# Patient Record
Sex: Female | Born: 1965 | Race: White | Hispanic: No | Smoking: Never smoker
Health system: Southern US, Community
[De-identification: ages and names within clinical notes are randomized; demographics above are authoritative.]

## PROBLEM LIST (undated history)

## (undated) DIAGNOSIS — I499 Cardiac arrhythmia, unspecified: Secondary | ICD-10-CM

## (undated) DIAGNOSIS — T4145XA Adverse effect of unspecified anesthetic, initial encounter: Secondary | ICD-10-CM

## (undated) DIAGNOSIS — G43909 Migraine, unspecified, not intractable, without status migrainosus: Secondary | ICD-10-CM

## (undated) DIAGNOSIS — M1990 Unspecified osteoarthritis, unspecified site: Secondary | ICD-10-CM

## (undated) DIAGNOSIS — J302 Other seasonal allergic rhinitis: Secondary | ICD-10-CM

## (undated) DIAGNOSIS — K219 Gastro-esophageal reflux disease without esophagitis: Secondary | ICD-10-CM

## (undated) DIAGNOSIS — B009 Herpesviral infection, unspecified: Secondary | ICD-10-CM

## (undated) DIAGNOSIS — Z8489 Family history of other specified conditions: Secondary | ICD-10-CM

## (undated) DIAGNOSIS — F419 Anxiety disorder, unspecified: Secondary | ICD-10-CM

## (undated) DIAGNOSIS — T8859XA Other complications of anesthesia, initial encounter: Secondary | ICD-10-CM

## (undated) HISTORY — PX: APPENDECTOMY: SHX54

## (undated) HISTORY — PX: OVARIAN CYST REMOVAL: SHX89

## (undated) HISTORY — DX: Herpesviral infection, unspecified: B00.9

## (undated) HISTORY — DX: Migraine, unspecified, not intractable, without status migrainosus: G43.909

## (undated) HISTORY — PX: COLONOSCOPY: SHX174

---

## 2011-06-09 ENCOUNTER — Encounter: Payer: Self-pay | Admitting: Family Medicine

## 2011-06-09 ENCOUNTER — Inpatient Hospital Stay (INDEPENDENT_AMBULATORY_CARE_PROVIDER_SITE_OTHER)
Admission: RE | Admit: 2011-06-09 | Discharge: 2011-06-09 | Disposition: A | Discharge status: Home / Self Care | Payer: PRIVATE HEALTH INSURANCE | Source: Ambulatory Visit | Attending: Family Medicine | Admitting: Family Medicine

## 2011-06-09 DIAGNOSIS — R1011 Right upper quadrant pain: Secondary | ICD-10-CM | POA: Insufficient documentation

## 2011-06-09 DIAGNOSIS — J45909 Unspecified asthma, uncomplicated: Secondary | ICD-10-CM | POA: Insufficient documentation

## 2011-06-09 DIAGNOSIS — K219 Gastro-esophageal reflux disease without esophagitis: Secondary | ICD-10-CM | POA: Insufficient documentation

## 2011-06-09 DIAGNOSIS — J309 Allergic rhinitis, unspecified: Secondary | ICD-10-CM | POA: Insufficient documentation

## 2011-06-09 LAB — CONVERTED CEMR LAB
Bilirubin Urine: NEGATIVE
Blood in Urine, dipstick: NEGATIVE
Glucose, Urine, Semiquant: NEGATIVE
Specific Gravity, Urine: >=1.03

## 2011-06-10 ENCOUNTER — Telehealth (INDEPENDENT_AMBULATORY_CARE_PROVIDER_SITE_OTHER): Payer: Self-pay | Admitting: Emergency Medicine

## 2011-11-15 NOTE — Letter (Signed)
Summary: Out of Work  [facility]  [address]   [place], [place] [address]   Phone: [phone]  Fax: [phone]    June 09, 2011   Employee:  [employer] [facility]    To Whom It May Concern:   For Medical reasons, please excuse the above named employee from work today and tomorrow.   If you need additional information, please feel free to contact our office.         Sincerely,    Donna Christen MD

## 2011-11-15 NOTE — Progress Notes (Signed)
Summary: RT SIDE PAIN (rm 5)   Vital Signs:  Patient Profile:   45 Years Old Female CC:      right upper abdominal pain x last night Height:     64 inches Weight:      169 pounds O2 Sat:      99 % O2 treatment:    Room Air Temp:     99.2 degrees F oral Pulse rate:   71 / minute Resp:     16 per minute BP sitting:   132 / 86  (left arm) Cuff size:   regular  Pt. in pain?   yes    Location:   right upper abdomen    Type:       sharp  Vitals Entered By: Lajean Saver RN (June 09, 2011 1:09 PM)                   Updated Prior Medication List: DAKINS (1/4 STRENGTH) 0.125 % SOLN (DAKINS) prn PROAIR HFA 108 (90 BASE) MCG/ACT AERS (ALBUTEROL SULFATE)  SINGULAIR 5 MG CHEW (MONTELUKAST SODIUM)  ACYCLOVIR 400 MG TABS (ACYCLOVIR) prn SERTRALINE HCL 25 MG TABS (SERTRALINE HCL)  OMEPRAZOLE 40 MG CPDR (OMEPRAZOLE) prn  Current Allergies: ! SULFAHistory of Present Illness Chief Complaint: right upper abdominal pain x last night History of Present Illness:  Subjective:  Patient complains of developing a brief episode of sharp right upper quadrant abdominal pain 2 days ago that resolved spontaneously.  At about 8-9PM last night after eating some fried fast food she developed recurrent sharp/stabbing right upper quadrant pain that lasted through the night.  The pain does not radiate, and is somewhat worse with movement and deep inspiration.  No nausea/vomiting.  Bowel movements have been loose over the past two days.  She has felt hot/cold during the past two days. She has a history of occasional GERD, improved with Prilosec, with symptoms distinctly different from present symptoms.  She denies cough or respiratory symptoms, and no GU symptoms.  REVIEW OF SYSTEMS Constitutional Symptoms      Denies fever, chills, night sweats, weight loss, weight gain, and fatigue.  Eyes       Denies change in vision, eye pain, eye discharge, glasses, contact lenses, and eye  surgery. Ear/Nose/Throat/Mouth       Denies hearing loss/aids, change in hearing, ear pain, ear discharge, dizziness, frequent runny nose, frequent nose bleeds, sinus problems, sore throat, hoarseness, and tooth pain or bleeding.  Respiratory       Complains of shortness of breath and asthma.      Denies dry cough, productive cough, wheezing, bronchitis, and emphysema/COPD.  Cardiovascular       Denies murmurs, chest pain, and tires easily with exhertion.    Gastrointestinal       Complains of stomach pain.      Denies nausea/vomiting, diarrhea, constipation, blood in bowel movements, and indigestion.      Comments: right sided pain Genitourniary       Denies painful urination, kidney stones, and loss of urinary control. Neurological       Denies paralysis, seizures, and fainting/blackouts. Musculoskeletal       Denies muscle pain, joint pain, joint stiffness, decreased range of motion, redness, swelling, muscle weakness, and gout.  Skin       Denies bruising, unusual mles/lumps or sores, and hair/skin or nail changes.  Psych       Complains of anxiety/stress.      Denies mood changes, temper/anger issues,  speech problems, depression, and sleep problems. Other Comments: Patient c/o RUQ abdominal pain x last night. The pain is relieved a little by  sitting up straight. C/o sharp stabbing pain and tingling   Past History:  Past Medical History: Allergic rhinitis Asthma GERD migraines  Past Surgical History: Appendectomy partial hysterectomy  Family History: Mom- breast CA Father- DM  Social History: Occupation: Airline pilot Never Smoked Alcohol use-no Drug use-no Smoking Status:  never Drug Use:  no   Objective:  No acute distress  Eyes:  Pupils are equal, round, and reactive to light and accomodation.  Extraocular movement is intact.  Conjunctivae are not inflamed.  Mouth/pharynx:  moist mucous membranes  Neck:  Supple.  No adenopathy is  present. Lungs:  Clear to auscultation.  Breath sounds are equal.  Heart:  Regular rate and rhythm without murmurs, rubs, or gallops.  Abdomen:  Tenderness in the right upper quadrant without masses or hepatosplenomegaly.  Murphy's sign is positive.  Bowel sounds are present.  No CVA or flank tenderness.  Extremities:  No edema.   Skin:  No rash urinalysis (dipstick): trace leuks CBC:  WBC 7.6 ; LY 29.4, MO 5.3, GR 65.3; Hgb 13.7  Assessment New Problems: RUQ PAIN (ICD-789.01) GERD (ICD-530.81) ASTHMA (ICD-493.90) ALLERGIC RHINITIS (ICD-477.9)  SUSPECT BILIARY COLIC  Plan New Orders: Urinalysis [CPT-81003] CBC w/Diff [[address]] T-CMP with estimated GFR [[address]] T-Amylase [82150-23210] T-Lipase [09811-91478] Ketorolac-Toradol 15mg  [J1885] Admin of Therapeutic Inj  intramuscular or subcutaneous [96372] New Patient Level IV [99204] Planning Comments:   Clear liquids for remainder of day then slowly advance diet Toradol 60mg  IM.  May contnue Ibuprofen at home. CMP, amylase, lipase pending. Schedule GB Ultrasound Return for worsening symptoms (or proceed to ER)   The patient and/or caregiver has been counseled thoroughly with regard to medications prescribed including dosage, schedule, interactions, rationale for use, and possible side effects and they verbalize understanding.  Diagnoses and expected course of recovery discussed and will return if not improved as expected or if the condition worsens. Patient and/or caregiver verbalized understanding.   Patient Instructions: 1)  May take Ibuprofen 200mg , 3 to 4 tabs every 8 hours with food for pain.  Medication Administration  Injection # 1:    Medication: Ketorolac-Toradol 15mg     Diagnosis: RUQ PAIN (ICD-789.01)    Route: IM    Site: RUOQ gluteus    Exp Date: 03/13/2013    Lot #: [id number]    Mfr: hospira    Comments: 60mg  given    Patient tolerated injection without complications    Given by: Lajean Saver RN  (June 09, 2011 2:15 PM)  Orders Added: 1)  Urinalysis [CPT-81003] 2)  CBC w/Diff [08657-84696] 3)  T-CMP with estimated GFR [[address]] 4)  T-Amylase [82150-23210] 5)  T-Lipase [83690-23215] 6)  Ketorolac-Toradol 15mg  [J1885] 7)  Admin of Therapeutic Inj  intramuscular or subcutaneous [96372] 8)  New Patient Level IV [99204]    Laboratory Results   Urine Tests  Date/Time Received: June 09, 2011 1:38 PM  Date/Time Reported: June 09, 2011 1:38 PM   Routine Urinalysis   Color: straw Appearance: Hazy Glucose: negative   (Normal Range: Negative) Bilirubin: negative   (Normal Range: Negative) Ketone: trace (5)   (Normal Range: Negative) Spec. Gravity: >=1.030   (Normal Range: 1.003-1.035) Blood: negative   (Normal Range: Negative) pH: 5.5   (Normal Range: 5.0-8.0) Protein: negative   (Normal Range: Negative) Urobilinogen: 0.2   (Normal Range: 0-1) Nitrite: negative   (Normal Range:  Negative) Leukocyte Esterace: trace   (Normal Range: Negative)        Appended Document: RT SIDE PAIN (rm 5) Abd U/S is normal.  No sludge, stones, thickening, or fluid.

## 2011-11-15 NOTE — Telephone Encounter (Signed)
  Phone Note Outgoing Call   Call placed by: Lavell Islam RN,  June 10, 2011 12:17 PM Call placed to: Patient Summary of Call: Left message on phone: inquiring about patient's status/ GB Ultrasound; encouraged to call us with any questions/concerns. Initial call taken by: Lavell Islam RN,  June 10, 2011 12:19 PM

## 2011-11-15 NOTE — Telephone Encounter (Signed)
  Phone Note Outgoing Call Call back at [facility] Phone [phone]   Call placed by: Emilio Math,  June 10, 2011 3:13 PM Call placed to: Patient Summary of Call: Called patient left message that Ultra sound scan was normal

## 2012-02-17 ENCOUNTER — Emergency Department (INDEPENDENT_AMBULATORY_CARE_PROVIDER_SITE_OTHER)
Admission: EM | Admit: 2012-02-17 | Discharge: 2012-02-17 | Disposition: A | Discharge status: Home Health | Payer: PRIVATE HEALTH INSURANCE | Source: Home / Self Care | Attending: Family Medicine | Admitting: Family Medicine

## 2012-02-17 ENCOUNTER — Encounter: Payer: Self-pay | Admitting: *Deleted

## 2012-02-17 DIAGNOSIS — M940 Chondrocostal junction syndrome [Tietze]: Secondary | ICD-10-CM

## 2012-02-17 DIAGNOSIS — J069 Acute upper respiratory infection, unspecified: Secondary | ICD-10-CM

## 2012-02-17 HISTORY — DX: Other seasonal allergic rhinitis: J30.2

## 2012-02-17 MED ORDER — MONTELUKAST SODIUM 10 MG PO TABS: 10.0000 mg | ORAL_TABLET | Freq: Every day | ORAL | Status: DC

## 2012-02-17 MED ORDER — BENZONATATE 200 MG PO CAPS: 200.0000 mg | ORAL_CAPSULE | Freq: Every day | ORAL | Status: AC

## 2012-02-17 MED ORDER — AZITHROMYCIN 250 MG PO TABS: ORAL_TABLET | ORAL | Status: AC

## 2012-02-17 NOTE — Discharge Instructions (Signed)
Take Mucinex D (guaifenesin with decongestant) twice daily for congestion.  Increase fluid intake, rest. May use Afrin nasal spray (or generic oxymetazoline) twice daily for about 5 days.  Also recommend using saline nasal spray several times daily and saline nasal irrigation (AYR is a common brand) Stop all antihistamines for now, and other non-prescription cough/cold preparations. May take Ibuprofen 200mg , 4 tabs every 8 hours with food for chest/sternum discomfort. Continue QVAR twice daily, and albuterol inhaler as needed.

## 2012-02-17 NOTE — ED Notes (Signed)
Pt c/o productive cough and "lungs burning" x 1 wk. She states that she is out of singulair. She has taken mucinex and tylenol multi s/s. She states that a zpak has helped in the past.

## 2012-02-17 NOTE — ED Provider Notes (Signed)
History     CSN: [id number]  Arrival date & time 02/17/12  1557   First MD Initiated Contact with Patient 02/17/12 1611      Chief Complaint  Patient presents with  . Cough     HPI Comments: Patient complains of approximately 7 day history of gradually progressive URI symptoms beginning with a mild cough followed by progressive nasal congestion, but no sore throat.  Complains of fatigue but no myalgias.  No fever but she has had chills.  She complains of tightness in her anterior chest.  Cough is now worse at night and generally non-productive during the day.  She complains of occasional shortness of breath and wheezing.  She sometimes coughs until she gags (she notes that her last tetanus shot has been within past 2 years).  She has asthma and has increased her QVAR to BID over the past several days.  She has a PRN albuterol MDI.  She ran out of Singulair about 2 months ago.  The history is provided by the patient.    Past Medical History  Diagnosis Date  . Asthma   . Seasonal allergies     Past Surgical History  Procedure Date  . Appendectomy   . Ovarian cyst removal     Family History  Problem Relation Age of Onset  . Asthma Other     History  Substance Use Topics  . Smoking status: Never Smoker   . Smokeless tobacco: Not on file  . Alcohol Use: No    OB History    Grav Para Term Preterm Abortions TAB SAB Ect Mult Living                  Review of Systems No sore throat + cough No pleuritic pain, but complains of tightness in anterior chest + occasional wheezing + nasal congestion + post-nasal drainage No sinus pain/pressure No itchy/red eyes + earache No hemoptysis + SOB with activity No fever, + chills No nausea No vomiting No abdominal pain No diarrhea No urinary symptoms No skin rashes + fatigue No myalgias + headache Used OTC meds without relief  Allergies  Sulfonamide derivatives  Home Medications   Current Outpatient Rx  Name Route  Sig Dispense Refill  . BECLOMETHASONE DIPROPIONATE 80 MCG/ACT IN AERS Inhalation Inhale 1 puff into the lungs as needed.    Marland Kitchen CETIRIZINE HCL 10 MG PO TABS Oral Take 10 mg by mouth daily.    . AZITHROMYCIN 250 MG PO TABS  Take 2 tabs today; then begin one tab once daily for 4 more days. 6 each 0  . BENZONATATE 200 MG PO CAPS Oral Take 1 capsule (200 mg total) by mouth at bedtime. Take as needed for cough 12 capsule 0  . MONTELUKAST SODIUM 10 MG PO TABS Oral Take 1 tablet (10 mg total) by mouth at bedtime. 30 tablet 0    BP 134/86  Pulse 65  Temp(Src) 98.6 F (37 C) (Oral)  Resp 16  Ht 5\' 4"  (1.626 m)  Wt 164 lb 8 oz (74.617 kg)  BMI 28.24 kg/m2  SpO2 97%  LMP 02/10/2012  Physical Exam Nursing notes and Vital Signs reviewed. Appearance:  Patient appears healthy, stated age, and in no acute distress Eyes:  Pupils are equal, round, and reactive to light and accomodation.  Extraocular movement is intact.  Conjunctivae are not inflamed  Ears:  Canals normal.  Tympanic membranes normal.  Nose:  Mildly congested turbinates.  No sinus tenderness.  Pharynx:  Normal Neck:  Supple.  Slightly tender shotty posterior nodes are palpated bilaterally  Lungs:  Clear to auscultation.  Breath sounds are equal.  Chest:  Distinct tenderness to palpation over the mid-sternum.  Heart:  Regular rate and rhythm without murmurs, rubs, or gallops.  Abdomen:  Nontender without masses or hepatosplenomegaly.  Bowel sounds are present.  No CVA or flank tenderness.  Extremities:  No edema.  No calf tenderness Skin:  No rash present.   ED Course  Procedures  none      1. Acute upper respiratory infections of unspecified site; possible bronchitis.  Pertussis less likely since her Tdap is current.  2. Costochondritis, acute       MDM  Begin a Z-pack, and Tessalon at bedtime.  Resume Singulair. Take Mucinex D (guaifenesin with decongestant) twice daily for congestion.  Increase fluid intake, rest. May  use Afrin nasal spray (or generic oxymetazoline) twice daily for about 5 days.  Also recommend using saline nasal spray several times daily and saline nasal irrigation (AYR is a common brand) Stop all antihistamines for now, and other non-prescription cough/cold preparations. May take Ibuprofen 200mg , 4 tabs every 8 hours with food for chest/sternum discomfort. Continue QVAR twice daily, and albuterol inhaler as needed. Given a Water quality scientist patient information and instruction sheet on topic costochondritis Followup with Family Doctor if not improved in one week.         Donna Christen, MD 02/17/12 [phone]

## 2012-10-20 ENCOUNTER — Ambulatory Visit (INDEPENDENT_AMBULATORY_CARE_PROVIDER_SITE_OTHER): Payer: PRIVATE HEALTH INSURANCE | Admitting: Physician Assistant

## 2012-10-20 ENCOUNTER — Encounter: Payer: Self-pay | Admitting: Physician Assistant

## 2012-10-20 VITALS — BP 117/63 | HR 72 | Temp 98.4°F | Ht 64.0 in | Wt 172.0 lb

## 2012-10-20 DIAGNOSIS — Z23 Encounter for immunization: Secondary | ICD-10-CM

## 2012-10-20 DIAGNOSIS — M25559 Pain in unspecified hip: Secondary | ICD-10-CM

## 2012-10-20 DIAGNOSIS — M2550 Pain in unspecified joint: Secondary | ICD-10-CM

## 2012-10-20 DIAGNOSIS — G43909 Migraine, unspecified, not intractable, without status migrainosus: Secondary | ICD-10-CM

## 2012-10-20 DIAGNOSIS — J329 Chronic sinusitis, unspecified: Secondary | ICD-10-CM

## 2012-10-20 DIAGNOSIS — B009 Herpesviral infection, unspecified: Secondary | ICD-10-CM

## 2012-10-20 MED ORDER — MELOXICAM 7.5 MG PO TABS: 7.5000 mg | ORAL_TABLET | Freq: Every day | ORAL | Status: DC

## 2012-10-20 MED ORDER — VALACYCLOVIR HCL 1 G PO TABS: ORAL_TABLET | ORAL | Status: DC

## 2012-10-20 MED ORDER — AMOXICILLIN-POT CLAVULANATE 875-125 MG PO TABS: 1.0000 | ORAL_TABLET | Freq: Two times a day (BID) | ORAL | Status: DC

## 2012-10-20 MED ORDER — ACYCLOVIR 5 % EX OINT: TOPICAL_OINTMENT | CUTANEOUS | Status: DC

## 2012-10-20 NOTE — Progress Notes (Signed)
Subjective:    Patient ID: Angela Oliver, female    DOB: 12-18-1965, 46 y.o.   MRN: [id number]  HPI Patient is a 46 yo female who presents to the clinic to establish care and follow up on ongoing health issues. PMH positive for asthma, allergies, herpes simplex with reccurrent outbreaks, Seasonal allergies, migraines, GERD.   Asthma is well controlled on Qvar 2 puffs twice a day. She rarely has to use rescue inhaler. Has not received Pneumonia or Flu vaccine.   Allergies are treated with Zyrtec. She has had sinus pressure and congestion for over 2 weeks. She feels a lot of pressure around eyes. She has tried treating allergies but not her throat hurts and glands feel tender. NO fever, chills, SOB, cough. Denies ear pain. She feels like she has had a continual headache for 2 weeks. She has tried OTC sinus/tylenol.   Herpes simplex: She has outbreak of herpes today on her upper lip. She had acyclovir but it did not help. She has been extremely stressed and not feeling well.   Right hip pain for about 2 weeks. She does not remember any trauma to the area. It hurts even to lay on her right side. She has never had anything like this before. She denies any back pain or radiation of pain down leg. NO numbness or tingling of extremities. She has not tried anything to make better. Walking and laying on it makes worse.   She has a previous diagnosis of plantar faciitis. She wore orthotics for years. She has not worn in a year or so. Both of her feet have a lot of pain espesically since she has had to walk more. She has been told she has heel spurs in the past also. She has been taking aleve and ibuprofen with some relief.   Polyarthralgia join pain of hands. She does have a family history of RA with her grandmother. The pain in her hands is worse in the morning and gets better throughout the day. Her grip is even affected. She feels stiff. Never been checked for RA.    Review of Systems  All other  systems reviewed and are negative.       Objective:   Physical Exam  Constitutional: She is oriented to person, place, and time. She appears well-developed and well-nourished.       Overweight.  HENT:  Head: Normocephalic and atraumatic.  Right Ear: External ear normal.  Left Ear: External ear normal.       TM's normal bilaterally.   Oropharynx erythematous with no swelling. Nasal turbinates red and swollen.  Maxillary tenderness over maxillary sinuses.   Eyes: Conjunctivae normal are normal. Right eye exhibits no discharge. Left eye exhibits no discharge.  Neck: Normal range of motion. Neck supple.       Bilateral tender cervical adenopathy that is tender to touch.   Cardiovascular: Normal rate, regular rhythm, normal heart sounds and intact distal pulses.   Pulmonary/Chest: Effort normal and breath sounds normal. She has no wheezes.  Musculoskeletal:       Bilateral hands: Positive squeeze test with left hand more positive than right. No single joint swelling but all the MCP and PIP joints appear to be swollen. PIP joints are tender to touch with palpation. Strength is still 5/5.    Bilateral Feet: Pain with standing on flat feet. Tenderness with palpation over heel of both feet and on lateral side of left foot. Pedal pulses 2+. No color changes, signs of  swelling or bruising. No pain with active plantar or doral flexion and extension.    Right hip: ROM limited by pain with internal rotation and abduction. Pinpoint tenderness over right bursa. No swelling or erythema. Negative straight leg test. Strength 5/5.   Lymphadenopathy:    She has cervical adenopathy.  Neurological: She is alert and oriented to person, place, and time. She has normal reflexes. Coordination normal.  Skin: Skin is warm and dry.       Swollen upper lip with crusted lesions with minimal oozing discharge.  Psychiatric: She has a normal mood and affect. Her behavior is normal.          Assessment &  Plan:  Right hip bursitis-  Aspiration/Injection Procedure Note Angela Oliver [id number] Aug 27, 1966  Procedure: Injection Indications: right hip bursa pain.  Procedure Details Consent: Risks of procedure as well as the alternatives and risks of each were explained to the (patient/caregiver).  Consent for procedure obtained. Time Out: Verified patient identification, verified procedure, site/side was marked, verified correct patient position, special equipment/implants available, medications/allergies/relevent history reviewed, required imaging and test results available.  Performed   Local Anesthesia Used:Ethyl Chloride Spray Area prepped with beta iodine. Injection of lidocaine 1% and depo medrol 40mg  was administered to the right hip bursa.  A sterile dressing was applied.  Patient did tolerate procedure well. Estimated blood loss: none  Patient also given stretches and symptomatic care handout. Mobic will also help with this along with icing area. Follow up if not improving.   Sinusitis- Augmentin given for 10 days. Symptomatic care discussed. Follow up if not improving.   Plantar fasciitis- Gave patient handout with stretches and home treatment. Also gave mobic to use for 2 weeks and then PRN for all joint pain. Discussed icing area with frozen water bottle. Good foot support is very important patient has had orthotics in the past. I will get her in with Dr. Karie Schwalbe and he can get her more orthotics. In the meantime encouraged patient to wear very supportive shoes.   Polyarthralgia/joint pain- Joint pain localized in hands will get hand x-rays. Clinical hx sounds suspicious of RA. Will test for factor and anti-CCP. In the mean time lets start with symptomatic care with anti-inflammatories. I discussed with patient that we would start one joint at a time to take care of pain as best we could.   Herpes simplex/Cold sores- Since acyclovir not working gave Valtrex high dose to knock out for 1 day.  Also gave Zovirax cream to use for next 2 days. Pt educated to stop acyclovir before starting Valtrex. Discussed preventive care if continues to reoccur. Patient will think about this and see if that is something she is interested int.   Asthma- Well-controlled currently never had pneumonia injection was given today with no complications.   Flu shot was also given with no complications.   Needs mammogram and other health manitence blood work today but we follow up and will get labs order after we address acute problems.     60 minutes was spent with patient and greater than 60% was spent with patient going over plan to coordinate care.

## 2012-10-20 NOTE — Patient Instructions (Addendum)
Follow up in 2 weeks. Will refer for consult with Dr. Karie Schwalbe for orthotics/plantar faciitis/right hip bursitis. Start mobic 7.5mg  daily and may increase to 2 if no relief after 2 weeks can use as needed. See stretches.   Plantar Fasciitis Plantar fasciitis is a common condition that causes foot pain. It is soreness (inflammation) of the band of tough fibrous tissue on the bottom of the foot that runs from the heel bone (calcaneus) to the ball of the foot. The cause of this soreness may be from excessive standing, poor fitting shoes, running on hard surfaces, being overweight, having an abnormal walk, or overuse (this is common in runners) of the painful foot or feet. It is also common in aerobic exercise dancers and ballet dancers. SYMPTOMS  Most people with plantar fasciitis complain of:  Severe pain in the morning on the bottom of their foot especially when taking the first steps out of bed. This pain recedes after a few minutes of walking.  Severe pain is experienced also during walking following a long period of inactivity.  Pain is worse when walking barefoot or up stairs DIAGNOSIS   Your caregiver will diagnose this condition by examining and feeling your foot.  Special tests such as X-rays of your foot, are usually not needed. PREVENTION   Consult a sports medicine professional before beginning a new exercise program.  Walking programs offer a good workout. With walking there is a lower chance of overuse injuries common to runners. There is less impact and less jarring of the joints.  Begin all new exercise programs slowly. If problems or pain develop, decrease the amount of time or distance until you are at a comfortable level.  Wear good shoes and replace them regularly.  Stretch your foot and the heel cords at the back of the ankle (Achilles tendon) both before and after exercise.  Run or exercise on even surfaces that are not hard. For example, asphalt is better than  pavement.  Do not run barefoot on hard surfaces.  If using a treadmill, vary the incline.  Do not continue to workout if you have foot or joint problems. Seek professional help if they do not improve. HOME CARE INSTRUCTIONS   Avoid activities that cause you pain until you recover.  Use ice or cold packs on the problem or painful areas after working out.  Only take over-the-counter or prescription medicines for pain, discomfort, or fever as directed by your caregiver.  Soft shoe inserts or athletic shoes with air or gel sole cushions may be helpful.  If problems continue or become more severe, consult a sports medicine caregiver or your own health care provider. Cortisone is a potent anti-inflammatory medication that may be injected into the painful area. You can discuss this treatment with your caregiver. MAKE SURE YOU:   Understand these instructions.  Will watch your condition.  Will get help right away if you are not doing well or get worse. Document Released: 08/24/2001 Document Revised: 02/21/2012 Document Reviewed: 10/23/2008 [facility] Patient Information 2013 [place], [place].

## 2012-10-23 DIAGNOSIS — G43909 Migraine, unspecified, not intractable, without status migrainosus: Secondary | ICD-10-CM | POA: Insufficient documentation

## 2012-10-23 DIAGNOSIS — B001 Herpesviral vesicular dermatitis: Secondary | ICD-10-CM | POA: Insufficient documentation

## 2012-10-23 DIAGNOSIS — B009 Herpesviral infection, unspecified: Secondary | ICD-10-CM | POA: Insufficient documentation

## 2012-10-23 LAB — RHEUMATOID FACTOR: Rhuematoid fact SerPl-aCnc: <10 IU/mL (ref <=14)

## 2012-11-03 ENCOUNTER — Other Ambulatory Visit: Payer: Self-pay | Admitting: Physician Assistant

## 2012-11-03 ENCOUNTER — Encounter: Payer: Self-pay | Admitting: Sports Medicine

## 2012-11-03 ENCOUNTER — Ambulatory Visit (INDEPENDENT_AMBULATORY_CARE_PROVIDER_SITE_OTHER): Payer: PRIVATE HEALTH INSURANCE

## 2012-11-03 ENCOUNTER — Other Ambulatory Visit: Payer: Self-pay | Admitting: Sports Medicine

## 2012-11-03 ENCOUNTER — Ambulatory Visit (INDEPENDENT_AMBULATORY_CARE_PROVIDER_SITE_OTHER): Payer: PRIVATE HEALTH INSURANCE | Admitting: Sports Medicine

## 2012-11-03 VITALS — BP 122/80 | HR 75 | Wt 173.0 lb

## 2012-11-03 DIAGNOSIS — M7060 Trochanteric bursitis, unspecified hip: Secondary | ICD-10-CM

## 2012-11-03 DIAGNOSIS — M7061 Trochanteric bursitis, right hip: Secondary | ICD-10-CM | POA: Insufficient documentation

## 2012-11-03 DIAGNOSIS — R269 Unspecified abnormalities of gait and mobility: Secondary | ICD-10-CM

## 2012-11-03 DIAGNOSIS — M722 Plantar fascial fibromatosis: Secondary | ICD-10-CM | POA: Insufficient documentation

## 2012-11-03 DIAGNOSIS — R609 Edema, unspecified: Secondary | ICD-10-CM

## 2012-11-03 DIAGNOSIS — M25649 Stiffness of unspecified hand, not elsewhere classified: Secondary | ICD-10-CM

## 2012-11-03 DIAGNOSIS — M2550 Pain in unspecified joint: Secondary | ICD-10-CM

## 2012-11-03 DIAGNOSIS — M62838 Other muscle spasm: Secondary | ICD-10-CM | POA: Insufficient documentation

## 2012-11-03 DIAGNOSIS — M76899 Other specified enthesopathies of unspecified lower limb, excluding foot: Secondary | ICD-10-CM

## 2012-11-03 MED ORDER — CYCLOBENZAPRINE HCL 10 MG PO TABS: ORAL_TABLET | ORAL | Status: DC

## 2012-11-03 NOTE — Progress Notes (Signed)
SPORTS MEDICINE CONSULTATION REPORT  Subjective:    I'm seeing this patient as a consultation for:  Tandy Gaw, PA-C  CC: Multiple complaints  HPI: Foot pain: Present for a long time, localized to the heel insertion of the plantar fascia, particularly of the left foot. Worse with the first few steps in the morning, gets better through the day. It is localized, doesn't radiate, is severe. She did have some orthotics in the past of worn out, and desires an additional set.  Trochanteric bursitis: Injected by Lesly Rubenstein 2 weeks ago, symptoms have resolved on the right side, are mild on the left side. She's not doing any form of rehabilitation exercises yet for this.  Neck pain: Stretched, and developed some spasm and pain along the midline of her paracervical muscles. Any movement worsens the pain, she does not get any radicular symptoms. It is severe. Localized, does not radiate  Past medical history, Surgical history, Family history, Social history, Allergies, and medications have been entered into the medical record, reviewed, and no changes needed.   Review of Systems: No headache, visual changes, nausea, vomiting, diarrhea, constipation, dizziness, abdominal pain, skin rash, fevers, chills, night sweats, weight loss, swollen lymph nodes, body aches, joint swelling, muscle aches, chest pain, or shortness of breath.   Objective:   Vitals:  Afebrile, vital signs stable. General: Well Developed, well nourished, and in no acute distress.  Neuro/Psych: Alert and oriented x3, extra-ocular muscles intact, able to move all 4 extremities.  Skin: Warm and dry, no rashes noted.  Respiratory: Not using accessory muscles, speaking in full sentences, trachea midline.  Cardiovascular: Pulses palpable, no extremity edema. Abdomen: Does not appear distended. Neck: Inspection unremarkable. No palpable stepoffs. Tender to palpation along the right paracervical muscles. Negative Spurling's maneuver. Full  neck range of motion Grip strength and sensation normal in bilateral hands Strength good C4 to T1 distribution No sensory change to C4 to T1 Negative Hoffman sign bilaterally Reflexes normal  Bilateral Hip: ROM IR: 45 Deg, ER: 45 Deg, Flexion: 120 Deg, Extension: 100 Deg, Abduction: 45 Deg, Adduction: 45 Deg Strength IR: 5/5, ER: 5/5, Flexion: 5/5, Extension: 5/5, Abduction: 5/5, Adduction: 5/5 Pelvic alignment unremarkable to inspection and palpation. Standing hip rotation and gait without trendelenburg sign / unsteadiness. Tenderness to palpation over right and left greater trochanters. No pain with FABER or FADIR. No SI joint tenderness and normal minimal SI movement.  Bilateral pes planus. Leg lengths equal. Hip abductor strength markedly weak on the right side, normal on the left. Tender to palpation the calcaneal insertion of plantar fascia on the left foot.  Patient was fitted for a : standard, cushioned, semi-rigid orthotic. The orthotic was heated and afterward the patient stood on the orthotic blank positioned on the orthotic stand. The patient was positioned in subtalar neutral position and 10 degrees of ankle dorsiflexion in a weight bearing stance. After completion of molding, a stable base was applied to the orthotic blank. The blank was ground to a stable position for weight bearing. Size: 8 Base: Blue EVA Additional Posting and Padding: None The patient ambulated these, and they were very comfortable.  Impression and Recommendations:   This case required medical decision making of moderate complexity.

## 2012-11-03 NOTE — Assessment & Plan Note (Signed)
Increase Mobic to 15 mg daily. Neck rehabilitation. Short course of Flexeril.

## 2012-11-03 NOTE — Assessment & Plan Note (Signed)
Aggressive home rehabilitation program, handout given. Orthotics made today. Consider ultrasound guided injection if no better.

## 2012-11-03 NOTE — Assessment & Plan Note (Signed)
At 15 mg daily. Hip abductor rehabilitation

## 2012-11-03 NOTE — Patient Instructions (Signed)
Hip Rehabilitation Protocol:  1.  Side leg raises.  3x30 with no weight, then 3x15 with 2 lb ankle weight, then 3x15 with 5 lb ankle weight 2.  Standing hip rotation.  3x30 with no weight, then 3x15 with 2 lb ankle weight, then 3x15 with 5 lb ankle weight. 3.  Side step ups.  3x30 with no weight, then 3x15 with 5 lbs in backpack, then 3x15 with 10 lbs in backpack. 

## 2012-11-27 ENCOUNTER — Telehealth: Payer: Self-pay | Admitting: Family Medicine

## 2012-11-27 MED ORDER — SERTRALINE HCL 25 MG PO TABS: 25.0000 mg | ORAL_TABLET | Freq: Every day | ORAL | Status: DC

## 2012-11-27 MED ORDER — MONTELUKAST SODIUM 10 MG PO TABS: 10.0000 mg | ORAL_TABLET | Freq: Every day | ORAL | Status: DC

## 2012-11-27 MED ORDER — SUMATRIPTAN SUCCINATE 50 MG PO TABS: 50.0000 mg | ORAL_TABLET | ORAL | Status: DC | PRN

## 2012-11-27 MED ORDER — MELOXICAM 7.5 MG PO TABS: 15.0000 mg | ORAL_TABLET | Freq: Every day | ORAL | Status: DC

## 2012-11-27 NOTE — Telephone Encounter (Signed)
Patient walked-in request to have meds refilled. Montelukast SOD 10 Mg, Sumatriptan SUCC 50 Mg, Sertraline HCL 25mg , Meloxicam 7.5 mg (Pt adv that Dr. Karie Schwalbe had her double up this med) and Butalbit-Acetminophen-Caff cp

## 2012-11-27 NOTE — Telephone Encounter (Signed)
Sent over all rx except butabital/acetiminophen/Caffiene. Have I ever given this to you? Where have you gotten in the past?

## 2012-11-27 NOTE — Telephone Encounter (Signed)
[facility] on [address]

## 2012-11-28 MED ORDER — BUTALBITAL-ASA-CAFFEINE 50-325-40 MG PO CAPS: 1.0000 | ORAL_CAPSULE | Freq: Two times a day (BID) | ORAL | Status: DC | PRN

## 2012-11-28 NOTE — Telephone Encounter (Signed)
Pt notified and instructed to call back as to where she was getting the butalbital from

## 2012-11-28 NOTE — Addendum Note (Signed)
Addended by: Nani Gasser D on: 11/28/2012 05:03 PM   Modules accepted: Orders

## 2012-11-28 NOTE — Telephone Encounter (Signed)
She received the butalbital originally from her last primary doctor, Dr Tinnie Gens. She takes the medication at the onset of a migraine.

## 2012-11-28 NOTE — Telephone Encounter (Signed)
rx sent fiorinal.

## 2012-11-29 ENCOUNTER — Other Ambulatory Visit: Payer: Self-pay | Admitting: *Deleted

## 2012-12-01 ENCOUNTER — Encounter: Payer: Self-pay | Admitting: Sports Medicine

## 2012-12-01 ENCOUNTER — Ambulatory Visit (INDEPENDENT_AMBULATORY_CARE_PROVIDER_SITE_OTHER): Payer: PRIVATE HEALTH INSURANCE | Admitting: Sports Medicine

## 2012-12-01 VITALS — BP 128/82 | HR 86 | Wt 174.0 lb

## 2012-12-01 DIAGNOSIS — M722 Plantar fascial fibromatosis: Secondary | ICD-10-CM

## 2012-12-01 MED ORDER — MELOXICAM 15 MG PO TABS: 15.0000 mg | ORAL_TABLET | Freq: Every day | ORAL | Status: DC

## 2012-12-01 NOTE — Progress Notes (Signed)
SPORTS MEDICINE CONSULTATION REPORT  Subjective:    CC: Followup  HPI: Plantar fasciitis: Bilateral, worse on the left side. She has been doing on an intermittent basis the home rehabilitation exercises. I also built her custom orthotics.tion exercises,overall she is better on the right side but still with symptoms on the left. Pain is localized to the calcaneal insertion of the plantar fascia medially, worse in the morning, better with rest. Symptoms are severe.   Past medical history, Surgical history, Family history, Social history, Allergies, and medications have been entered into the medical record, reviewed, and no changes needed.   Review of Systems: No headache, visual changes, nausea, vomiting, diarrhea, constipation, dizziness, abdominal pain, skin rash, fevers, chills, night sweats, weight loss, swollen lymph nodes, body aches, joint swelling, muscle aches, chest pain, shortness of breath, mood changes, visual or auditory hallucinations.   Objective:   Vitals:  Afebrile, vital signs stable. General: Well Developed, well nourished, and in no acute distress.  Neuro/Psych: Alert and oriented x3, extra-ocular muscles intact, able to move all 4 extremities.  Skin: Warm and dry, no rashes noted.  Respiratory: Not using accessory muscles, speaking in full sentences, trachea midline.  Cardiovascular: Pulses palpable, no extremity edema. Abdomen: Does not appear distended. Orthotics are in good shape, she continues to be tender to palpation at the medial calcaneal insertion of the plantar fascia on the left foot.   Procedure: Real-time Ultrasound Guided Injection of left plantar fascia Device: GE Logiq E  Ultrasound guided injection is preferred based studies that show increased duration, increased effect, greater accuracy, decreased procedural pain, increased response rate, and decreased cost with ultrasound guided versus blind injection.  Verbal informed consent obtained.  Time-out  conducted.  Noted no overlying erythema, induration, or other signs of local infection.  Skin prepped in a sterile fashion.  Local anesthesia: Topical Ethyl chloride.  With sterile technique and under real time ultrasound guidance:  Needle advanced and real-time guidance just deep to the calcaneal insertion of the plantar fascia. 1 cc Kenalog 40, 3 cc lidocaine injected. Completed without difficulty  Pain immediately resolved suggesting accurate placement of the medication.  Advised to call if fevers/chills, erythema, induration, drainage, or persistent bleeding.  Images permanently stored and available for review in the ultrasound unit.  Impression: Technically successful ultrasound guided injection.  Air heel brace placed in left side..  Impression and Recommendations:   This case required medical decision making of moderate complexity.

## 2012-12-01 NOTE — Assessment & Plan Note (Signed)
Right side improved significantly with orthotics and rehabilitation exercises. Left side still painful, ultrasound guided injection as above. Continue exercises, orthotics, and back to see me in 4 weeks.

## 2012-12-29 ENCOUNTER — Encounter: Payer: Self-pay | Admitting: Sports Medicine

## 2012-12-29 ENCOUNTER — Ambulatory Visit (INDEPENDENT_AMBULATORY_CARE_PROVIDER_SITE_OTHER): Payer: PRIVATE HEALTH INSURANCE | Admitting: Sports Medicine

## 2012-12-29 VITALS — BP 136/93 | HR 84 | Wt 178.0 lb

## 2012-12-29 DIAGNOSIS — M722 Plantar fascial fibromatosis: Secondary | ICD-10-CM

## 2012-12-29 DIAGNOSIS — M7750 Other enthesopathy of unspecified foot: Secondary | ICD-10-CM

## 2012-12-29 DIAGNOSIS — M7672 Peroneal tendinitis, left leg: Secondary | ICD-10-CM | POA: Insufficient documentation

## 2012-12-29 NOTE — Assessment & Plan Note (Signed)
Guided injection as above. Home rehabilitation. Continue Mobic as needed. Return to clinic in 4 weeks to reassess.

## 2012-12-29 NOTE — Progress Notes (Signed)
SPORTS MEDICINE CONSULTATION REPORT  Subjective:    CC: Follow  HPI: Plantar fasciitis: Resolved on the right side, still present on the left side. She has already had rehabilitation exercises, ultrasound guided injection, custom orthotics. Overall is better but the pain is still present. She does desire to have some orthotics made with a digital mold. I think that this is appropriate.  Left Ankle pain: She now localizes this for several weeks behind and distal to the lateral malleolus. Worse with eversion of the foot. Moderate. She desires that I injected this today.  Past medical history, Surgical history, Family history not pertinant except as noted below, Social history, Allergies, and medications have been entered into the medical record, reviewed, and no changes needed.   Review of Systems: No headache, visual changes, nausea, vomiting, diarrhea, constipation, dizziness, abdominal pain, skin rash, fevers, chills, night sweats, weight loss, swollen lymph nodes, body aches, joint swelling, muscle aches, chest pain, shortness of breath, mood changes, visual or auditory hallucinations.   Objective:   Vitals:  Afebrile, vital signs stable. General: Well Developed, well nourished, and in no acute distress.  Neuro/Psych: Alert and oriented x3, extra-ocular muscles intact, able to move all 4 extremities, sensation grossly intact. Skin: Warm and dry, no rashes noted.  Respiratory: Not using accessory muscles, speaking in full sentences, trachea midline.  Cardiovascular: Pulses palpable, no extremity edema. Abdomen: Does not appear distended. Left Ankle: No visible erythema or swelling. Range of motion is full in all directions. Strength is 5/5 in all directions. Stable lateral and medial ligaments; squeeze test and kleiger test unremarkable; Talar dome nontender; No pain at base of 5th MT; No tenderness over cuboid; No tenderness over N spot or navicular prominence Tender to palpation  behind the lateral malleolus, and along the peroneal tendon, predominately the peroneus brevis. Pain with resisted eversion. Negative tarsal tunnel Tinel's. 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 Able to walk 4 steps.  Procedure: Real-time Ultrasound Guided Injection of peroneal tendon sheath. Device: GE Logiq E  Ultrasound guided injection is preferred based studies that show increased duration, increased effect, greater accuracy, decreased procedural pain, increased response rate, and decreased cost with ultrasound guided versus blind injection.  Verbal informed consent obtained.  Time-out conducted.  Noted no overlying erythema, induration, or other signs of local infection.  Skin prepped in a sterile fashion.  Local anesthesia: Topical Ethyl chloride.  With sterile technique and under real time ultrasound guidance:  Needle advanced and short axis between the peroneus longus and brevis just distal to the lateral malleolus. 1 cc Kenalog 40, 3 cc lidocaine injected easily and the tendon sheath was seen filling with medication. Completed without difficulty  Pain immediately resolved suggesting accurate placement of the medication.  Advised to call if fevers/chills, erythema, induration, drainage, or persistent bleeding.  Images permanently stored and available for review in the ultrasound unit.  Impression: Technically successful ultrasound guided injection.  Impression and Recommendations:   This case required medical decision making of moderate complexity.

## 2012-12-29 NOTE — Assessment & Plan Note (Addendum)
Improved, left side still painful status post injection, ear heel brace, and custom orthotics. I think it appropriate for her to see a CPO for consideration of digital orthotics. I have also recommended she obtain an arch bandage over the counter which is highly effective in supporting the mid-plantar fascia. She should also perform massage over the mid plantar fascia over the fibromatosis. Unfortunately I think she will eventually need surgical intervention.

## 2013-01-01 ENCOUNTER — Ambulatory Visit (INDEPENDENT_AMBULATORY_CARE_PROVIDER_SITE_OTHER): Payer: PRIVATE HEALTH INSURANCE

## 2013-01-01 ENCOUNTER — Ambulatory Visit: Payer: PRIVATE HEALTH INSURANCE

## 2013-01-01 ENCOUNTER — Other Ambulatory Visit: Payer: Self-pay | Admitting: Family Medicine

## 2013-01-01 ENCOUNTER — Other Ambulatory Visit: Payer: PRIVATE HEALTH INSURANCE

## 2013-01-01 ENCOUNTER — Ambulatory Visit (INDEPENDENT_AMBULATORY_CARE_PROVIDER_SITE_OTHER): Payer: PRIVATE HEALTH INSURANCE | Admitting: Family Medicine

## 2013-01-01 ENCOUNTER — Encounter: Payer: Self-pay | Admitting: Family Medicine

## 2013-01-01 VITALS — BP 126/81 | HR 76 | Resp 18 | Wt 179.0 lb

## 2013-01-01 DIAGNOSIS — M546 Pain in thoracic spine: Secondary | ICD-10-CM

## 2013-01-01 MED ORDER — CYCLOBENZAPRINE HCL 10 MG PO TABS: 5.0000 mg | ORAL_TABLET | Freq: Every evening | ORAL | Status: DC | PRN

## 2013-01-01 NOTE — Progress Notes (Signed)
Subjective:    Patient ID: Angela Oliver, female    DOB: 03-25-1966, 47 y.o.   MRN: [id number]  HPI Upper Back Pain x 3 moths.  Says has always has had a sensitivity in that area. Does do a lot of heavy lifting for her job.  Says feels so painful as times feels like something is going to break.  Painful ot sleep on her side, makes it worse.  Pillow can make it worse.  Feels like a hot poker into her skin.  Now when bend over and tie her shoes she feels like it takes her breath away. Last xrays were in 1995.  Sometimes arms will go numb and tiingle when asleep at night but not during the daytime. Has a lot fo tension in her upper back.  Friend will sometimes massage the muscle but says the bone is tender.  No old injuries. Mobic helped in the beginning but not really now.  Has been using ice and heat on it.    She is also here to establish care with me today. She was initially seen by Tandy Gaw, PA. She wanted to see me for her primary care provider since I do see her parents. They were unable to get her in with me initially, so she is now establishing with me.   Review of Systems BP 126/81  Pulse 76  Resp 18  Wt 179 lb (81.194 kg)  SpO2 97%    Allergies  Allergen Reactions  . Sulfonamide Derivatives     Past Medical History  Diagnosis Date  . Asthma   . Seasonal allergies   . Herpes simplex   . Migraines     Past Surgical History  Procedure Date  . Appendectomy   . Ovarian cyst removal     History   Social History  . Marital Status: Married    Spouse Name: N/A    Number of Children: N/A  . Years of Education: N/A   Occupational History  . Not on file.   Social History Main Topics  . Smoking status: Never Smoker   . Smokeless tobacco: Not on file  . Alcohol Use: No  . Drug Use: No  . Sexually Active:    Other Topics Concern  . Not on file   Social History Narrative  . No narrative on file    Family History  Problem Relation Age of Onset  . Asthma Other    . Cancer Mother     breast cancer  . Alcohol abuse Father   . Diabetes Father   . Hyperlipidemia Father     Outpatient Encounter Prescriptions as of 01/01/2013  Medication Sig Dispense Refill  . acyclovir (ZOVIRAX) 400 MG tablet Take 400 mg by mouth 2 (two) times daily.      Marland Kitchen acyclovir ointment (ZOVIRAX) 5 % Apply topically every 3 (three) hours.  15 g  1  . albuterol (PROVENTIL HFA;VENTOLIN HFA) 108 (90 BASE) MCG/ACT inhaler Inhale 2 puffs into the lungs as needed.      . beclomethasone (QVAR) 80 MCG/ACT inhaler Inhale 2 puffs into the lungs 2 (two) times daily.       . butalbital-aspirin-caffeine (FIORINAL) 50-325-40 MG per capsule Take 1 capsule by mouth 2 (two) times daily as needed for headache.  14 capsule  0  . cetirizine (ZYRTEC) 10 MG tablet Take 10 mg by mouth daily.      . cyclobenzaprine (FLEXERIL) 10 MG tablet One half tab PO qHS, then increase gradually  to one tab TID.  30 tablet  0  . levonorgestrel-ethinyl estradiol (NORDETTE) 0.15-30 MG-MCG tablet Take 1 tablet by mouth daily.      . meloxicam (MOBIC) 15 MG tablet Take 1 tablet (15 mg total) by mouth daily. Take 1-2 tabs everyday for 2 weeks then as needed for joint pain.  90 tablet  3  . montelukast (SINGULAIR) 10 MG tablet Take 1 tablet (10 mg total) by mouth at bedtime.  30 tablet  11  . sertraline (ZOLOFT) 25 MG tablet Take 1 tablet (25 mg total) by mouth daily.  30 tablet  3  . SUMAtriptan (IMITREX) 50 MG tablet Take 1 tablet (50 mg total) by mouth every 2 (two) hours as needed.  10 tablet  1  . valACYclovir (VALTREX) 1000 MG tablet 2 tabs twice a day for one day.  4 tablet  0  . cyclobenzaprine (FLEXERIL) 10 MG tablet Take 0.5-1 tablets (5-10 mg total) by mouth at bedtime as needed for muscle spasms.  30 tablet  0  . [DISCONTINUED] amoxicillin-clavulanate (AUGMENTIN) 875-125 MG per tablet Take 1 tablet by mouth 2 (two) times daily. For 10 days.  20 tablet  0          Objective:   Physical Exam  Constitutional:  She is oriented to person, place, and time. She appears well-developed and well-nourished.  HENT:  Head: Normocephalic and atraumatic.  Musculoskeletal:       NOrmal flexion of the neck, normal extension, dec flexion to the right, and dec side bending to the right. Nontender over the cervical spine. Upper shin knees with normal range of motion. Normal crossover. She said her touch her back. Shoulder, elbow, wrist, finger strength is 5 out of 5 in both extremities. The she may reflexes 2+ bilaterally.  Neurological: She is alert and oriented to person, place, and time. She has normal reflexes.  Skin: Skin is warm and dry.  Psychiatric: She has a normal mood and affect. Her behavior is normal.          Assessment & Plan:  Upper back pain - Will tx with NSAID, muscle relaxer, PT.  Will also get xrays to eval the thoracic spine, since  has bony tenderness over the midthoracic spine. We'll call her with results. Consider that she may also have some disc herniation. if her x-rays are fairly normal or only shows some mild arthritis then recommend physical therapy and if not improving then consider further imaging such as MRI. She does do heavy lifting in her job and this may continue to be problematic for her in her ability to heal and recover.

## 2013-01-03 ENCOUNTER — Encounter: Payer: Self-pay | Admitting: *Deleted

## 2013-01-25 ENCOUNTER — Ambulatory Visit: Payer: PRIVATE HEALTH INSURANCE | Admitting: Sports Medicine

## 2013-01-30 ENCOUNTER — Ambulatory Visit: Payer: PRIVATE HEALTH INSURANCE | Admitting: Family Medicine

## 2013-01-30 DIAGNOSIS — Z0289 Encounter for other administrative examinations: Secondary | ICD-10-CM

## 2013-02-17 ENCOUNTER — Other Ambulatory Visit: Payer: Self-pay | Admitting: Family Medicine

## 2013-03-02 ENCOUNTER — Encounter: Payer: Self-pay | Admitting: Sports Medicine

## 2013-03-02 ENCOUNTER — Ambulatory Visit (INDEPENDENT_AMBULATORY_CARE_PROVIDER_SITE_OTHER): Payer: PRIVATE HEALTH INSURANCE | Admitting: Sports Medicine

## 2013-03-02 VITALS — BP 130/83 | HR 68 | Wt 180.0 lb

## 2013-03-02 DIAGNOSIS — M7672 Peroneal tendinitis, left leg: Secondary | ICD-10-CM

## 2013-03-02 DIAGNOSIS — M722 Plantar fascial fibromatosis: Secondary | ICD-10-CM

## 2013-03-02 NOTE — Assessment & Plan Note (Signed)
Resolved after guided sheath injection.

## 2013-03-02 NOTE — Assessment & Plan Note (Addendum)
Left plantar fascia injection as above. Patient desires digitally created orthotics, I have placed a referral to orthotist that she has selected.

## 2013-03-02 NOTE — Progress Notes (Signed)
  Subjective:    CC: Left heel pain  HPI:  Angela Oliver comes back, she has a recurrence of pain which he localizes over her left calcaneus. I injected her left plantar fascia 3 months ago, and her left peroneal tendon sheath 2 months ago. Peroneal symptoms are resolved. She desires a repeat injection, he continues to be localizes above without radiation, severe. Worse in the morning.  Past medical history, Surgical history, Family history not pertinant except as noted below, Social history, Allergies, and medications have been entered into the medical record, reviewed, and no changes needed.   Review of Systems: No headache, visual changes, nausea, vomiting, diarrhea, constipation, dizziness, abdominal pain, skin rash, fevers, chills, night sweats, weight loss, swollen lymph nodes, body aches, joint swelling, muscle aches, chest pain, shortness of breath, mood changes, visual or auditory hallucinations.   Objective:   General: Well Developed, well nourished, and in no acute distress.  Neuro/Psych: Alert and oriented x3, extra-ocular muscles intact, able to move all 4 extremities, sensation grossly intact. Skin: Warm and dry, no rashes noted.  Respiratory: Not using accessory muscles, speaking in full sentences, trachea midline.  Cardiovascular: Pulses palpable, no extremity edema. Abdomen: Does not appear distended. Left foot: Tender to palpation of the calcaneal insertion of the plantar fascia.  Procedure: Real-time Ultrasound Guided Injection of left plantar fascia Device: GE Logiq E  Ultrasound guided injection is preferred based studies that show increased duration, increased effect, greater accuracy, decreased procedural pain, increased response rate, and decreased cost with ultrasound guided versus blind injection.  Verbal informed consent obtained.  Time-out conducted.  Noted no overlying erythema, induration, or other signs of local infection.  Skin prepped in a sterile fashion.  Local  anesthesia: Topical Ethyl chloride.  With sterile technique and under real time ultrasound guidance:  25-gauge needle advanced just deep to the calcaneal insertion of the plantar fascia, 1 cc Kenalog 40, 2 cc lidocaine injected easily. Completed without difficulty  Pain immediately resolved suggesting accurate placement of the medication.  Advised to call if fevers/chills, erythema, induration, drainage, or persistent bleeding.  Images permanently stored and available for review in the ultrasound unit.  Impression: Technically successful ultrasound guided injection.  Impression and Recommendations:   This case required medical decision making of moderate complexity.

## 2013-03-23 ENCOUNTER — Ambulatory Visit (INDEPENDENT_AMBULATORY_CARE_PROVIDER_SITE_OTHER): Payer: PRIVATE HEALTH INSURANCE | Admitting: Sports Medicine

## 2013-03-23 ENCOUNTER — Encounter: Payer: Self-pay | Admitting: Sports Medicine

## 2013-03-23 VITALS — BP 127/84 | HR 71 | Wt 176.0 lb

## 2013-03-23 DIAGNOSIS — M722 Plantar fascial fibromatosis: Secondary | ICD-10-CM

## 2013-03-23 NOTE — Assessment & Plan Note (Signed)
Improve after ultrasound guided injection and custom orthotics made by another facility. She does need to increase her compliance with the home exercises, and I think that this would be the most efficacious measure.

## 2013-03-23 NOTE — Progress Notes (Signed)
   Subjective:    CC: Followup  HPI: Left plantar fasciitis: Status post 2 ultrasound-guided injections, is very lackadaisical with the rehabilitation exercises, does have custom orthotics. Overall feeling better, but has not yet had a chance to try them out at work.  Past medical history, Surgical history, Family history not pertinant except as noted below, Social history, Allergies, and medications have been entered into the medical record, reviewed, and no changes needed.   Review of Systems: No headache, visual changes, nausea, vomiting, diarrhea, constipation, dizziness, abdominal pain, skin rash, fevers, chills, night sweats, weight loss, swollen lymph nodes, body aches, joint swelling, muscle aches, chest pain, shortness of breath, mood changes, visual or auditory hallucinations.   Objective:   General: Well Developed, well nourished, and in no acute distress.  Neuro/Psych: Alert and oriented x3, extra-ocular muscles intact, able to move all 4 extremities, sensation grossly intact. Skin: Warm and dry, no rashes noted.  Respiratory: Not using accessory muscles, speaking in full sentences, trachea midline.  Cardiovascular: Pulses palpable, no extremity edema. Abdomen: Does not appear distended. Left Foot: Foot inspection and palpation is unremarkable with the exception of tenderness to palpation over the mid plantar fascia. No abnormal callous present. Hammer toes absent. Impression and Recommendations:   This case required medical decision making of moderate complexity.

## 2013-04-11 ENCOUNTER — Other Ambulatory Visit: Payer: Self-pay | Admitting: Physician Assistant

## 2013-05-03 ENCOUNTER — Other Ambulatory Visit: Payer: Self-pay | Admitting: Family Medicine

## 2013-05-03 ENCOUNTER — Encounter: Payer: Self-pay | Admitting: Sports Medicine

## 2013-05-03 ENCOUNTER — Ambulatory Visit (INDEPENDENT_AMBULATORY_CARE_PROVIDER_SITE_OTHER): Payer: 59 | Admitting: Sports Medicine

## 2013-05-03 VITALS — BP 143/83 | HR 77 | Wt 178.0 lb

## 2013-05-03 DIAGNOSIS — M722 Plantar fascial fibromatosis: Secondary | ICD-10-CM

## 2013-05-03 MED ORDER — CYCLOBENZAPRINE HCL 10 MG PO TABS: 5.0000 mg | ORAL_TABLET | Freq: Every evening | ORAL | Status: DC | PRN

## 2013-05-03 MED ORDER — BUTALBITAL-ASA-CAFFEINE 50-325-40 MG PO CAPS: ORAL_CAPSULE | ORAL | Status: DC

## 2013-05-03 NOTE — Assessment & Plan Note (Signed)
Ultrasound guided injection/barbotage as above. Orthotics from another provider were ineffective. Strapped the foot with compressive bandage. Out of work today. Return in one month. He think we are moving closer to surgical intervention.

## 2013-05-03 NOTE — Progress Notes (Signed)
  Subjective:    CC: Followup plantar fasciitis  HPI: Left sided plantar fasciitis, improved slightly after injection, but no improvement orthotics from another provider. Pain continues to be localized at the calcaneal insertion of the plantar fascia, without radiation, moderate.  Past medical history, Surgical history, Family history not pertinant except as noted below, Social history, Allergies, and medications have been entered into the medical record, reviewed, and no changes needed.   Review of Systems: No fevers, chills, night sweats, weight loss, chest pain, or shortness of breath.   Objective:    General: Well Developed, well nourished, and in no acute distress.  Neuro: Alert and oriented x3, extra-ocular muscles intact, sensation grossly intact.  HEENT: Normocephalic, atraumatic, pupils equal round reactive to light, neck supple, no masses, no lymphadenopathy, thyroid nonpalpable.  Skin: Warm and dry, no rashes. Cardiac: Regular rate and rhythm, no murmurs rubs or gallops, no lower extremity edema.  Respiratory: Clear to auscultation bilaterally. Not using accessory muscles, speaking in full sentences.  Procedure: Real-time Ultrasound Guided barbotage of left plantar fascia. Device: GE Logiq E  Ultrasound guided injection is preferred based studies that show increased duration, increased effect, greater accuracy, decreased procedural pain, increased response rate, and decreased cost with ultrasound guided versus blind injection.  Verbal informed consent obtained.  Time-out conducted.  Noted no overlying erythema, induration, or other signs of local infection.  Skin prepped in a sterile fashion.  Local anesthesia: Topical Ethyl chloride.  With sterile technique and under real time ultrasound guidance:  25-gauge needle advanced to the calcaneal insertion of the plantar fascia, multiple passes were made with the needle just under the insertion while injecting 1 cc Kenalog 40, 4 cc  lidocaine. Completed without difficulty  Pain immediately resolved suggesting accurate placement of the medication.  Advised to call if fevers/chills, erythema, induration, drainage, or persistent bleeding.  Images permanently stored and available for review in the ultrasound unit.  Impression: Technically successful ultrasound guided injection.  Impression and Recommendations:

## 2013-05-05 ENCOUNTER — Other Ambulatory Visit: Payer: Self-pay | Admitting: Family Medicine

## 2013-06-01 ENCOUNTER — Other Ambulatory Visit: Payer: Self-pay | Admitting: Sports Medicine

## 2013-06-01 ENCOUNTER — Other Ambulatory Visit: Payer: Self-pay

## 2013-06-01 ENCOUNTER — Other Ambulatory Visit: Payer: Self-pay | Admitting: Family Medicine

## 2013-06-01 MED ORDER — BUTALBITAL-ASA-CAFFEINE 50-325-40 MG PO CAPS: ORAL_CAPSULE | ORAL | Status: DC

## 2013-06-05 ENCOUNTER — Ambulatory Visit: Payer: PRIVATE HEALTH INSURANCE | Admitting: Sports Medicine

## 2013-06-05 ENCOUNTER — Encounter: Payer: Self-pay | Admitting: Sports Medicine

## 2013-06-05 ENCOUNTER — Ambulatory Visit: Payer: PRIVATE HEALTH INSURANCE | Admitting: Family Medicine

## 2013-06-05 ENCOUNTER — Ambulatory Visit (INDEPENDENT_AMBULATORY_CARE_PROVIDER_SITE_OTHER): Payer: 59 | Admitting: Sports Medicine

## 2013-06-05 VITALS — BP 123/79 | HR 73 | Wt 175.0 lb

## 2013-06-05 DIAGNOSIS — M722 Plantar fascial fibromatosis: Secondary | ICD-10-CM

## 2013-06-05 NOTE — Progress Notes (Signed)
  Subjective:    CC: Followup  HPI: Left plantar fasciitis: Angela Oliver has now been through physical therapy, multiple injections under guidance, custom orthotics, NSAIDs, and overall her pain is persistent.  Past medical history, Surgical history, Family history not pertinant except as noted below, Social history, Allergies, and medications have been entered into the medical record, reviewed, and no changes needed.   Review of Systems: No fevers, chills, night sweats, weight loss, chest pain, or shortness of breath.   Objective:    General: Well Developed, well nourished, and in no acute distress.  Neuro: Alert and oriented x3, extra-ocular muscles intact, sensation grossly intact.  HEENT: Normocephalic, atraumatic, pupils equal round reactive to light, neck supple, no masses, no lymphadenopathy, thyroid nonpalpable.  Skin: Warm and dry, no rashes. Cardiac: Regular rate and rhythm, no murmurs rubs or gallops, no lower extremity edema.  Respiratory: Clear to auscultation bilaterally. Not using accessory muscles, speaking in full sentences. Left foot: Continues to be tender to palpation of the calcaneal origin of the plantar fascia.  Impression and Recommendations:

## 2013-06-05 NOTE — Assessment & Plan Note (Signed)
Angela Oliver has now had custom orthotics, NSAIDs, physical therapy, as well as multiple ultrasound-guided injections for left plantar fasciitis. Unfortunately symptoms are persistent, I think this point she becomes a surgical candidate. I'm going to refer her to [facility] for consideration of surgical intervention. She can followup with me on an as-needed basis.

## 2013-07-02 ENCOUNTER — Other Ambulatory Visit: Payer: Self-pay | Admitting: Sports Medicine

## 2013-07-02 ENCOUNTER — Other Ambulatory Visit: Payer: Self-pay | Admitting: Family Medicine

## 2013-08-06 ENCOUNTER — Ambulatory Visit (INDEPENDENT_AMBULATORY_CARE_PROVIDER_SITE_OTHER): Payer: 59 | Admitting: Family Medicine

## 2013-08-06 ENCOUNTER — Encounter: Payer: Self-pay | Admitting: Family Medicine

## 2013-08-06 VITALS — BP 116/76 | HR 71 | Ht 64.0 in | Wt 174.0 lb

## 2013-08-06 DIAGNOSIS — M76899 Other specified enthesopathies of unspecified lower limb, excluding foot: Secondary | ICD-10-CM

## 2013-08-06 DIAGNOSIS — M7071 Other bursitis of hip, right hip: Secondary | ICD-10-CM

## 2013-08-06 DIAGNOSIS — G43009 Migraine without aura, not intractable, without status migrainosus: Secondary | ICD-10-CM

## 2013-08-06 MED ORDER — SERTRALINE HCL 25 MG PO TABS: ORAL_TABLET | ORAL | Status: DC

## 2013-08-06 MED ORDER — BUTALBITAL-ASA-CAFFEINE 50-325-40 MG PO CAPS: ORAL_CAPSULE | ORAL | Status: DC

## 2013-08-06 MED ORDER — ZOLMITRIPTAN 2.5 MG PO TABS: 2.5000 mg | ORAL_TABLET | ORAL | Status: DC | PRN

## 2013-08-06 NOTE — Progress Notes (Signed)
Subjective:    Patient ID: Angela Oliver, female    DOB: 09-18-1966, 47 y.o.   MRN: [id number]  HPI Migraine HA.  - Getting about 2-3 HA per month, usually around her period.  Says the fiorinal works really well but using about 14 in 1-2 times a month.  Says will usually use Aleve.    Says the imitrex upsets her stomach so can only take it at bedtime.  She says she wakes up the next day with severe abdominal pain. She has not tried any of the other tryptans. Her current headache has lasted for about the last 3 days but she's actually feeling better today.  Right hip bursitis - had injection about a year ago and has done really well. Started flaring back up in the last 1-2 motnhs. Now painful to sleep on it. No ice or heat.  Wants another injection today. Says thinks her plantar fascia has been flared on the left foot. She plans on starting PT in next week or two.    Review of Systems     BP 116/76  Pulse 71  Ht 5\' 4"  (1.626 m)  Wt 174 lb (78.926 kg)  BMI 29.85 kg/m2    Allergies  Allergen Reactions  . Imitrex [Sumatriptan] Other (See Comments)    Severe nausea and abdominal pain  . Sulfonamide Derivatives     Past Medical History  Diagnosis Date  . Asthma   . Seasonal allergies   . Herpes simplex   . Migraines     Past Surgical History  Procedure Laterality Date  . Appendectomy    . Ovarian cyst removal      History   Social History  . Marital Status: Married    Spouse Name: N/A    Number of Children: N/A  . Years of Education: N/A   Occupational History  . Not on file.   Social History Main Topics  . Smoking status: Never Smoker   . Smokeless tobacco: Not on file  . Alcohol Use: No  . Drug Use: No  . Sexual Activity:    Other Topics Concern  . Not on file   Social History Narrative  . No narrative on file    Family History  Problem Relation Age of Onset  . Asthma Other   . Cancer Mother     breast cancer  . Alcohol abuse Father   . Diabetes Father    . Hyperlipidemia Father     Outpatient Encounter Prescriptions as of 08/06/2013  Medication Sig Dispense Refill  . acyclovir (ZOVIRAX) 400 MG tablet Take 400 mg by mouth 2 (two) times daily.      Marland Kitchen acyclovir ointment (ZOVIRAX) 5 % Apply topically every 3 (three) hours.  15 g  1  . albuterol (PROVENTIL HFA;VENTOLIN HFA) 108 (90 BASE) MCG/ACT inhaler Inhale 2 puffs into the lungs as needed.      Marland Kitchen ALTAVERA 0.15-30 MG-MCG tablet TAKE 1 TABLET BY MOUTH ONCE A DAY  28 tablet  12  . beclomethasone (QVAR) 80 MCG/ACT inhaler Inhale 2 puffs into the lungs 2 (two) times daily.       . butalbital-aspirin-caffeine (FIORINAL) 50-325-40 MG per capsule TAKE ONE CAPSULE TWICE A DAY AS NEEDED FOR HEADACHE  14 capsule  2  . cetirizine (ZYRTEC) 10 MG tablet Take 10 mg by mouth daily.      . cyclobenzaprine (FLEXERIL) 10 MG tablet Take 0.5-1 tablets (5-10 mg total) by mouth at bedtime as needed for muscle spasms.  30 tablet  0  . meloxicam (MOBIC) 15 MG tablet Take 1 tablet (15 mg total) by mouth daily. Take 1-2 tabs everyday for 2 weeks then as needed for joint pain.  90 tablet  3  . montelukast (SINGULAIR) 10 MG tablet TAKE 1 TABLET BY MOUTH DAILY.  90 tablet  1  . sertraline (ZOLOFT) 25 MG tablet TAKE 1 TABLET EVERY DAY  30 tablet  3  . valACYclovir (VALTREX) 1000 MG tablet 2 tabs twice a day for one day.  4 tablet  0  . [DISCONTINUED] butalbital-aspirin-caffeine (FIORINAL) 50-325-40 MG per capsule TAKE ONE CAPSULE TWICE A DAY AS NEEDED FOR HEADACHE  14 capsule  0  . [DISCONTINUED] sertraline (ZOLOFT) 25 MG tablet TAKE 1 TABLET EVERY DAY  30 tablet  3  . [DISCONTINUED] SUMAtriptan (IMITREX) 50 MG tablet Take 1 tablet (50 mg total) by mouth every 2 (two) hours as needed.  10 tablet  1  . ZOLMitriptan (ZOMIG) 2.5 MG tablet Take 1 tablet (2.5 mg total) by mouth as needed for migraine.  10 tablet  0   No facility-administered encounter medications on file as of 08/06/2013.       Objective:   Physical Exam   Constitutional: She is oriented to person, place, and time. She appears well-developed and well-nourished.  HENT:  Head: Normocephalic and atraumatic.  Musculoskeletal:  Very tender over the right greater trochanter. Normal range of motion of the hip.  Neurological: She is alert and oriented to person, place, and time.  Skin: Skin is warm and dry.  Psychiatric: She has a normal mood and affect. Her behavior is normal.          Assessment & Plan:  Migraine HA - Ok to refill the fiorinal. Will add imitrex to the intolerance list.  We discussed really monitoring her use of the fiorinal.  If using 14 tabs in one month then needs to consider prophylaxis.  We discussed with her multiple medications on the market we can consider for prophylaxis. We will also give her a prescription for Zomig instead of Imitrex to try and see she tolerates it well. She says she does not want to try any of the nasal spray options.  Right bursitis - Will inject today. Pt tolerated well.  Follow up wound care reviewed. Call if any problems or not improving.  Aspiration/Injection Procedure Note Kitti Mcclish [id number] 05-21-1966  Procedure: Injection Indications: Right trochanteric bursitis  Procedure Details Consent: Risks of procedure as well as the alternatives and risks of each were explained to the (patient/caregiver).  Consent for procedure obtained. Time Out: Verified patient identification, verified procedure, site/side was marked, verified correct patient position, special equipment/implants available, medications/allergies/relevent history reviewed, required imaging and test results available.  Performed   Local Anesthesia Used:Lidocaine 1% plain; 9mL Area cleaned with iodine soap and alcohol. 1ml of kenalog and 9 amounts of lidocaine 1% plain were injected into the bursa, fanning. Patient tolerated well. Band-Aid placed over wound site. A sterile dressing was applied.  Patient did tolerate procedure  well.  Estimated blood loss: 0  METHENEY,CATHERINE 08/06/2013, 4:27 PM

## 2013-08-15 ENCOUNTER — Ambulatory Visit: Payer: No Typology Code available for payment source | Attending: Orthopedic Surgery | Admitting: Physical Therapy

## 2013-08-15 DIAGNOSIS — M25579 Pain in unspecified ankle and joints of unspecified foot: Secondary | ICD-10-CM | POA: Insufficient documentation

## 2013-08-15 DIAGNOSIS — M25673 Stiffness of unspecified ankle, not elsewhere classified: Secondary | ICD-10-CM | POA: Insufficient documentation

## 2013-08-15 DIAGNOSIS — M6281 Muscle weakness (generalized): Secondary | ICD-10-CM | POA: Insufficient documentation

## 2013-08-15 DIAGNOSIS — M25676 Stiffness of unspecified foot, not elsewhere classified: Secondary | ICD-10-CM | POA: Insufficient documentation

## 2013-08-15 DIAGNOSIS — IMO0001 Reserved for inherently not codable concepts without codable children: Secondary | ICD-10-CM | POA: Insufficient documentation

## 2013-08-20 ENCOUNTER — Telehealth: Payer: Self-pay | Admitting: *Deleted

## 2013-08-20 NOTE — Telephone Encounter (Signed)
Prior auth obtained for Ambien through [employer].  Notified pharmacy. Barry Dienes, LPN

## 2013-08-21 ENCOUNTER — Ambulatory Visit: Payer: No Typology Code available for payment source | Admitting: Physical Therapy

## 2013-08-21 ENCOUNTER — Ambulatory Visit: Payer: PRIVATE HEALTH INSURANCE | Admitting: Family Medicine

## 2013-08-21 ENCOUNTER — Encounter: Payer: Self-pay | Admitting: Family Medicine

## 2013-08-21 ENCOUNTER — Ambulatory Visit (INDEPENDENT_AMBULATORY_CARE_PROVIDER_SITE_OTHER): Payer: PRIVATE HEALTH INSURANCE | Admitting: Family Medicine

## 2013-08-21 VITALS — BP 129/84 | HR 80 | Temp 98.3°F | Wt 174.0 lb

## 2013-08-21 DIAGNOSIS — J45909 Unspecified asthma, uncomplicated: Secondary | ICD-10-CM

## 2013-08-21 DIAGNOSIS — J019 Acute sinusitis, unspecified: Secondary | ICD-10-CM

## 2013-08-21 DIAGNOSIS — J029 Acute pharyngitis, unspecified: Secondary | ICD-10-CM

## 2013-08-21 NOTE — Progress Notes (Signed)
  Subjective:    Patient ID: Angela Oliver, female    DOB: September 21, 1966, 47 y.o.   MRN: [id number]  HPI ST and sinus congestion x 3 days.  + ears plugged.  + sinuses are congested.  Hx of asthma.  Not sure if has had a fever.  Felt really nauseted last night. Had 3 episodes of diarrhea this Am. Feels achey overall. Fever blisters are breaking out.  She takes Zyrtec daily. Not taking singulair currently.  Using her QVAR  QD. Hasn't had to use her rescue inhaler yet.    Review of Systems     Objective:   Physical Exam  Constitutional: She is oriented to person, place, and time. She appears well-developed and well-nourished.  HENT:  Head: Normocephalic and atraumatic.  Right Ear: External ear normal.  Left Ear: External ear normal.  Nose: Nose normal.  Mouth/Throat: Oropharynx is clear and moist.  TMs and canals are clear.   Eyes: Conjunctivae and EOM are normal. Pupils are equal, round, and reactive to light.  Neck: Neck supple. No thyromegaly present.  Cardiovascular: Normal rate, regular rhythm and normal heart sounds.   Pulmonary/Chest: Effort normal and breath sounds normal. She has no wheezes.  Lymphadenopathy:    She has no cervical adenopathy.  Neurological: She is alert and oriented to person, place, and time.  Skin: Skin is warm and dry.  Psychiatric: She has a normal mood and affect.          Assessment & Plan:  Acute sinusitis - with her history of recurrent sinus infections that become severe I will go ahead and treat her prematurely. We did have a long discussion about the fact that most of the time it's viral in the she's had symptoms for 5-7 days it is unlikely to be bacterial and that is why she may not have responded well to antibiotics in the past. Encourage her to give this a couple more days before she starts the antibiotic. Ensure hydrating well. We also discussed maybe using a nasal steroid spray to prevent recurrent sinus infections but she says she cannot tolerate  spring anything in her nose. Her previous physician tried to get her to use a nasal steroid spray as well.    asthma-stable. She's not had these rescue inhaler and her lungs sound great today. She does have one in her purse with her today and encouraged her to use it liberally while she is sick.

## 2013-08-24 ENCOUNTER — Encounter: Payer: 59 | Admitting: Physical Therapy

## 2013-08-28 ENCOUNTER — Ambulatory Visit: Payer: No Typology Code available for payment source | Admitting: Physical Therapy

## 2013-08-31 ENCOUNTER — Ambulatory Visit: Payer: No Typology Code available for payment source | Admitting: Physical Therapy

## 2013-09-01 ENCOUNTER — Emergency Department (INDEPENDENT_AMBULATORY_CARE_PROVIDER_SITE_OTHER)
Admission: EM | Admit: 2013-09-01 | Discharge: 2013-09-01 | Disposition: A | Discharge status: Home / Self Care | Payer: PRIVATE HEALTH INSURANCE | Source: Home / Self Care | Attending: Family Medicine | Admitting: Family Medicine

## 2013-09-01 DIAGNOSIS — G43909 Migraine, unspecified, not intractable, without status migrainosus: Secondary | ICD-10-CM

## 2013-09-01 MED ORDER — DEXAMETHASONE SODIUM PHOSPHATE 10 MG/ML IJ SOLN
10.0000 mg | Freq: Once | INTRAMUSCULAR | Status: AC
  Administered 2013-09-01: 10 mg via INTRAMUSCULAR

## 2013-09-01 MED ORDER — PROMETHAZINE HCL 25 MG PO TABS: 25.0000 mg | ORAL_TABLET | Freq: Four times a day (QID) | ORAL | Status: DC | PRN

## 2013-09-01 MED ORDER — PROMETHAZINE HCL 25 MG/ML IJ SOLN
25.0000 mg | Freq: Once | INTRAMUSCULAR | Status: AC
  Administered 2013-09-01: 25 mg via INTRAMUSCULAR

## 2013-09-01 NOTE — ED Provider Notes (Signed)
CSN: [id number]     Arrival date & time 09/01/13  1044 History   First MD Initiated Contact with Patient 09/01/13 1049     Chief Complaint  Patient presents with  . Migraine    HPI  Pt presents today with chief complaint of headache/migraine Pt states that she has longstanding hx/o migraines Migraines usually triggered by menstrual cycle and rain Was instructed by GYN to skip blanks on OCPs Skipped blanks but still had menstrual cycle. Was also raining that day.  Has had HA over last 4-5 days.  HA mainly frontal with some occipital radiation. This is typical for HA.  + photophobia and nausea.  Has taken butalbital, imitrex, aleve with minimal relief in sxs.    Past Medical History  Diagnosis Date  . Asthma   . Seasonal allergies   . Herpes simplex   . Migraines    Past Surgical History  Procedure Laterality Date  . Appendectomy    . Ovarian cyst removal     Family History  Problem Relation Age of Onset  . Asthma Other   . Cancer Mother     breast cancer  . Alcohol abuse Father   . Diabetes Father   . Hyperlipidemia Father    History  Substance Use Topics  . Smoking status: Never Smoker   . Smokeless tobacco: Not on file  . Alcohol Use: No   OB History   Grav Para Term Preterm Abortions TAB SAB Ect Mult Living                 Review of Systems  All other systems reviewed and are negative.    Allergies  Imitrex and Sulfonamide derivatives  Home Medications   Current Outpatient Rx  Name  Route  Sig  Dispense  Refill  . acyclovir (ZOVIRAX) 400 MG tablet   Oral   Take 400 mg by mouth 2 (two) times daily.         Marland Kitchen acyclovir ointment (ZOVIRAX) 5 %   Topical   Apply topically every 3 (three) hours.   15 g   1   . albuterol (PROVENTIL HFA;VENTOLIN HFA) 108 (90 BASE) MCG/ACT inhaler   Inhalation   Inhale 2 puffs into the lungs as needed.         Marland Kitchen ALTAVERA 0.15-30 MG-MCG tablet      TAKE 1 TABLET BY MOUTH ONCE A DAY   28 tablet   12   .  beclomethasone (QVAR) 80 MCG/ACT inhaler   Inhalation   Inhale 2 puffs into the lungs 2 (two) times daily.          . butalbital-aspirin-caffeine (FIORINAL) 50-325-40 MG per capsule      TAKE ONE CAPSULE TWICE A DAY AS NEEDED FOR HEADACHE   14 capsule   2   . cetirizine (ZYRTEC) 10 MG tablet   Oral   Take 10 mg by mouth daily.         . cyclobenzaprine (FLEXERIL) 10 MG tablet   Oral   Take 0.5-1 tablets (5-10 mg total) by mouth at bedtime as needed for muscle spasms.   30 tablet   0   . meloxicam (MOBIC) 15 MG tablet   Oral   Take 1 tablet (15 mg total) by mouth daily. Take 1-2 tabs everyday for 2 weeks then as needed for joint pain.   90 tablet   3   . montelukast (SINGULAIR) 10 MG tablet      TAKE 1 TABLET  BY MOUTH DAILY.   90 tablet   1     3   . sertraline (ZOLOFT) 25 MG tablet      TAKE 1 TABLET EVERY DAY   30 tablet   3   . valACYclovir (VALTREX) 1000 MG tablet      2 tabs twice a day for one day.   4 tablet   0   . ZOLMitriptan (ZOMIG) 2.5 MG tablet   Oral   Take 1 tablet (2.5 mg total) by mouth as needed for migraine.   10 tablet   0    BP 132/86  Pulse 68  Temp(Src) 97.6 F (36.4 C) (Oral)  Ht 5\' 4"  (1.626 m)  Wt 171 lb (77.565 kg)  BMI 29.34 kg/m2  SpO2 98%  LMP 08/29/2013 Physical Exam  Constitutional:  In minimal to mild distress 2/2 migraine   HENT:  Head: Normocephalic and atraumatic.  Eyes: Conjunctivae are normal. Pupils are equal, round, and reactive to light.  Neck: Normal range of motion. Neck supple.  Cardiovascular: Normal rate and regular rhythm.   Pulmonary/Chest: Effort normal and breath sounds normal.  Abdominal: Soft. Bowel sounds are normal.  Musculoskeletal: Normal range of motion.  Neurological: She is alert.    ED Course  Procedures (including critical care time) Labs Review Labs Reviewed - No data to display Imaging Review No results found.  MDM   1. Migraine    Decadron 10mg  IM x1 and phenergan  25mg  IMx1 for abortive treatment Will Rx phenergan Discussed ppx treatment as well as change birth control options (depo-provera, IUD).  Discussed neuro and general red flags.  Follow up as needed.      The patient and/or caregiver has been counseled thoroughly with regard to treatment plan and/or medications prescribed including dosage, schedule, interactions, rationale for use, and possible side effects and they verbalize understanding. Diagnoses and expected course of recovery discussed and will return if not improved as expected or if the condition worsens. Patient and/or caregiver verbalized understanding.         Doree Albee, MD 09/01/13 1118

## 2013-09-01 NOTE — ED Notes (Signed)
States she has had a migraine x4 days.  Was seen by PCP x 2 weeks ago for sinus infection/asthma. Now taking Forva, Buta-ASA-CAFF and sumatriptan without relief.

## 2013-09-04 ENCOUNTER — Ambulatory Visit: Payer: No Typology Code available for payment source | Admitting: Physical Therapy

## 2013-09-06 ENCOUNTER — Ambulatory Visit: Payer: No Typology Code available for payment source | Attending: Orthopedic Surgery | Admitting: Physical Therapy

## 2013-09-06 DIAGNOSIS — M6281 Muscle weakness (generalized): Secondary | ICD-10-CM | POA: Insufficient documentation

## 2013-09-06 DIAGNOSIS — M25676 Stiffness of unspecified foot, not elsewhere classified: Secondary | ICD-10-CM | POA: Insufficient documentation

## 2013-09-06 DIAGNOSIS — IMO0001 Reserved for inherently not codable concepts without codable children: Secondary | ICD-10-CM | POA: Insufficient documentation

## 2013-09-06 DIAGNOSIS — M25579 Pain in unspecified ankle and joints of unspecified foot: Secondary | ICD-10-CM | POA: Insufficient documentation

## 2013-09-06 DIAGNOSIS — M25673 Stiffness of unspecified ankle, not elsewhere classified: Secondary | ICD-10-CM | POA: Insufficient documentation

## 2013-09-11 ENCOUNTER — Ambulatory Visit: Payer: No Typology Code available for payment source | Admitting: Physical Therapy

## 2013-09-14 ENCOUNTER — Ambulatory Visit: Payer: PRIVATE HEALTH INSURANCE | Attending: Orthopedic Surgery | Admitting: Physical Therapy

## 2013-09-14 DIAGNOSIS — M25579 Pain in unspecified ankle and joints of unspecified foot: Secondary | ICD-10-CM | POA: Insufficient documentation

## 2013-09-14 DIAGNOSIS — M25676 Stiffness of unspecified foot, not elsewhere classified: Secondary | ICD-10-CM | POA: Insufficient documentation

## 2013-09-14 DIAGNOSIS — IMO0001 Reserved for inherently not codable concepts without codable children: Secondary | ICD-10-CM | POA: Insufficient documentation

## 2013-09-14 DIAGNOSIS — M25673 Stiffness of unspecified ankle, not elsewhere classified: Secondary | ICD-10-CM | POA: Insufficient documentation

## 2013-09-14 DIAGNOSIS — M6281 Muscle weakness (generalized): Secondary | ICD-10-CM | POA: Insufficient documentation

## 2013-09-18 ENCOUNTER — Telehealth: Payer: Self-pay | Admitting: *Deleted

## 2013-09-18 ENCOUNTER — Ambulatory Visit: Payer: PRIVATE HEALTH INSURANCE | Admitting: Physical Therapy

## 2013-09-18 NOTE — Telephone Encounter (Signed)
Pt would like to have FMLA paperwork filled out with regards to her migraines. She has been seen by dr.metheney about this in the past and was recently seen at [facility] for this. She will bring the paperwork in to be filled out.Angela Oliver

## 2013-09-21 ENCOUNTER — Ambulatory Visit: Payer: PRIVATE HEALTH INSURANCE | Admitting: Physical Therapy

## 2013-09-25 ENCOUNTER — Ambulatory Visit: Payer: PRIVATE HEALTH INSURANCE | Admitting: Physical Therapy

## 2013-09-28 ENCOUNTER — Ambulatory Visit: Payer: PRIVATE HEALTH INSURANCE | Admitting: Physical Therapy

## 2013-10-02 ENCOUNTER — Ambulatory Visit: Payer: PRIVATE HEALTH INSURANCE | Admitting: Physical Therapy

## 2013-10-05 ENCOUNTER — Ambulatory Visit: Payer: PRIVATE HEALTH INSURANCE | Admitting: Physical Therapy

## 2013-10-08 ENCOUNTER — Ambulatory Visit: Payer: PRIVATE HEALTH INSURANCE | Admitting: Physical Therapy

## 2013-10-10 ENCOUNTER — Encounter: Payer: PRIVATE HEALTH INSURANCE | Admitting: Physical Therapy

## 2013-10-18 ENCOUNTER — Other Ambulatory Visit: Payer: Self-pay

## 2013-11-22 ENCOUNTER — Encounter: Payer: Self-pay | Admitting: Sports Medicine

## 2013-11-22 ENCOUNTER — Ambulatory Visit (INDEPENDENT_AMBULATORY_CARE_PROVIDER_SITE_OTHER): Payer: No Typology Code available for payment source

## 2013-11-22 ENCOUNTER — Ambulatory Visit (INDEPENDENT_AMBULATORY_CARE_PROVIDER_SITE_OTHER): Payer: No Typology Code available for payment source | Admitting: Sports Medicine

## 2013-11-22 VITALS — BP 134/84 | HR 76 | Wt 179.0 lb

## 2013-11-22 DIAGNOSIS — M7730 Calcaneal spur, unspecified foot: Secondary | ICD-10-CM

## 2013-11-22 DIAGNOSIS — M7061 Trochanteric bursitis, right hip: Secondary | ICD-10-CM | POA: Insufficient documentation

## 2013-11-22 DIAGNOSIS — M79672 Pain in left foot: Secondary | ICD-10-CM | POA: Insufficient documentation

## 2013-11-22 DIAGNOSIS — M79609 Pain in unspecified limb: Secondary | ICD-10-CM

## 2013-11-22 DIAGNOSIS — M722 Plantar fascial fibromatosis: Secondary | ICD-10-CM

## 2013-11-22 DIAGNOSIS — M76899 Other specified enthesopathies of unspecified lower limb, excluding foot: Secondary | ICD-10-CM

## 2013-11-22 NOTE — Patient Instructions (Signed)
Hip Rehabilitation Protocol:  1.  Side leg raises.  3x30 with no weight, then 3x15 with 2 lb ankle weight, then 3x15 with 5 lb ankle weight 2.  Standing hip rotation.  3x30 with no weight, then 3x15 with 2 lb ankle weight, then 3x15 with 5 lb ankle weight. 3.  Side step ups.  3x30 with no weight, then 3x15 with 5 lbs in backpack, then 3x15 with 10 lbs in backpack. 

## 2013-11-22 NOTE — Assessment & Plan Note (Signed)
With persistent hip abductor weakness on the right side. Injection as above, hip abductor rehabilitation exercises given. Return to see me 6 weeks, to retest strength.

## 2013-11-22 NOTE — Assessment & Plan Note (Signed)
Still with some pain, she has seen the surgeon. Her pain now is in a different location, over the dorsum of the fifth metatarsal at the base. Plan going to get an x-ray, this is concerning for a stress injury.

## 2013-11-22 NOTE — Progress Notes (Signed)
  Subjective:    CC: Right hip pain  HPI: This is a very pleasant 47 year old female who been seen for plantar fascial fibromatosis, she has been to the surgeon who placed her to physical therapy which is improving her symptoms slightly, she does still then she will proceed to surgery as she is having significant pain. Unfortunately for the past several weeks she has also developed pain that she localizes over her right greater trochanter. She had an injection in the past, which worked well. She does desire interventional treatment today. Pain is localized, doesn't radiate, severe, persistent.  Past medical history, Surgical history, Family history not pertinant except as noted below, Social history, Allergies, and medications have been entered into the medical record, reviewed, and no changes needed.   Review of Systems: No fevers, chills, night sweats, weight loss, chest pain, or shortness of breath.   Objective:    General: Well Developed, well nourished, and in no acute distress.  Neuro: Alert and oriented x3, extra-ocular muscles intact, sensation grossly intact.  HEENT: Normocephalic, atraumatic, pupils equal round reactive to light, neck supple, no masses, no lymphadenopathy, thyroid nonpalpable.  Skin: Warm and dry, no rashes. Cardiac: Regular rate and rhythm, no murmurs rubs or gallops, no lower extremity edema.  Respiratory: Clear to auscultation bilaterally. Not using accessory muscles, speaking in full sentences. Right Hip: Hip abductor's on the right side are weak, she is also very tender to palpation over the greater trochanter. Pelvic alignment unremarkable to inspection and palpation. Standing hip rotation and gait without trendelenburg sign / unsteadiness. No pain with FABER or FADIR. No SI joint tenderness and normal minimal SI movement.  Procedure:  Injection of right greater trochanteric bursa Consent obtained and verified. Time-out conducted. Noted no overlying  erythema, induration, or other signs of local infection. Skin prepped in a sterile fashion. Topical analgesic spray: Ethyl chloride. Completed without difficulty. Meds: Via a spinal needle, 1 cc Kenalog 40, 4 cc lidocaine injected easily. Pain immediately improved suggesting accurate placement of the medication. Advised to call if fevers/chills, erythema, induration, drainage, or persistent bleeding.  Impression and Recommendations:

## 2013-11-25 ENCOUNTER — Other Ambulatory Visit: Payer: Self-pay | Admitting: Sports Medicine

## 2013-12-14 DIAGNOSIS — Z0289 Encounter for other administrative examinations: Secondary | ICD-10-CM

## 2013-12-17 ENCOUNTER — Other Ambulatory Visit: Payer: Self-pay | Admitting: Family Medicine

## 2013-12-17 ENCOUNTER — Other Ambulatory Visit: Payer: Self-pay | Admitting: *Deleted

## 2013-12-17 MED ORDER — ACYCLOVIR 400 MG PO TABS: 400.0000 mg | ORAL_TABLET | Freq: Two times a day (BID) | ORAL | Status: DC

## 2014-01-03 ENCOUNTER — Ambulatory Visit: Payer: PRIVATE HEALTH INSURANCE | Admitting: Sports Medicine

## 2014-01-27 ENCOUNTER — Other Ambulatory Visit: Payer: Self-pay | Admitting: Family Medicine

## 2014-02-04 ENCOUNTER — Other Ambulatory Visit: Payer: Self-pay | Admitting: Family Medicine

## 2014-02-15 ENCOUNTER — Other Ambulatory Visit: Payer: Self-pay | Admitting: Physician Assistant

## 2014-02-19 ENCOUNTER — Encounter: Payer: Self-pay | Admitting: Sports Medicine

## 2014-02-19 ENCOUNTER — Ambulatory Visit (INDEPENDENT_AMBULATORY_CARE_PROVIDER_SITE_OTHER): Payer: PRIVATE HEALTH INSURANCE

## 2014-02-19 ENCOUNTER — Ambulatory Visit (INDEPENDENT_AMBULATORY_CARE_PROVIDER_SITE_OTHER): Payer: No Typology Code available for payment source | Admitting: Sports Medicine

## 2014-02-19 VITALS — BP 130/72 | HR 75 | Ht 64.0 in | Wt 180.0 lb

## 2014-02-19 DIAGNOSIS — M79609 Pain in unspecified limb: Secondary | ICD-10-CM

## 2014-02-19 DIAGNOSIS — M79672 Pain in left foot: Secondary | ICD-10-CM | POA: Insufficient documentation

## 2014-02-19 DIAGNOSIS — M545 Low back pain, unspecified: Secondary | ICD-10-CM

## 2014-02-19 DIAGNOSIS — M47816 Spondylosis without myelopathy or radiculopathy, lumbar region: Secondary | ICD-10-CM | POA: Insufficient documentation

## 2014-02-19 MED ORDER — PREDNISONE 50 MG PO TABS: ORAL_TABLET | ORAL | Status: DC

## 2014-02-19 NOTE — Assessment & Plan Note (Signed)
Prednisone, Mobic, formal PT, x-rays. Return in one month, MRI if no better.

## 2014-02-19 NOTE — Assessment & Plan Note (Signed)
Pain is at the left fourth metatarsocuboid joint. Injection placed into the joint under guidance. Return as needed for this.

## 2014-02-19 NOTE — Progress Notes (Signed)
  Subjective:    CC: Low back pain  HPI: This is a pleasant 48 year old female, who comes in with a several-day history of acute low back pain on the right side, axial without radicular symptoms. Moderate, persistent, no trauma, no recent overuse however she does lift once during the day at work. No bowel or bladder dysfunction that is new, no saddle anesthesia. Moderate, persistent.  Left foot pain: Localized of the metatarsal cuboid joint, moderate, persistent without radiation.  Past medical history, Surgical history, Family history not pertinant except as noted below, Social history, Allergies, and medications have been entered into the medical record, reviewed, and no changes needed.   Review of Systems: No fevers, chills, night sweats, weight loss, chest pain, or shortness of breath.   Objective:    General: Well Developed, well nourished, and in no acute distress.  Neuro: Alert and oriented x3, extra-ocular muscles intact, sensation grossly intact.  HEENT: Normocephalic, atraumatic, pupils equal round reactive to light, neck supple, no masses, no lymphadenopathy, thyroid nonpalpable.  Skin: Warm and dry, no rashes. Cardiac: Regular rate and rhythm, no murmurs rubs or gallops, no lower extremity edema.  Respiratory: Clear to auscultation bilaterally. Not using accessory muscles, speaking in full sentences. Back Exam:  Inspection: Unremarkable  Motion: Flexion 45 deg, Extension 45 deg, Side Bending to 45 deg bilaterally,  Rotation to 45 deg bilaterally  SLR laying: Negative  XSLR laying: Negative  Palpable tenderness: None. FABER: negative. Sensory change: Gross sensation intact to all lumbar and sacral dermatomes.  Reflexes: 2+ at both patellar tendons, 2+ at achilles tendons, Babinski's downgoing.  Strength at foot  Plantar-flexion: 5/5 Dorsi-flexion: 5/5 Eversion: 5/5 Inversion: 5/5  Leg strength  Quad: 5/5 Hamstring: 5/5 Hip flexor: 5/5 Hip abductors: 5/5  Gait  unremarkable. Left Foot: No visible erythema or swelling. Range of motion is full in all directions. Strength is 5/5 in all directions. No hallux valgus. No pes cavus or pes planus. No abnormal callus noted. No pain over the navicular prominence, or base of fifth metatarsal. No tenderness to palpation of the calcaneal insertion of plantar fascia. No pain at the Achilles insertion. No pain over the calcaneal bursa. No pain of the retrocalcaneal bursa. Discrete tenderness to palpation of the fourth metatarsal cuboid joint.geal joints. No pain with compression of the metatarsal heads. Neurovascularly intact distally.  Procedure: Real-time Ultrasound Guided Injection of left fourth metatarsal cuboid joint Device: GE Logiq E  Verbal informed consent obtained.  Time-out conducted.  Noted no overlying erythema, induration, or other signs of local infection.  Skin prepped in a sterile fashion.  Local anesthesia: Topical Ethyl chloride.  With sterile technique and under real time ultrasound guidance:  25-gauge needle advanced into the metatarsal cuboid joint, 0.5 cc Kenalog 41 cc lidocaine injected easily. Completed without difficulty  Pain immediately resolved suggesting accurate placement of the medication.  Advised to call if fevers/chills, erythema, induration, drainage, or persistent bleeding.  Images permanently stored and available for review in the ultrasound unit.  Impression: Technically successful ultrasound guided injection.  Impression and Recommendations:

## 2014-02-25 ENCOUNTER — Ambulatory Visit (INDEPENDENT_AMBULATORY_CARE_PROVIDER_SITE_OTHER): Payer: PRIVATE HEALTH INSURANCE | Admitting: Physical Therapy

## 2014-02-25 DIAGNOSIS — M545 Low back pain, unspecified: Secondary | ICD-10-CM

## 2014-02-27 ENCOUNTER — Encounter (INDEPENDENT_AMBULATORY_CARE_PROVIDER_SITE_OTHER): Payer: PRIVATE HEALTH INSURANCE | Admitting: Physical Therapy

## 2014-02-27 DIAGNOSIS — M545 Low back pain, unspecified: Secondary | ICD-10-CM

## 2014-02-27 DIAGNOSIS — M2560 Stiffness of unspecified joint, not elsewhere classified: Secondary | ICD-10-CM

## 2014-03-04 ENCOUNTER — Encounter (INDEPENDENT_AMBULATORY_CARE_PROVIDER_SITE_OTHER): Payer: PRIVATE HEALTH INSURANCE

## 2014-03-04 DIAGNOSIS — M545 Low back pain, unspecified: Secondary | ICD-10-CM

## 2014-03-04 DIAGNOSIS — M2560 Stiffness of unspecified joint, not elsewhere classified: Secondary | ICD-10-CM

## 2014-03-06 ENCOUNTER — Encounter: Payer: No Typology Code available for payment source | Admitting: Physical Therapy

## 2014-03-07 ENCOUNTER — Encounter (INDEPENDENT_AMBULATORY_CARE_PROVIDER_SITE_OTHER): Payer: PRIVATE HEALTH INSURANCE | Admitting: Physical Therapy

## 2014-03-07 DIAGNOSIS — M545 Low back pain, unspecified: Secondary | ICD-10-CM

## 2014-03-07 DIAGNOSIS — M2560 Stiffness of unspecified joint, not elsewhere classified: Secondary | ICD-10-CM

## 2014-03-11 ENCOUNTER — Encounter (INDEPENDENT_AMBULATORY_CARE_PROVIDER_SITE_OTHER): Payer: PRIVATE HEALTH INSURANCE | Admitting: Physical Therapy

## 2014-03-11 DIAGNOSIS — M2560 Stiffness of unspecified joint, not elsewhere classified: Secondary | ICD-10-CM

## 2014-03-11 DIAGNOSIS — M545 Low back pain, unspecified: Secondary | ICD-10-CM

## 2014-03-13 ENCOUNTER — Encounter: Payer: No Typology Code available for payment source | Admitting: Physical Therapy

## 2014-03-13 ENCOUNTER — Encounter (INDEPENDENT_AMBULATORY_CARE_PROVIDER_SITE_OTHER): Payer: PRIVATE HEALTH INSURANCE

## 2014-03-13 DIAGNOSIS — M2560 Stiffness of unspecified joint, not elsewhere classified: Secondary | ICD-10-CM

## 2014-03-13 DIAGNOSIS — M545 Low back pain, unspecified: Secondary | ICD-10-CM

## 2014-03-18 ENCOUNTER — Encounter (INDEPENDENT_AMBULATORY_CARE_PROVIDER_SITE_OTHER): Payer: PRIVATE HEALTH INSURANCE | Admitting: Physical Therapy

## 2014-03-18 DIAGNOSIS — M2560 Stiffness of unspecified joint, not elsewhere classified: Secondary | ICD-10-CM

## 2014-03-18 DIAGNOSIS — M545 Low back pain, unspecified: Secondary | ICD-10-CM

## 2014-03-19 ENCOUNTER — Ambulatory Visit (INDEPENDENT_AMBULATORY_CARE_PROVIDER_SITE_OTHER): Payer: No Typology Code available for payment source | Admitting: Sports Medicine

## 2014-03-19 ENCOUNTER — Encounter: Payer: Self-pay | Admitting: Sports Medicine

## 2014-03-19 VITALS — BP 127/86 | HR 82 | Ht 64.0 in | Wt 178.0 lb

## 2014-03-19 DIAGNOSIS — M79609 Pain in unspecified limb: Secondary | ICD-10-CM

## 2014-03-19 DIAGNOSIS — M79672 Pain in left foot: Secondary | ICD-10-CM

## 2014-03-19 DIAGNOSIS — M545 Low back pain, unspecified: Secondary | ICD-10-CM

## 2014-03-19 MED ORDER — CYCLOBENZAPRINE HCL 10 MG PO TABS: 10.0000 mg | ORAL_TABLET | Freq: Three times a day (TID) | ORAL | Status: DC | PRN

## 2014-03-19 MED ORDER — NAPROXEN 500 MG PO TABS: 500.0000 mg | ORAL_TABLET | Freq: Two times a day (BID) | ORAL | Status: DC

## 2014-03-19 NOTE — Progress Notes (Signed)
  Subjective:    CC: Followup  HPI: Left foot pain: Resolved after fourth metatarsal cuboid injection at the last visit.  Low back pain: Improved significantly with Flexeril, meloxicam, physical therapy, she localizes the pain to the right SI joint, no radiation, not worse with Valsalva, not worse with sitting or driving a car, moderate, persistent.  Past medical history, Surgical history, Family history not pertinant except as noted below, Social history, Allergies, and medications have been entered into the medical record, reviewed, and no changes needed.   Review of Systems: No fevers, chills, night sweats, weight loss, chest pain, or shortness of breath.   Objective:    General: Well Developed, well nourished, and in no acute distress.  Neuro: Alert and oriented x3, extra-ocular muscles intact, sensation grossly intact.  HEENT: Normocephalic, atraumatic, pupils equal round reactive to light, neck supple, no masses, no lymphadenopathy, thyroid nonpalpable.  Skin: Warm and dry, no rashes. Cardiac: Regular rate and rhythm, no murmurs rubs or gallops, no lower extremity edema.  Respiratory: Clear to auscultation bilaterally. Not using accessory muscles, speaking in full sentences. Back Exam:  Inspection: Unremarkable  Motion: Flexion 45 deg, Extension 45 deg, Side Bending to 45 deg bilaterally,  Rotation to 45 deg bilaterally  SLR laying: Negative  XSLR laying: Negative  Palpable tenderness: Just medial to the right posterior-superior iliac spine over the SI joint. FABER: negative. Sensory change: Gross sensation intact to all lumbar and sacral dermatomes.  Reflexes: 2+ at both patellar tendons, 2+ at achilles tendons, Babinski's downgoing.  Strength at foot  Plantar-flexion: 5/5 Dorsi-flexion: 5/5 Eversion: 5/5 Inversion: 5/5  Leg strength  Quad: 5/5 Hamstring: 5/5 Hip flexor: 5/5 Hip abductors: 5/5  Gait unremarkable.  Impression and Recommendations:

## 2014-03-19 NOTE — Assessment & Plan Note (Signed)
Pain seems to be predominantly referable to the right SI joint. Switching to naproxen twice a day, refilling Flexeril. Return in 2 weeks, ultrasound-guided right-sided SI joint injection if no better.

## 2014-03-19 NOTE — Assessment & Plan Note (Signed)
Resolved after fourth metatarsal cuboid joint injection at the last visit.

## 2014-03-22 ENCOUNTER — Encounter (INDEPENDENT_AMBULATORY_CARE_PROVIDER_SITE_OTHER): Payer: PRIVATE HEALTH INSURANCE | Admitting: Physical Therapy

## 2014-03-22 DIAGNOSIS — M545 Low back pain, unspecified: Secondary | ICD-10-CM

## 2014-03-22 DIAGNOSIS — M2560 Stiffness of unspecified joint, not elsewhere classified: Secondary | ICD-10-CM

## 2014-03-26 ENCOUNTER — Encounter (INDEPENDENT_AMBULATORY_CARE_PROVIDER_SITE_OTHER): Payer: PRIVATE HEALTH INSURANCE | Admitting: Physical Therapy

## 2014-03-26 DIAGNOSIS — M545 Low back pain, unspecified: Secondary | ICD-10-CM

## 2014-03-26 DIAGNOSIS — M2560 Stiffness of unspecified joint, not elsewhere classified: Secondary | ICD-10-CM

## 2014-03-28 ENCOUNTER — Encounter (INDEPENDENT_AMBULATORY_CARE_PROVIDER_SITE_OTHER): Payer: PRIVATE HEALTH INSURANCE

## 2014-03-28 DIAGNOSIS — M545 Low back pain, unspecified: Secondary | ICD-10-CM

## 2014-03-28 DIAGNOSIS — M2560 Stiffness of unspecified joint, not elsewhere classified: Secondary | ICD-10-CM

## 2014-03-29 ENCOUNTER — Encounter: Payer: Self-pay | Admitting: Physician Assistant

## 2014-03-29 ENCOUNTER — Ambulatory Visit (INDEPENDENT_AMBULATORY_CARE_PROVIDER_SITE_OTHER): Payer: PRIVATE HEALTH INSURANCE | Admitting: Physician Assistant

## 2014-03-29 VITALS — BP 130/90 | HR 94 | Temp 98.1°F | Ht 64.0 in | Wt 173.0 lb

## 2014-03-29 DIAGNOSIS — G43909 Migraine, unspecified, not intractable, without status migrainosus: Secondary | ICD-10-CM

## 2014-03-29 DIAGNOSIS — J019 Acute sinusitis, unspecified: Secondary | ICD-10-CM

## 2014-03-29 DIAGNOSIS — J45909 Unspecified asthma, uncomplicated: Secondary | ICD-10-CM

## 2014-03-29 MED ORDER — AZITHROMYCIN 250 MG PO TABS
ORAL_TABLET | ORAL | Status: DC
Start: 2014-03-29 — End: 2014-04-09

## 2014-03-29 MED ORDER — METHYLPREDNISOLONE SODIUM SUCC 125 MG IJ SOLR
125.0000 mg | Freq: Once | INTRAMUSCULAR | Status: AC
  Administered 2014-03-29: 125 mg via INTRAMUSCULAR

## 2014-03-29 MED ORDER — KETOROLAC TROMETHAMINE 60 MG/2ML IM SOLN
60.0000 mg | Freq: Once | INTRAMUSCULAR | Status: AC
  Administered 2014-03-29: 60 mg via INTRAMUSCULAR

## 2014-03-29 MED ORDER — LEVONORGESTREL-ETHINYL ESTRAD 0.15-30 MG-MCG PO TABS: ORAL_TABLET | ORAL | Status: DC

## 2014-03-29 MED ORDER — FLUTICASONE PROPIONATE HFA 110 MCG/ACT IN AERO: 2.0000 | INHALATION_SPRAY | Freq: Two times a day (BID) | RESPIRATORY_TRACT | Status: DC

## 2014-03-29 MED ORDER — ZOLMITRIPTAN 2.5 MG PO TABS: 2.5000 mg | ORAL_TABLET | ORAL | Status: DC | PRN

## 2014-03-29 MED ORDER — BUTALBITAL-ASPIRIN-CAFFEINE 50-325-40 MG PO CAPS: ORAL_CAPSULE | ORAL | Status: DC

## 2014-03-29 NOTE — Progress Notes (Signed)
   Subjective:    Patient ID: Angela Oliver, female    DOB: 03/26/1966, 48 y.o.   MRN: [id number]  HPI Pt presents to the clinic with sinus pressure, cough, migraine. For 6 days URI/allergy symptoms present. For last 3 days not able to sleep due to dry cough, sneezing and migraine. She has tried imitrex and zomig with no relief. Denies any fever, chills, nausea, ST. She does have bilateral ear congestion. Mucinex helped some.   Asthma- she stopped Qvar due to cost. Wants to try something comprable. No wheezing or SOB. Not having to use albuterol inhaler. Continues to use zyrtec daily   Review of Systems     Objective:   Physical Exam  Constitutional: She is oriented to person, place, and time. She appears well-developed and well-nourished.  HENT:  Head: Normocephalic and atraumatic.  Right Ear: External ear normal.  Left Ear: External ear normal.  Nose: Nose normal.  Mouth/Throat: Oropharynx is clear and moist.  Significant maxillary pressure to palpation.  Bilateral nasal turbinates red and swollen.  TM's clear bilaterally.   Eyes: Conjunctivae are normal.  Neck: Normal range of motion. Neck supple.  Pulmonary/Chest: Effort normal and breath sounds normal.  Lymphadenopathy:    She has no cervical adenopathy.  Neurological: She is alert and oriented to person, place, and time.  Skin: Skin is dry.  Psychiatric: She has a normal mood and affect. Her behavior is normal.          Assessment & Plan:  Sinusitis/asthma/migraine-Toradol 60mg  IM for migraine and solumedrol 125mg  IM for sinusitis. zpak was given. Refilled Singulair to restart. Will try flovent to see if more affordable. Continue to follow up. Gave HO for symptomatic care for sinusitis.    Pt requested to be switched to my pt in office today. Change was made.

## 2014-03-29 NOTE — Patient Instructions (Signed)
mucinex D twice a day.   Sinusitis Sinusitis is redness, soreness, and swelling (inflammation) of the paranasal sinuses. Paranasal sinuses are air pockets within the bones of your face (beneath the eyes, the middle of the forehead, or above the eyes). In healthy paranasal sinuses, mucus is able to drain out, and air is able to circulate through them by way of your nose. However, when your paranasal sinuses are inflamed, mucus and air can become trapped. This can allow bacteria and other germs to grow and cause infection. Sinusitis can develop quickly and last only a short time (acute) or continue over a long period (chronic). Sinusitis that lasts for more than 12 weeks is considered chronic.  CAUSES  Causes of sinusitis include:  Allergies.  Structural abnormalities, such as displacement of the cartilage that separates your nostrils (deviated septum), which can decrease the air flow through your nose and sinuses and affect sinus drainage.  Functional abnormalities, such as when the small hairs (cilia) that line your sinuses and help remove mucus do not work properly or are not present. SYMPTOMS  Symptoms of acute and chronic sinusitis are the same. The primary symptoms are pain and pressure around the affected sinuses. Other symptoms include:  Upper toothache.  Earache.  Headache.  Bad breath.  Decreased sense of smell and taste.  A cough, which worsens when you are lying flat.  Fatigue.  Fever.  Thick drainage from your nose, which often is green and may contain pus (purulent).  Swelling and warmth over the affected sinuses. DIAGNOSIS  Your caregiver will perform a physical exam. During the exam, your caregiver may:  Look in your nose for signs of abnormal growths in your nostrils (nasal polyps).  Tap over the affected sinus to check for signs of infection.  View the inside of your sinuses (endoscopy) with a special imaging device with a light attached (endoscope), which is  inserted into your sinuses. If your caregiver suspects that you have chronic sinusitis, one or more of the following tests may be recommended:  Allergy tests.  Nasal culture A sample of mucus is taken from your nose and sent to a lab and screened for bacteria.  Nasal cytology A sample of mucus is taken from your nose and examined by your caregiver to determine if your sinusitis is related to an allergy. TREATMENT  Most cases of acute sinusitis are related to a viral infection and will resolve on their own within 10 days. Sometimes medicines are prescribed to help relieve symptoms (pain medicine, decongestants, nasal steroid sprays, or saline sprays).  However, for sinusitis related to a bacterial infection, your caregiver will prescribe antibiotic medicines. These are medicines that will help kill the bacteria causing the infection.  Rarely, sinusitis is caused by a fungal infection. In theses cases, your caregiver will prescribe antifungal medicine. For some cases of chronic sinusitis, surgery is needed. Generally, these are cases in which sinusitis recurs more than 3 times per year, despite other treatments. HOME CARE INSTRUCTIONS   Drink plenty of water. Water helps thin the mucus so your sinuses can drain more easily.  Use a humidifier.  Inhale steam 3 to 4 times a day (for example, sit in the bathroom with the shower running).  Apply a warm, moist washcloth to your face 3 to 4 times a day, or as directed by your caregiver.  Use saline nasal sprays to help moisten and clean your sinuses.  Take over-the-counter or prescription medicines for pain, discomfort, or fever only as  directed by your caregiver. SEEK IMMEDIATE MEDICAL CARE IF:  You have increasing pain or severe headaches.  You have nausea, vomiting, or drowsiness.  You have swelling around your face.  You have vision problems.  You have a stiff neck.  You have difficulty breathing. MAKE SURE YOU:   Understand these  instructions.  Will watch your condition.  Will get help right away if you are not doing well or get worse. Document Released: 11/29/2005 Document Revised: 02/21/2012 Document Reviewed: 12/14/2011 [facility]ExitCare Patient Information 2014 [employer], [place].

## 2014-04-01 ENCOUNTER — Encounter (INDEPENDENT_AMBULATORY_CARE_PROVIDER_SITE_OTHER): Payer: PRIVATE HEALTH INSURANCE | Admitting: Physical Therapy

## 2014-04-01 DIAGNOSIS — M545 Low back pain, unspecified: Secondary | ICD-10-CM

## 2014-04-01 DIAGNOSIS — M2560 Stiffness of unspecified joint, not elsewhere classified: Secondary | ICD-10-CM

## 2014-04-02 ENCOUNTER — Ambulatory Visit: Payer: PRIVATE HEALTH INSURANCE | Admitting: Sports Medicine

## 2014-04-04 ENCOUNTER — Encounter (INDEPENDENT_AMBULATORY_CARE_PROVIDER_SITE_OTHER): Payer: PRIVATE HEALTH INSURANCE

## 2014-04-04 DIAGNOSIS — M545 Low back pain, unspecified: Secondary | ICD-10-CM

## 2014-04-04 DIAGNOSIS — M2560 Stiffness of unspecified joint, not elsewhere classified: Secondary | ICD-10-CM

## 2014-04-09 ENCOUNTER — Encounter: Payer: Self-pay | Admitting: Sports Medicine

## 2014-04-09 ENCOUNTER — Ambulatory Visit (INDEPENDENT_AMBULATORY_CARE_PROVIDER_SITE_OTHER): Payer: PRIVATE HEALTH INSURANCE | Admitting: Sports Medicine

## 2014-04-09 VITALS — BP 131/79 | HR 85 | Ht 64.0 in | Wt 177.0 lb

## 2014-04-09 DIAGNOSIS — M545 Low back pain, unspecified: Secondary | ICD-10-CM

## 2014-04-09 MED ORDER — DICLOFENAC EPOLAMINE 1.3 % TD PTCH: 1.0000 | MEDICATED_PATCH | Freq: Two times a day (BID) | TRANSDERMAL | Status: DC

## 2014-04-09 NOTE — Progress Notes (Signed)
  Subjective:    CC: Follow up  HPI: Low back pain: [name] continues to localize her back pain over the right sacroiliac joint, she continues to take her naproxen and Flexeril intermittently, and tells me that overall she is feeling okay he doesn't want to try anything else.  Past medical history, Surgical history, Family history not pertinant except as noted below, Social history, Allergies, and medications have been entered into the medical record, reviewed, and no changes needed.   Review of Systems: No fevers, chills, night sweats, weight loss, chest pain, or shortness of breath.   Objective:    General: Well Developed, well nourished, and in no acute distress.  Neuro: Alert and oriented x3, extra-ocular muscles intact, sensation grossly intact.  HEENT: Normocephalic, atraumatic, pupils equal round reactive to light, neck supple, no masses, no lymphadenopathy, thyroid nonpalpable.  Skin: Warm and dry, no rashes. Cardiac: Regular rate and rhythm, no murmurs rubs or gallops, no lower extremity edema.  Respiratory: Clear to auscultation bilaterally. Not using accessory muscles, speaking in full sentences. Low back: Tender to palpation over the right sacroiliac joint.  Impression and Recommendations:

## 2014-04-09 NOTE — Assessment & Plan Note (Signed)
Symptoms continue to be referable to the right sacroiliac joint. At this point she will continue her naproxen and Flexeril, and we will not make any major changes to her treatment protocol. She will continue home rehabilitation. I'm going to add some diclofenac patches. If continued pain, sacroiliac injection remains an option.

## 2014-04-26 ENCOUNTER — Encounter: Payer: Self-pay | Admitting: Sports Medicine

## 2014-04-26 ENCOUNTER — Ambulatory Visit (INDEPENDENT_AMBULATORY_CARE_PROVIDER_SITE_OTHER): Payer: PRIVATE HEALTH INSURANCE | Admitting: Sports Medicine

## 2014-04-26 ENCOUNTER — Telehealth: Payer: Self-pay | Admitting: *Deleted

## 2014-04-26 VITALS — BP 131/85 | HR 81 | Ht 64.0 in | Wt 180.0 lb

## 2014-04-26 DIAGNOSIS — M79609 Pain in unspecified limb: Secondary | ICD-10-CM

## 2014-04-26 DIAGNOSIS — M79672 Pain in left foot: Secondary | ICD-10-CM

## 2014-04-26 MED ORDER — TRAMADOL HCL 50 MG PO TABS: ORAL_TABLET | ORAL | Status: DC

## 2014-04-26 NOTE — Assessment & Plan Note (Addendum)
Persistent this time not at the calcaneal insertion of the plantar fascia. Pain now continues to be at the fourth metatarsal-cuboid joint, and today, is exquisitely tender over the navicular prominence. This does raise the suspicion for both a cuboid plus/minus a navicular stress fracture. Cast boot, MRI, return to go over MRI results. Tramadol for pain in the meantime.

## 2014-04-26 NOTE — Telephone Encounter (Signed)
PA approved for MRI left foot through [employer]. Approval #[id number], 04/29/14-05/30/14.

## 2014-04-26 NOTE — Progress Notes (Signed)
  Subjective:    CC:  Recurrent left foot pain  HPI: This pleasant 48 year old female has unfortunately had multiple issues with her left foot over the past several months. Initially this the form of plantar fascia fibromatosis and plantar fasciitis which was injected and improved temporarily. She did have some pain at the fourth metatarsal-cuboid joint, after failure of conservative measures, this was also injected and symptoms improved. Unfortunately she had recurrent pain and was referred to the surgeon, who recommended further physical therapy. She returns today with more pain that she now localizes over the navicular prominence, making it very difficult to walk. Pain is moderate, persistent.  Past medical history, Surgical history, Family history not pertinant except as noted below, Social history, Allergies, and medications have been entered into the medical record, reviewed, and no changes needed.   Review of Systems: No fevers, chills, night sweats, weight loss, chest pain, or shortness of breath.   Objective:    General: Well Developed, well nourished, and in no acute distress.  Neuro: Alert and oriented x3, extra-ocular muscles intact, sensation grossly intact.  HEENT: Normocephalic, atraumatic, pupils equal round reactive to light, neck supple, no masses, no lymphadenopathy, thyroid nonpalpable.  Skin: Warm and dry, no rashes. Cardiac: Regular rate and rhythm, no murmurs rubs or gallops, no lower extremity edema.  Respiratory: Clear to auscultation bilaterally. Not using accessory muscles, speaking in full sentences. Left Foot: No visible erythema or swelling. Range of motion is full in all directions. Strength is 5/5 in all directions. No hallux valgus. Pes planus. No abnormal callus noted. Tender to palpation both at the navicular prominence exquisitely, and moderately at the fourth metatarsal-cuboid joint. No tenderness to palpation of the calcaneal insertion of plantar  fascia. No pain at the Achilles insertion. No pain over the calcaneal bursa. No pain of the retrocalcaneal bursa. No hallux rigidus or limitus. No tenderness palpation over interphalangeal joints. No pain with compression of the metatarsal heads. Neurovascularly intact distally.  Impression and Recommendations:

## 2014-05-04 ENCOUNTER — Ambulatory Visit (HOSPITAL_BASED_OUTPATIENT_CLINIC_OR_DEPARTMENT_OTHER)
Admission: RE | Admit: 2014-05-04 | Discharge: 2014-05-04 | Disposition: A | Discharge status: Home / Self Care | Payer: No Typology Code available for payment source | Source: Ambulatory Visit | Attending: Sports Medicine | Admitting: Sports Medicine

## 2014-05-04 DIAGNOSIS — M722 Plantar fascial fibromatosis: Secondary | ICD-10-CM | POA: Insufficient documentation

## 2014-05-04 DIAGNOSIS — M659 Unspecified synovitis and tenosynovitis, unspecified site: Secondary | ICD-10-CM | POA: Insufficient documentation

## 2014-05-04 DIAGNOSIS — M79609 Pain in unspecified limb: Secondary | ICD-10-CM | POA: Insufficient documentation

## 2014-05-04 DIAGNOSIS — M25473 Effusion, unspecified ankle: Secondary | ICD-10-CM | POA: Insufficient documentation

## 2014-05-04 DIAGNOSIS — M25476 Effusion, unspecified foot: Secondary | ICD-10-CM | POA: Insufficient documentation

## 2014-05-07 ENCOUNTER — Encounter: Payer: Self-pay | Admitting: Sports Medicine

## 2014-05-07 ENCOUNTER — Ambulatory Visit (INDEPENDENT_AMBULATORY_CARE_PROVIDER_SITE_OTHER): Payer: PRIVATE HEALTH INSURANCE | Admitting: Sports Medicine

## 2014-05-07 VITALS — BP 135/86 | HR 87 | Ht 64.0 in | Wt 182.0 lb

## 2014-05-07 DIAGNOSIS — M79609 Pain in unspecified limb: Secondary | ICD-10-CM

## 2014-05-07 DIAGNOSIS — M79672 Pain in left foot: Secondary | ICD-10-CM

## 2014-05-07 MED ORDER — SERTRALINE HCL 25 MG PO TABS: 25.0000 mg | ORAL_TABLET | Freq: Every day | ORAL | Status: DC

## 2014-05-07 NOTE — Assessment & Plan Note (Signed)
MRI shows midfoot degenerative changes, peroneal tendinitis which is asymptomatic, and plantar fasciitis as expected, no stress injuries Continue boot immobilization for another week, this will take her to 3 weeks total, and then she can transition into an ASO brace which she has already. Okay to go back into work. Return to see me in 4 weeks.

## 2014-05-07 NOTE — Progress Notes (Signed)
  Subjective:    CC: MRI results  HPI: Nolia returns, she has been in a cast boot now for about 2 weeks, she continues to have pain that she localizes on the dorsal midfoot, and medially. MRI results will be dictated below however she does tell me her pain is significantly better.  Past medical history, Surgical history, Family history not pertinant except as noted below, Social history, Allergies, and medications have been entered into the medical record, reviewed, and no changes needed.   Review of Systems: No fevers, chills, night sweats, weight loss, chest pain, or shortness of breath.   Objective:    General: Well Developed, well nourished, and in no acute distress.  Neuro: Alert and oriented x3, extra-ocular muscles intact, sensation grossly intact.  HEENT: Normocephalic, atraumatic, pupils equal round reactive to light, neck supple, no masses, no lymphadenopathy, thyroid nonpalpable.  Skin: Warm and dry, no rashes. Cardiac: Regular rate and rhythm, no murmurs rubs or gallops, no lower extremity edema.  Respiratory: Clear to auscultation bilaterally. Not using accessory muscles, speaking in full sentences.  MRI shows midfoot osteoarthritis, plantar fasciitis, and perineal tendinitis, no evidence of stress injury on the medial ankle or T2 signal around the navicular.  Impression and Recommendations:

## 2014-05-22 ENCOUNTER — Other Ambulatory Visit: Payer: Self-pay | Admitting: Family Medicine

## 2014-06-04 ENCOUNTER — Encounter: Payer: Self-pay | Admitting: Sports Medicine

## 2014-06-04 ENCOUNTER — Ambulatory Visit (INDEPENDENT_AMBULATORY_CARE_PROVIDER_SITE_OTHER): Payer: PRIVATE HEALTH INSURANCE | Admitting: Sports Medicine

## 2014-06-04 VITALS — BP 137/88 | HR 86 | Ht 64.0 in | Wt 183.0 lb

## 2014-06-04 DIAGNOSIS — M79672 Pain in left foot: Secondary | ICD-10-CM

## 2014-06-04 DIAGNOSIS — M79609 Pain in unspecified limb: Secondary | ICD-10-CM

## 2014-06-04 MED ORDER — VALACYCLOVIR HCL 1 G PO TABS: ORAL_TABLET | ORAL | Status: DC

## 2014-06-04 MED ORDER — DICLOFENAC SODIUM 2 % TD SOLN: TRANSDERMAL | Status: DC

## 2014-06-04 NOTE — Progress Notes (Signed)
  Subjective:    CC: Followup  HPI: Left foot pain: Angela DroneBrenda has multiple issues with her foot including plantar fasciitis, peroneal tendinitis, and midfoot osteoarthritis. She has done extremely well with a short period of Cam Boot immobilization, and a few injections here and there. Overall she feels pretty good, she does want another pair of custom orthotics with a bigger arch  Herpes simplex: Needs a refill on Valtrex.  Past medical history, Surgical history, Family history not pertinant except as noted below, Social history, Allergies, and medications have been entered into the medical record, reviewed, and no changes needed.   Review of Systems: No fevers, chills, night sweats, weight loss, chest pain, or shortness of breath.   Objective:    General: Well Developed, well nourished, and in no acute distress.  Neuro: Alert and oriented x3, extra-ocular muscles intact, sensation grossly intact.  HEENT: Normocephalic, atraumatic, pupils equal round reactive to light, neck supple, no masses, no lymphadenopathy, thyroid nonpalpable.  Skin: Warm and dry, no rashes. Cardiac: Regular rate and rhythm, no murmurs rubs or gallops, no lower extremity edema.  Respiratory: Clear to auscultation bilaterally. Not using accessory muscles, speaking in full sentences. Left Foot: No visible erythema or swelling. Range of motion is full in all directions. Strength is 5/5 in all directions. No hallux valgus. No pes cavus or pes planus. No abnormal callus noted. No pain over the navicular prominence, or base of fifth metatarsal. No tenderness to palpation of the calcaneal insertion of plantar fascia. No pain at the Achilles insertion. No pain over the calcaneal bursa. No pain of the retrocalcaneal bursa. No tenderness to palpation over the tarsals, metatarsals, or phalanges. No hallux rigidus or limitus. No tenderness palpation over interphalangeal joints. No pain with compression of the metatarsal  heads. Neurovascularly intact distally.  Impression and Recommendations:

## 2014-06-04 NOTE — Assessment & Plan Note (Signed)
Feet are doing okay. She declines any further interventional treatment. She will come back for another set of custom orthotics, this time I can build the arch higher as we have had to place scaphoid pads into the orthotics. Again, MRI did show plantar fasciitis with peroneal tendinitis and midfoot degenerative changes. I am going to give her some topical diclofenac samples.

## 2014-06-21 ENCOUNTER — Other Ambulatory Visit: Payer: Self-pay | Admitting: Family Medicine

## 2014-07-04 ENCOUNTER — Ambulatory Visit (INDEPENDENT_AMBULATORY_CARE_PROVIDER_SITE_OTHER): Payer: PRIVATE HEALTH INSURANCE | Admitting: Sports Medicine

## 2014-07-04 ENCOUNTER — Encounter: Payer: Self-pay | Admitting: Sports Medicine

## 2014-07-04 VITALS — BP 137/88 | HR 92 | Ht 64.0 in | Wt 182.0 lb

## 2014-07-04 DIAGNOSIS — L237 Allergic contact dermatitis due to plants, except food: Secondary | ICD-10-CM | POA: Insufficient documentation

## 2014-07-04 DIAGNOSIS — L255 Unspecified contact dermatitis due to plants, except food: Secondary | ICD-10-CM

## 2014-07-04 MED ORDER — METHYLPREDNISOLONE ACETATE 80 MG/ML IJ SUSP
80.0000 mg | Freq: Once | INTRAMUSCULAR | Status: AC
  Administered 2014-07-04: 80 mg via INTRAMUSCULAR

## 2014-07-04 MED ORDER — PREDNISONE (PAK) 10 MG PO TABS: ORAL_TABLET | ORAL | Status: DC

## 2014-07-04 MED ORDER — TRIAMCINOLONE ACETONIDE 0.5 % EX CREA: 1.0000 "application " | TOPICAL_CREAM | Freq: Two times a day (BID) | CUTANEOUS | Status: DC

## 2014-07-04 MED ORDER — METHYLPREDNISOLONE SODIUM SUCC 125 MG IJ SOLR
125.0000 mg | Freq: Once | INTRAMUSCULAR | Status: AC
  Administered 2014-07-04: 125 mg via INTRAMUSCULAR

## 2014-07-04 NOTE — Progress Notes (Signed)
  Subjective:    CC: Rash  HPI: Work outside several days ago, now has an intensely pruritic rash over her face, arms, abdomen, chest. Intensely pruritic, symptoms are severe, persistent.  Past medical history, Surgical history, Family history not pertinant except as noted below, Social history, Allergies, and medications have been entered into the medical record, reviewed, and no changes needed.   Review of Systems: No fevers, chills, night sweats, weight loss, chest pain, or shortness of breath.   Objective:    General: Well Developed, well nourished, and in no acute distress.  Neuro: Alert and oriented x3, extra-ocular muscles intact, sensation grossly intact.  HEENT: Normocephalic, atraumatic, pupils equal round reactive to light, neck supple, no masses, no lymphadenopathy, thyroid nonpalpable.  Skin: Warm and dry, papulovesicular rash on the arms, chest, abdomen, and face. No mucocutaneous involvement. Cardiac: Regular rate and rhythm, no murmurs rubs or gallops, no lower extremity edema.  Respiratory: Clear to auscultation bilaterally. Not using accessory muscles, speaking in full sentences.   Solu-Medrol 125 mg, Depo-Medrol 80 mg given intramuscular here in the office.  Impression and Recommendations:

## 2014-07-04 NOTE — Assessment & Plan Note (Signed)
Solu-Medrol 125, Depo-Medrol 80 and for muscular. Prednisone taper for 12 days, triamcinolone topical. Return as needed for this.

## 2014-07-04 NOTE — Patient Instructions (Signed)
Poison Ivy Poison ivy is a inflammation of the skin (contact dermatitis) caused by touching the allergens on the leaves of the ivy plant following previous exposure to the plant. The rash usually appears 48 hours after exposure. The rash is usually bumps (papules) or blisters (vesicles) in a linear pattern. Depending on your own sensitivity, the rash may simply cause redness and itching, or it may also progress to blisters which may break open. These must be well cared for to prevent secondary bacterial (germ) infection, followed by scarring. Keep any open areas dry, clean, dressed, and covered with an antibacterial ointment if needed. The eyes may also get puffy. The puffiness is worst in the morning and gets better as the day progresses. This dermatitis usually heals without scarring, within 2 to 3 weeks without treatment. HOME CARE INSTRUCTIONS  Thoroughly wash with soap and water as soon as you have been exposed to poison ivy. You have about one half hour to remove the plant resin before it will cause the rash. This washing will destroy the oil or antigen on the skin that is causing, or will cause, the rash. Be sure to wash under your fingernails as any plant resin there will continue to spread the rash. Do not rub skin vigorously when washing affected area. Poison ivy cannot spread if no oil from the plant remains on your body. A rash that has progressed to weeping sores will not spread the rash unless you have not washed thoroughly. It is also important to wash any clothes you have been wearing as these may carry active allergens. The rash will return if you wear the unwashed clothing, even several days later. Avoidance of the plant in the future is the best measure. Poison ivy plant can be recognized by the number of leaves. Generally, poison ivy has three leaves with flowering branches on a single stem. Diphenhydramine may be purchased over the counter and used as needed for itching. Do not drive with  this medication if it makes you drowsy.Ask your caregiver about medication for children. SEEK MEDICAL CARE IF:  Open sores develop.  Redness spreads beyond area of rash.  You notice purulent (pus-like) discharge.  You have increased pain.  Other signs of infection develop (such as fever). Document Released: 11/26/2000 Document Revised: 02/21/2012 Document Reviewed: 05/09/2009 Exit[employer] Patient Information 2015 [employer]. This information is not intended to replace advice given to you by your health care provider. Make sure you discuss any questions you have with your health care provider.  

## 2014-07-08 ENCOUNTER — Ambulatory Visit (INDEPENDENT_AMBULATORY_CARE_PROVIDER_SITE_OTHER): Payer: PRIVATE HEALTH INSURANCE | Admitting: Sports Medicine

## 2014-07-08 ENCOUNTER — Encounter: Payer: Self-pay | Admitting: Sports Medicine

## 2014-07-08 VITALS — BP 139/87 | HR 71 | Ht 64.0 in | Wt 181.0 lb

## 2014-07-08 DIAGNOSIS — M722 Plantar fascial fibromatosis: Secondary | ICD-10-CM

## 2014-07-08 DIAGNOSIS — L255 Unspecified contact dermatitis due to plants, except food: Secondary | ICD-10-CM

## 2014-07-08 DIAGNOSIS — L237 Allergic contact dermatitis due to plants, except food: Secondary | ICD-10-CM

## 2014-07-08 NOTE — Assessment & Plan Note (Signed)
Custom orthotics as above. 

## 2014-07-08 NOTE — Assessment & Plan Note (Signed)
Resolved with high-dose steroids, finish prednisone taper.

## 2014-07-08 NOTE — Progress Notes (Signed)
    Patient was fitted for a : standard, cushioned, semi-rigid orthotic. The orthotic was heated and afterward the patient stood on the orthotic blank positioned on the orthotic stand. The patient was positioned in subtalar neutral position and 10 degrees of ankle dorsiflexion in a weight bearing stance. After completion of molding, a stable base was applied to the orthotic blank. The blank was ground to a stable position for weight bearing. Size: 7 Base: Blue EVA Additional Posting and Padding: None The patient ambulated these, and they were very comfortable.  I spent 40 minutes with this patient, greater than 50% was face-to-face time counseling regarding the below diagnosis.   

## 2014-07-16 ENCOUNTER — Telehealth: Payer: Self-pay

## 2014-07-16 ENCOUNTER — Encounter: Payer: Self-pay | Admitting: Emergency Medicine

## 2014-07-16 ENCOUNTER — Emergency Department (INDEPENDENT_AMBULATORY_CARE_PROVIDER_SITE_OTHER)
Admission: EM | Admit: 2014-07-16 | Discharge: 2014-07-16 | Disposition: A | Discharge status: Home / Self Care | Payer: PRIVATE HEALTH INSURANCE | Source: Home / Self Care

## 2014-07-16 ENCOUNTER — Emergency Department (INDEPENDENT_AMBULATORY_CARE_PROVIDER_SITE_OTHER): Payer: PRIVATE HEALTH INSURANCE

## 2014-07-16 DIAGNOSIS — K5900 Constipation, unspecified: Secondary | ICD-10-CM

## 2014-07-16 DIAGNOSIS — N838 Other noninflammatory disorders of ovary, fallopian tube and broad ligament: Secondary | ICD-10-CM

## 2014-07-16 DIAGNOSIS — R300 Dysuria: Secondary | ICD-10-CM

## 2014-07-16 DIAGNOSIS — N949 Unspecified condition associated with female genital organs and menstrual cycle: Secondary | ICD-10-CM

## 2014-07-16 DIAGNOSIS — R109 Unspecified abdominal pain: Secondary | ICD-10-CM

## 2014-07-16 LAB — POCT CBC W AUTO DIFF (K'VILLE URGENT CARE)

## 2014-07-16 LAB — POCT URINALYSIS DIP (MANUAL ENTRY)
BILIRUBIN UA: NEGATIVE
BILIRUBIN UA: NEGATIVE
Glucose, UA: NEGATIVE
Leukocytes, UA: NEGATIVE
Nitrite, UA: NEGATIVE
PH UA: 8.5 (ref 5–8)
Protein Ur, POC: NEGATIVE
Spec Grav, UA: 1.01 (ref 1.005–1.03)
Urobilinogen, UA: 0.2 (ref 0–1)

## 2014-07-16 MED ORDER — IOHEXOL 300 MG/ML  SOLN
100.0000 mL | Freq: Once | INTRAMUSCULAR | Status: AC | PRN
  Administered 2014-07-16: 100 mL via INTRAVENOUS

## 2014-07-16 MED ORDER — PEG 3350-KCL-NA BICARB-NACL 420 G PO SOLR: 4000.0000 mL | Freq: Once | ORAL | Status: DC

## 2014-07-16 NOTE — ED Provider Notes (Signed)
CSN: [id number]     Arrival date & time 07/16/14  0920 History   None    Chief Complaint  Patient presents with  . Dizziness  . Abdominal Pain  . Constipation   (Consider location/radiation/quality/duration/timing/severity/associated sxs/prior Treatment) HPI Patient is a 48 year old female who presents to the clinic with 5 days of constipation. And over the past 6-7 days nausea, epigastric pain and fatigue. Denies and fever. She has tried Miralax for 3 days, and magnesium citrate the whole bottle, increasing her water and drinking cranberry juice. She denies taking any narcotics. She was seen by Dr.Thekkekendam for poision ivy and placed on predinsone taper and finished 2 days ago. She noticed the itchy rash is starting to come back again.   Past Medical History  Diagnosis Date  . Asthma   . Seasonal allergies   . Herpes simplex   . Migraines    Past Surgical History  Procedure Laterality Date  . Appendectomy    . Ovarian cyst removal     Family History  Problem Relation Age of Onset  . Asthma Other   . Cancer Mother     breast cancer  . Alcohol abuse Father   . Diabetes Father   . Hyperlipidemia Father    History  Substance Use Topics  . Smoking status: Never Smoker   . Smokeless tobacco: Not on file  . Alcohol Use: No   OB History   Grav Para Term Preterm Abortions TAB SAB Ect Mult Living                 Review of Systems  All other systems reviewed and are negative.   Allergies  Imitrex and Sulfonamide derivatives  Home Medications   Prior to Admission medications   Medication Sig Start Date End Date Taking? Authorizing Provider  acyclovir (ZOVIRAX) 400 MG tablet Take 1 tablet (400 mg total) by mouth 2 (two) times daily. 12/17/13   Agapito Gamesatherine D Metheney, MD  acyclovir ointment (ZOVIRAX) 5 % Apply topically every 3 (three) hours. 10/20/12   Ilka Lovick L Render Marley, PA-C  albuterol (PROVENTIL HFA;VENTOLIN HFA) 108 (90 BASE) MCG/ACT inhaler Inhale 2 puffs into the lungs  as needed.    Historical Provider, MD  beclomethasone (QVAR) 80 MCG/ACT inhaler Inhale 2 puffs into the lungs 2 (two) times daily.     Historical Provider, MD  butalbital-aspirin-caffeine [facility](FIORINAL) 50-325-40 MG per capsule CAPSULE BY MOUTH TWICE A DAY AS NEEDED FOR HEADACHE 03/29/14   Doretta Remmert L Noelle Hoogland, PA-C  cetirizine (ZYRTEC) 10 MG tablet Take 10 mg by mouth daily.    Historical Provider, MD  cyclobenzaprine (FLEXERIL) 10 MG tablet Take 1 tablet (10 mg total) by mouth 3 (three) times daily as needed for muscle spasms. 03/19/14   Monica Bectonhomas J Thekkekandam, MD  diclofenac (FLECTOR) 1.3 % PT[facility] Place 1 patch onto the skin 2 (two) times daily. Dispense one box. 04/09/14   Monica Bectonhomas J Thekkekandam, MD  Diclofenac Sodium (PENNSAID) 2 % SOLN Applied twice a day to affected area on foot 06/04/14   Monica Bectonhomas J Thekkekandam, MD  fluticasone (FLOVENT HFA) 110 MCG/ACT inhaler Inhale 2 puffs into the lungs 2 (two) times daily. 03/29/14   Jomarie LongsJade L Samantha Olivera, PA-C  levonorgestrel-ethinyl estradiol (ALTAVERA) 0.15-30 MG-MCG tablet TAKE 1 TABLET BY MOUTH ONCE A DAY 03/29/14   Yocelyn Brocious L Natajah Derderian, PA-C  montelukast (SINGULAIR) 10 MG tablet TAKE 1 TABLET BY MOUTH DAILY. 06/21/14   Nathaniel Yaden L Mija Effertz, PA-C  naproxen (NAPROSYN) 500 MG tablet Take 1 tablet (500 mg  total) by mouth 2 (two) times daily with a meal. 03/19/14   Monica Becton, MD  polyethylene glycol-electrolytes (NULYTELY/GOLYTELY) 420 G solution Take 4,000 mLs by mouth once. 07/16/14   Kaydence Menard L Lawrencia Mauney, PA-C  predniSONE (STERAPRED UNI-PAK) 10 MG tablet 12 day taper pack, use as directed 07/04/14   Monica Becton, MD  promethazine (PHENERGAN) 25 MG tablet Take 1 tablet (25 mg total) by mouth every 6 (six) hours as needed for nausea. 09/01/13   Doree Albee, MD  sertraline (ZOLOFT) 25 MG tablet Take 1 tablet (25 mg total) by mouth daily. 05/07/14   Monica Becton, MD  sertraline (ZOLOFT) 25 MG tablet TAKE 1 TABLET EVERY DAY 05/22/14   Naim Murtha L Vannie Hilgert, PA-C  SUMAtriptan  (IMITREX) 50 MG tablet TAKE 1 TABLET (50 MG TOTAL) BY MOUTH EVERY 2 (TWO) HOURS AS NEEDED. 02/15/14   Agapito Games, MD  traMADol (ULTRAM) 50 MG tablet 1-2 tabs by mouth Q8 hours, maximum 6 tabs per day. 04/26/14   Monica Becton, MD  triamcinolone cream (KENALOG) 0.5 % Apply 1 application topically 2 (two) times daily. To affected areas. 07/04/14   Monica Becton, MD  valACYclovir (VALTREX) 1000 MG tablet 2 tabs twice a day for one day. 06/04/14   Monica Becton, MD  ZOLMitriptan (ZOMIG) 2.5 MG tablet Take 1 tablet (2.5 mg total) by mouth as needed for migraine. 03/29/14   Ita Fritzsche L Lundy Cozart, PA-C   BP 125/84  Pulse 92  Temp(Src) 97.9 F (36.6 C) (Oral)  Resp 18  Ht 5\' 4"  (1.626 m)  Wt 180 lb (81.647 kg)  BMI 30.88 kg/m2  SpO2 100%  LMP 07/14/2014 Physical Exam  Constitutional: She is oriented to person, place, and time. She appears well-developed and well-nourished.  HENT:  Head: Normocephalic and atraumatic.  Right Ear: External ear normal.  Left Ear: External ear normal.  Nose: Nose normal.  Mouth/Throat: Oropharynx is clear and moist.  Eyes: Conjunctivae are normal. Right eye exhibits no discharge. Left eye exhibits no discharge.  Neck: Normal range of motion. Neck supple.  Cardiovascular: Normal rate, regular rhythm and normal heart sounds.   Pulmonary/Chest: Effort normal and breath sounds normal. She has no wheezes.  No CVA tenderness.   Abdominal:  Difficult for pt to move due to pain.  Generalized tenderness over entire abdomen worse over bilateral lower abdomen.  Mild guarding present.  No rebound.   Appendix has been surgically removed.   Lymphadenopathy:    She has no cervical adenopathy.  Neurological: She is alert and oriented to person, place, and time.  Skin: Skin is dry.  Psychiatric: She has a normal mood and affect. Her behavior is normal.    ED Course  Procedures (including critical care time) Labs Review Labs Reviewed  COMPLETE  METABOLIC PANEL WITH GFR - Abnormal; Notable for the following:    Potassium 5.4 (*)    Calcium 8.3 (*)    All other components within normal limits   Narrative:    Performed at:  [facility]                [address]                [place], [place] [address]  URINE CULTURE  HEMOGLOBIN A1C   Narrative:    Performed at:  [facility]                [address], [address]  [place], [place] [address]  POCT CBC W AUTO DIFF ([facility])  POCT URINALYSIS DIP (MANUAL ENTRY)    Imaging Review Ct Abdomen Pelvis W Contrast  07/16/2014   CLINICAL DATA:  Abdominal pain and tenderness. Bloating and constipation.  EXAM: CT ABDOMEN AND PELVIS WITH CONTRAST  TECHNIQUE: Multidetector CT imaging of the abdomen and pelvis was performed using the standard protocol following bolus administration of intravenous contrast.  CONTRAST:  OMNIPAQUE IOHEXOL 300 MG/ML  SOLN  COMPARISON:  None.  FINDINGS: The lung bases are clear without focal nodule, mass, or airspace disease. The heart size is normal. No significant pleural or pericardial effusion is present.  Mild fatty infiltration of the liver is suggested. No focal hepatic lesions are present. The spleen is within normal limits. The stomach, duodenum, and pancreas are normal. The common bile duct and gallbladder are within normal limits. The adrenal glands are normal bilaterally. The kidneys and ureters are within normal limits. Urinary bladder is unremarkable.  The rectus sigmoid colon is within normal limits. The remainder the colon is unremarkable. The appendix is surgically absent. The small bowel is within normal limits. Contrast can be seen nearly to the terminal ileum.  No significant adenopathy or free fluid is present. The uterus is within normal limits. A hypodense lesion in the left adnexa measures 2.8 x 1.8 x 2.9 cm with slight heterogeneity. This likely represents a cyst.  The bone windows are  unremarkable.  IMPRESSION: 1. 2.9 cm heterogeneous cyst in the left adnexa. The correlate with focal tenderness. This is likely within normal limits. 2. No other acute or focal lesion to explain the patient's pain.   Electronically Signed   By: Gennette Pac M.D.   On: 07/16/2014 11:18     MDM   1. Constipation, unspecified constipation type   2. Abdominal pain, unspecified abdominal location   3. Dysuria   4. Adnexal cyst    CBC- WBC(11.3), HgB (14.4), Plt(197) UA positive for trace blood. Will culture.  Ordered CMP, A1C.  CT done with 2.9cm cyst in left adnexa. Will continue to follow and treat for pain. Pt is on her menstrual cycle. Negative for any obstructions or diverticulitis.  At this point due to constipation do not want to give pt narcotics for pain. Can take tylenol. Stay away from NSAIDs.  Start taking omeprazole daily for any acid reflux that could be worsening pain.  Drink tonight golytely to have a bowel movement.  Written out of work for 2 days.  Follow up at PCP office in 2 days or if symptoms worsening.  Red flags of worsening symptoms discussed and pt voiced understanding.     Jomarie Longs, PA-C 07/17/14 [phone]

## 2014-07-16 NOTE — Discharge Instructions (Signed)

## 2014-07-16 NOTE — Telephone Encounter (Signed)
Patient called request a rx for constipation sent to her pharmacy.Angela Oliver. Angela Oliver,CMA

## 2014-07-16 NOTE — ED Notes (Signed)
Pt c/o constipation with SOB, fatigue, bilateral upper abd pain, and nausea x 6-7 days. Denies fever.

## 2014-07-16 NOTE — Telephone Encounter (Signed)
She has been seen in urgent care.

## 2014-07-17 LAB — COMPLETE METABOLIC PANEL WITH GFR
ALBUMIN: 3.7 g/dL (ref 3.5–5.2)
ALT: 25 U/L (ref 0–35)
AST: 21 U/L (ref 0–37)
Alkaline Phosphatase: 46 U/L (ref 39–117)
BUN: 13 mg/dL (ref 6–23)
CO2: 25 meq/L (ref 19–32)
Calcium: 8.3 mg/dL — ABNORMAL LOW (ref 8.4–10.5)
Chloride: 101 mEq/L (ref 96–112)
Creat: 0.76 mg/dL (ref 0.50–1.10)
GLUCOSE: 76 mg/dL (ref 70–99)
POTASSIUM: 5.4 meq/L — AB (ref 3.5–5.3)
SODIUM: 138 meq/L (ref 135–145)
TOTAL PROTEIN: 6.5 g/dL (ref 6.0–8.3)
Total Bilirubin: 0.7 mg/dL (ref 0.2–1.2)

## 2014-07-17 LAB — HEMOGLOBIN A1C
Hgb A1c MFr Bld: 5.5 % (ref <5.7)
Mean Plasma Glucose: 111 mg/dL (ref <117)

## 2014-07-17 MED ORDER — OMEPRAZOLE 40 MG PO CPDR: 40.0000 mg | DELAYED_RELEASE_CAPSULE | Freq: Every day | ORAL | Status: DC

## 2014-07-17 NOTE — ED Provider Notes (Signed)
Agree with exam, assessment, and plan.   Lattie HawStephen A Beese, MD 07/17/14 1420

## 2014-07-18 LAB — URINE CULTURE
Colony Count: NO GROWTH
ORGANISM ID, BACTERIA: NO GROWTH

## 2014-07-19 ENCOUNTER — Ambulatory Visit: Payer: PRIVATE HEALTH INSURANCE | Admitting: Physician Assistant

## 2014-07-19 ENCOUNTER — Telehealth: Payer: Self-pay | Admitting: Emergency Medicine

## 2014-07-19 NOTE — ED Notes (Signed)
Inquired about patient's status; encourage them to call with questions/concerns.  

## 2014-07-22 ENCOUNTER — Ambulatory Visit (INDEPENDENT_AMBULATORY_CARE_PROVIDER_SITE_OTHER): Payer: PRIVATE HEALTH INSURANCE | Admitting: Physician Assistant

## 2014-07-22 ENCOUNTER — Encounter: Payer: Self-pay | Admitting: Physician Assistant

## 2014-07-22 VITALS — BP 107/72 | HR 87 | Ht 64.0 in | Wt 183.4 lb

## 2014-07-22 DIAGNOSIS — L237 Allergic contact dermatitis due to plants, except food: Secondary | ICD-10-CM

## 2014-07-22 DIAGNOSIS — L255 Unspecified contact dermatitis due to plants, except food: Secondary | ICD-10-CM

## 2014-07-22 DIAGNOSIS — K5900 Constipation, unspecified: Secondary | ICD-10-CM

## 2014-07-22 DIAGNOSIS — R10812 Left upper quadrant abdominal tenderness: Secondary | ICD-10-CM

## 2014-07-22 DIAGNOSIS — K219 Gastro-esophageal reflux disease without esophagitis: Secondary | ICD-10-CM

## 2014-07-22 MED ORDER — CLOBETASOL PROPIONATE 0.05 % EX CREA: 1.0000 "application " | TOPICAL_CREAM | Freq: Two times a day (BID) | CUTANEOUS | Status: DC

## 2014-07-22 MED ORDER — SUCRALFATE 1 G PO TABS: 1.0000 g | ORAL_TABLET | Freq: Four times a day (QID) | ORAL | Status: DC

## 2014-07-22 MED ORDER — METHYLPREDNISOLONE SODIUM SUCC 125 MG IJ SOLR: 125.0000 mg | Freq: Once | INTRAMUSCULAR | Status: DC

## 2014-07-22 MED ORDER — PANTOPRAZOLE SODIUM 40 MG PO TBEC: 40.0000 mg | DELAYED_RELEASE_TABLET | Freq: Every day | ORAL | Status: DC

## 2014-07-22 NOTE — Progress Notes (Signed)
   Subjective:    Patient ID: Angela ChouBrenda S Polimeni, female    DOB: 09/11/1966, 48 y.o.   MRN: [id number]  HPI Pt is here for follow up on UC visit for constipation and poison ivy dermatitis.   Constipation- resolved with golytely. Having bowel movements daily. No abdominal cramping.  Continues to have upper abdominal tenderness and discomfort. Has active acid reflux symptoms or bad taste in mouth and burning. Has tried to limit diet but continues to be a problem. Negative for black tarry stools or bright red blood. On omeprazole 40mg  daily.   Was seen over a week ago for poison ivy. Rash and pruritis went away with solu-medrol, depomedrol and 12 days of prednisone. Came back starting last week but worse this week. Very itchy. Triamcinolone cream not helping.    Review of Systems  All other systems reviewed and are negative.      Objective:   Physical Exam  Constitutional: She appears well-developed and well-nourished.  HENT:  Head: Normocephalic and atraumatic.  Cardiovascular: Normal rate, regular rhythm and normal heart sounds.   Pulmonary/Chest: Effort normal and breath sounds normal. She has no wheezes.  Abdominal: Soft. Bowel sounds are normal.  Tenderness over epigastric and left upper quadrant to palpation.   Skin:  Patches of erythema with some raised papular lesions. Not weeping. All over body(trunk, arm, wrist, chest)  Psychiatric: She has a normal mood and affect. Her behavior is normal.          Assessment & Plan:  Constipation- resolved.   GERD- will test for h.pylori and cmp. Stop omeprazole and start protonix daily. carafate given for next 5-7 days. Follow up as needed.   Poison ivy- shot of solumedrol 125mg  given in office today. clobetosol cream given for bid usage. Call if notimproving in 5 days.

## 2014-07-22 NOTE — Patient Instructions (Signed)
Stop omeprazole. Start protonix and carafate.   Shot of solumedrol 125mg  IM.  Clobetasol cream twice a day.

## 2014-07-22 NOTE — Addendum Note (Signed)
Addended by: Corliss SkainsPAINTER, Lamika Connolly on: 07/22/2014 12:09 PM   Modules accepted: Orders

## 2014-07-30 ENCOUNTER — Other Ambulatory Visit: Payer: Self-pay | Admitting: Sports Medicine

## 2014-08-18 ENCOUNTER — Other Ambulatory Visit: Payer: Self-pay | Admitting: Sports Medicine

## 2014-09-13 ENCOUNTER — Encounter: Payer: Self-pay | Admitting: Emergency Medicine

## 2014-09-13 ENCOUNTER — Emergency Department (INDEPENDENT_AMBULATORY_CARE_PROVIDER_SITE_OTHER)
Admission: EM | Admit: 2014-09-13 | Discharge: 2014-09-13 | Disposition: A | Discharge status: Home / Self Care | Payer: PRIVATE HEALTH INSURANCE | Source: Home / Self Care | Attending: Emergency Medicine | Admitting: Emergency Medicine

## 2014-09-13 DIAGNOSIS — G43001 Migraine without aura, not intractable, with status migrainosus: Secondary | ICD-10-CM

## 2014-09-13 MED ORDER — METHYLPREDNISOLONE SODIUM SUCC 125 MG IJ SOLR
125.0000 mg | Freq: Once | INTRAMUSCULAR | Status: AC
  Administered 2014-09-13: 125 mg via INTRAMUSCULAR

## 2014-09-13 NOTE — ED Provider Notes (Signed)
CSN: [id number]     Arrival date & time 09/13/14  1757 History   First MD Initiated Contact with Patient 09/13/14 1811     Chief Complaint  Patient presents with  . Migraine   (Consider location/radiation/quality/duration/timing/severity/associated sxs/prior Treatment) Patient is a 48 y.o. female presenting with migraines. The history is provided by the patient. No language interpreter was used.  Migraine This is a new problem. The current episode started more than 2 days ago. The problem occurs constantly. The problem has been gradually worsening. Associated symptoms include headaches. Nothing aggravates the symptoms. Nothing relieves the symptoms. She has tried nothing for the symptoms. The treatment provided no relief.    Past Medical History  Diagnosis Date  . Asthma   . Seasonal allergies   . Herpes simplex   . Migraines    Past Surgical History  Procedure Laterality Date  . Appendectomy    . Ovarian cyst removal     Family History  Problem Relation Age of Onset  . Asthma Other   . Cancer Mother     breast cancer  . Alcohol abuse Father   . Diabetes Father   . Hyperlipidemia Father    History  Substance Use Topics  . Smoking status: Never Smoker   . Smokeless tobacco: Not on file  . Alcohol Use: No   OB History   Grav Para Term Preterm Abortions TAB SAB Ect Mult Living                 Review of Systems  Neurological: Positive for headaches.  All other systems reviewed and are negative.   Allergies  Imitrex and Sulfonamide derivatives  Home Medications   Prior to Admission medications   Medication Sig Start Date End Date Taking? Authorizing Provider  acyclovir (ZOVIRAX) 400 MG tablet Take 1 tablet (400 mg total) by mouth 2 (two) times daily. 12/17/13   Agapito Gamesatherine D Metheney, MD  acyclovir ointment (ZOVIRAX) 5 % Apply topically every 3 (three) hours. 10/20/12   Jade L Breeback, PA-C  albuterol (PROVENTIL HFA;VENTOLIN HFA) 108 (90 BASE) MCG/ACT inhaler Inhale  2 puffs into the lungs as needed.    Historical Provider, MD  beclomethasone (QVAR) 80 MCG/ACT inhaler Inhale 2 puffs into the lungs 2 (two) times daily.     Historical Provider, MD  butalbital-aspirin-caffeine [facility](FIORINAL) 50-325-40 MG per capsule CAPSULE BY MOUTH TWICE A DAY AS NEEDED FOR HEADACHE 03/29/14   Jade L Breeback, PA-C  cetirizine (ZYRTEC) 10 MG tablet Take 10 mg by mouth daily.    Historical Provider, MD  clobetasol cream (TEMOVATE) 0.05 % Apply 1 application topically 2 (two) times daily. 07/22/14   Jade L Breeback, PA-C  cyclobenzaprine (FLEXERIL) 10 MG tablet TAKE 1 TABLET (10 MG TOTAL) BY MOUTH 3 (THREE) TIMES DAILY AS NEEDED FOR MUSCLE SPASMS. 07/30/14   Monica Bectonhomas J Thekkekandam, MD  diclofenac (FLECTOR) 1.3 % PTCH Place 1 patch onto the skin 2 (two) times daily. Dispense one box. 04/09/14   Monica Bectonhomas J Thekkekandam, MD  Diclofenac Sodium (PENNSAID) 2 % SOLN Applied twice a day to affected area on foot 06/04/14   Monica Bectonhomas J Thekkekandam, MD  fluticasone (FLOVENT HFA) 110 MCG/ACT inhaler Inhale 2 puffs into the lungs 2 (two) times daily. 03/29/14   Jomarie LongsJade L Breeback, PA-C  levonorgestrel-ethinyl estradiol (ALTAVERA) 0.15-30 MG-MCG tablet TAKE 1 TABLET BY MOUTH ONCE A DAY 03/29/14   Jade L Breeback, PA-C  montelukast (SINGULAIR) 10 MG tablet TAKE 1 TABLET BY MOUTH DAILY. 06/21/14  Jade L Breeback, PA-C  naproxen (NAPROSYN) 500 MG tablet TAKE 1 TABLET TWICE A DAY WITH A MEAL 08/20/14   Monica Becton, MD  pantoprazole (PROTONIX) 40 MG tablet Take 1 tablet (40 mg total) by mouth daily. 07/22/14   Jade L Breeback, PA-C  polyethylene glycol-electrolytes (NULYTELY/GOLYTELY) 420 G solution Take 4,000 mLs by mouth once. 07/16/14   Jade L Breeback, PA-C  predniSONE (STERAPRED UNI-PAK) 10 MG tablet 12 day taper pack, use as directed 07/04/14   Monica Becton, MD  promethazine (PHENERGAN) 25 MG tablet Take 1 tablet (25 mg total) by mouth every 6 (six) hours as needed for nausea. 09/01/13   Doree Albee,  MD  sertraline (ZOLOFT) 25 MG tablet Take 1 tablet (25 mg total) by mouth daily. 05/07/14   Monica Becton, MD  sertraline (ZOLOFT) 25 MG tablet TAKE 1 TABLET EVERY DAY 05/22/14   Jade L Breeback, PA-C  sucralfate (CARAFATE) 1 G tablet Take 1 tablet (1 g total) by mouth 4 (four) times daily. 07/22/14   Jade L Breeback, PA-C  SUMAtriptan (IMITREX) 50 MG tablet TAKE 1 TABLET (50 MG TOTAL) BY MOUTH EVERY 2 (TWO) HOURS AS NEEDED. 02/15/14   Agapito Games, MD  traMADol (ULTRAM) 50 MG tablet 1-2 tabs by mouth Q8 hours, maximum 6 tabs per day. 04/26/14   Monica Becton, MD  triamcinolone cream (KENALOG) 0.5 % Apply 1 application topically 2 (two) times daily. To affected areas. 07/04/14   Monica Becton, MD  valACYclovir (VALTREX) 1000 MG tablet 2 tabs twice a day for one day. 06/04/14   Monica Becton, MD  ZOLMitriptan (ZOMIG) 2.5 MG tablet Take 1 tablet (2.5 mg total) by mouth as needed for migraine. 03/29/14   Jade L Breeback, PA-C   BP 138/86  Pulse 80  Temp(Src) 99.2 F (37.3 C) (Oral)  Resp 16  Ht 5\' 4"  (1.626 m)  Wt 182 lb (82.555 kg)  BMI 31.22 kg/m2  SpO2 96%  LMP 09/03/2014 Physical Exam  Nursing note and vitals reviewed. Constitutional: She is oriented to person, place, and time. She appears well-developed and well-nourished.  HENT:  Head: Normocephalic and atraumatic.  Right Ear: External ear normal.  Left Ear: External ear normal.  Nose: Nose normal.  Mouth/Throat: Oropharynx is clear and moist.  Eyes: Conjunctivae and EOM are normal. Pupils are equal, round, and reactive to light.  Neck: Normal range of motion.  Cardiovascular: Normal rate and normal heart sounds.   Pulmonary/Chest: Effort normal.  Abdominal: Soft. She exhibits no distension.  Musculoskeletal: Normal range of motion.  Neurological: She is alert and oriented to person, place, and time.  Skin: Skin is warm.  Psychiatric: She has a normal mood and affect.    ED Course  Procedures  (including critical care time) Labs Review Labs Reviewed - No data to display  Imaging Review No results found.   MDM   1. Migraine without aura and with status migrainosus, not intractable    Solumedrol IM Work note Continue home medications     Elson Areas, [place] 09/13/14 1830

## 2014-09-13 NOTE — Discharge Instructions (Signed)

## 2014-09-13 NOTE — ED Notes (Signed)
Onset of migraine headache symptoms 3 days ago including, dizziness, sensitivity to light and sound, nausea, pain behind eyes, forehead area.  Also states that she gets the headaches when the weather changes and or around the time of her period.

## 2014-09-14 NOTE — ED Provider Notes (Signed)
Medical history/examination/treatment/procedure(s) were performed by non-physician provider and as supervising physician I was immediately available for consultation/collaboration.  Lajean Manesavid Massey, MD 09/14/14 2053

## 2014-09-15 ENCOUNTER — Other Ambulatory Visit: Payer: Self-pay | Admitting: Physician Assistant

## 2014-09-17 ENCOUNTER — Other Ambulatory Visit: Payer: Self-pay | Admitting: Family Medicine

## 2014-09-24 ENCOUNTER — Encounter: Payer: Self-pay | Admitting: Sports Medicine

## 2014-09-24 ENCOUNTER — Ambulatory Visit (INDEPENDENT_AMBULATORY_CARE_PROVIDER_SITE_OTHER): Payer: PRIVATE HEALTH INSURANCE | Admitting: Sports Medicine

## 2014-09-24 VITALS — BP 123/76 | HR 85 | Ht 64.0 in | Wt 183.0 lb

## 2014-09-24 DIAGNOSIS — G43719 Chronic migraine without aura, intractable, without status migrainosus: Secondary | ICD-10-CM

## 2014-09-24 NOTE — Assessment & Plan Note (Signed)
Filled out note specific for work.

## 2014-09-24 NOTE — Progress Notes (Signed)
  Subjective:    CC: Followup  HPI: Migraine headaches: Improved, patient needs a specific work note written.  Past medical history, Surgical history, Family history not pertinant except as noted below, Social history, Allergies, and medications have been entered into the medical record, reviewed, and no changes needed.   Review of Systems: No fevers, chills, night sweats, weight loss, chest pain, or shortness of breath.   Objective:    General: Well Developed, well nourished, and in no acute distress.  Neuro: Alert and oriented x3, extra-ocular muscles intact, sensation grossly intact.  HEENT: Normocephalic, atraumatic, pupils equal round reactive to light, neck supple, no masses, no lymphadenopathy, thyroid nonpalpable.  Skin: Warm and dry, no rashes. Cardiac: Regular rate and rhythm, no murmurs rubs or gallops, no lower extremity edema.  Respiratory: Clear to auscultation bilaterally. Not using accessory muscles, speaking in full sentences.  Impression and Recommendations:

## 2014-10-02 ENCOUNTER — Encounter: Payer: Self-pay | Admitting: Physician Assistant

## 2014-10-02 ENCOUNTER — Ambulatory Visit (INDEPENDENT_AMBULATORY_CARE_PROVIDER_SITE_OTHER): Payer: PRIVATE HEALTH INSURANCE | Admitting: Physician Assistant

## 2014-10-02 VITALS — BP 124/75 | HR 79 | Temp 98.2°F | Resp 16 | Ht 64.0 in | Wt 184.0 lb

## 2014-10-02 DIAGNOSIS — G43711 Chronic migraine without aura, intractable, with status migrainosus: Secondary | ICD-10-CM

## 2014-10-02 DIAGNOSIS — Z23 Encounter for immunization: Secondary | ICD-10-CM

## 2014-10-02 DIAGNOSIS — B009 Herpesviral infection, unspecified: Secondary | ICD-10-CM

## 2014-10-02 MED ORDER — ACYCLOVIR 400 MG PO TABS: 400.0000 mg | ORAL_TABLET | Freq: Two times a day (BID) | ORAL | Status: DC

## 2014-10-02 MED ORDER — SUMATRIPTAN SUCCINATE 100 MG PO TABS: 100.0000 mg | ORAL_TABLET | ORAL | Status: DC | PRN

## 2014-10-02 MED ORDER — ZOLMITRIPTAN 2.5 MG PO TABS: 2.5000 mg | ORAL_TABLET | ORAL | Status: DC | PRN

## 2014-10-02 NOTE — Progress Notes (Signed)
   Subjective:    Patient ID: Angela Oliver, female    DOB: 07/02/1966, 48 y.o.   MRN: [id number]  HPI Pt comes in to have FMLA paper filled out of migraines. Pt is still having 1-3 a month most respond to zomig or alternates with imitrex. Sometimes she will need fiorinal for migraine as well. If not responding she has to come to clinic or UC for injections. Not aware of any trigger.    Pt also needs refill on acyclovir for cold sores that she occasionally gets. She did not like valtrex it messed her stomach up.       Review of Systems  All other systems reviewed and are negative.      Objective:   Physical Exam  Constitutional: She is oriented to person, place, and time. She appears well-developed and well-nourished.  Cardiovascular: Normal rate, regular rhythm and normal heart sounds.   Pulmonary/Chest: Effort normal and breath sounds normal. She has no wheezes.  Neurological: She is alert and oriented to person, place, and time.  Skin: Skin is dry.  Psychiatric: She has a normal mood and affect. Her behavior is normal.          Assessment & Plan:  Migraines- FMLA filled out. Scanned in copy.  Discussed to keep migraine diary. Need to see if there is a trigger. Discussed preventative therapy. Pt would like to hold off today. Follow up in 1-2 months and can consider adding preventative on board. Certainly we do not want patient missing more than 2 days a month of work.   Herpes simplex- refilled acyclovir.   Flu shot given without complication.

## 2014-10-02 NOTE — Patient Instructions (Signed)
Keep migraine diary. Bring in to discuss preventative.

## 2014-11-15 ENCOUNTER — Ambulatory Visit: Payer: PRIVATE HEALTH INSURANCE | Admitting: Sports Medicine

## 2014-11-18 ENCOUNTER — Other Ambulatory Visit: Payer: Self-pay | Admitting: Physician Assistant

## 2014-12-03 ENCOUNTER — Other Ambulatory Visit: Payer: Self-pay | Admitting: Family Medicine

## 2015-01-06 ENCOUNTER — Other Ambulatory Visit: Payer: Self-pay | Admitting: Family Medicine

## 2015-01-08 ENCOUNTER — Other Ambulatory Visit: Payer: Self-pay | Admitting: Physician Assistant

## 2015-02-17 ENCOUNTER — Ambulatory Visit (INDEPENDENT_AMBULATORY_CARE_PROVIDER_SITE_OTHER): Payer: PRIVATE HEALTH INSURANCE | Admitting: Physician Assistant

## 2015-02-17 ENCOUNTER — Encounter: Payer: Self-pay | Admitting: Physician Assistant

## 2015-02-17 VITALS — BP 135/82 | HR 87 | Temp 98.1°F | Ht 64.0 in | Wt 183.0 lb

## 2015-02-17 DIAGNOSIS — G43711 Chronic migraine without aura, intractable, with status migrainosus: Secondary | ICD-10-CM

## 2015-02-17 DIAGNOSIS — J069 Acute upper respiratory infection, unspecified: Secondary | ICD-10-CM

## 2015-02-17 DIAGNOSIS — B009 Herpesviral infection, unspecified: Secondary | ICD-10-CM | POA: Diagnosis not present

## 2015-02-17 DIAGNOSIS — K219 Gastro-esophageal reflux disease without esophagitis: Secondary | ICD-10-CM

## 2015-02-17 DIAGNOSIS — J4521 Mild intermittent asthma with (acute) exacerbation: Secondary | ICD-10-CM

## 2015-02-17 DIAGNOSIS — J309 Allergic rhinitis, unspecified: Secondary | ICD-10-CM

## 2015-02-17 MED ORDER — ACYCLOVIR 400 MG PO TABS: 400.0000 mg | ORAL_TABLET | Freq: Two times a day (BID) | ORAL | Status: DC

## 2015-02-17 MED ORDER — CYCLOBENZAPRINE HCL 10 MG PO TABS: ORAL_TABLET | ORAL | Status: DC

## 2015-02-17 MED ORDER — PREDNISONE 50 MG PO TABS: ORAL_TABLET | ORAL | Status: DC

## 2015-02-17 MED ORDER — SUMATRIPTAN SUCCINATE 50 MG PO TABS: ORAL_TABLET | ORAL | Status: DC

## 2015-02-17 MED ORDER — BECLOMETHASONE DIPROPIONATE 80 MCG/ACT IN AERS: 2.0000 | INHALATION_SPRAY | Freq: Two times a day (BID) | RESPIRATORY_TRACT | Status: DC

## 2015-02-17 MED ORDER — OMEPRAZOLE 40 MG PO CPDR: DELAYED_RELEASE_CAPSULE | ORAL | Status: DC

## 2015-02-17 MED ORDER — AZITHROMYCIN 250 MG PO TABS: ORAL_TABLET | ORAL | Status: DC

## 2015-02-17 MED ORDER — ALBUTEROL SULFATE 108 (90 BASE) MCG/ACT IN AEPB: 2.0000 | INHALATION_SPRAY | RESPIRATORY_TRACT | Status: DC | PRN

## 2015-02-17 MED ORDER — BUTALBITAL-ASPIRIN-CAFFEINE 50-325-40 MG PO CAPS: ORAL_CAPSULE | ORAL | Status: DC

## 2015-02-17 MED ORDER — MONTELUKAST SODIUM 10 MG PO TABS: 10.0000 mg | ORAL_TABLET | Freq: Every day | ORAL | Status: DC

## 2015-02-17 MED ORDER — SERTRALINE HCL 50 MG PO TABS: 50.0000 mg | ORAL_TABLET | Freq: Every day | ORAL | Status: DC

## 2015-02-17 NOTE — Progress Notes (Signed)
   Subjective:    Patient ID: Angela Oliver, female    DOB: 09/08/1966, 49 y.o.   MRN: [id number]  HPI Patient is a 49 year old female who presents to the clinic with upper respiratory symptoms that have been going on for over a week. Patient has a history of asthma. She does feel like her shortness of breath and wheezing is worsening. She does not have an albuterol rescue inhaler at this time. She is on Qvar daily. Her cough is mildly productive. She denies any fever, chills. She does have a sore throat, ear pain, sinus pressure. She has tried some Mucinex over-the-counter.  She is in need of medication refills today.   Review of Systems  All other systems reviewed and are negative.      Objective:   Physical Exam  Constitutional: She is oriented to person, place, and time. She appears well-developed and well-nourished.  HENT:  Head: Normocephalic and atraumatic.  Right Ear: External ear normal.  Left Ear: External ear normal.  Mouth/Throat: Oropharynx is clear and moist.  TMs clear bilaterally.  Bilateral maxillary and frontal sinus tenderness to palpation.  Bilateral nasal turbinates red and swollen.  Eyes: Conjunctivae are normal. Right eye exhibits no discharge. Left eye exhibits no discharge.  Neck: Normal range of motion. Neck supple.  Cardiovascular: Normal rate, regular rhythm and normal heart sounds.   Pulmonary/Chest: Effort normal.  Mild bilateral lung wheezing.  Lymphadenopathy:    She has no cervical adenopathy.  Neurological: She is alert and oriented to person, place, and time.  Skin: Skin is dry.  Psychiatric: She has a normal mood and affect. Her behavior is normal.          Assessment & Plan:  Acute upper respiratory infection with asthma exacerbation- treated with prednisone and zpak.  Refilled qvar. Gave respiclick to use 2 puffs as needed every 4-6 hours. Follow up as needed.   GERD- refilled omperazole for 1 year.  Herpes simplex- refilled  acyclovir as needed for outbreaks.   Migraines- refilled imitrex for as needed nightly us

## 2015-02-21 ENCOUNTER — Other Ambulatory Visit: Payer: Self-pay | Admitting: Physician Assistant

## 2015-02-26 ENCOUNTER — Other Ambulatory Visit: Payer: Self-pay | Admitting: Sports Medicine

## 2015-04-04 ENCOUNTER — Other Ambulatory Visit: Payer: Self-pay | Admitting: Sports Medicine

## 2015-04-21 ENCOUNTER — Other Ambulatory Visit: Payer: Self-pay | Admitting: Physician Assistant

## 2015-04-25 ENCOUNTER — Other Ambulatory Visit: Payer: Self-pay | Admitting: Physician Assistant

## 2015-06-13 ENCOUNTER — Other Ambulatory Visit: Payer: Self-pay | Admitting: Sports Medicine

## 2015-07-09 ENCOUNTER — Other Ambulatory Visit: Payer: Self-pay | Admitting: Sports Medicine

## 2015-07-16 ENCOUNTER — Encounter: Payer: Self-pay | Admitting: Family Medicine

## 2015-07-16 ENCOUNTER — Ambulatory Visit (INDEPENDENT_AMBULATORY_CARE_PROVIDER_SITE_OTHER): Payer: PRIVATE HEALTH INSURANCE | Admitting: Family Medicine

## 2015-07-16 VITALS — BP 136/85 | HR 93 | Ht 64.0 in | Wt 174.0 lb

## 2015-07-16 DIAGNOSIS — G43711 Chronic migraine without aura, intractable, with status migrainosus: Secondary | ICD-10-CM | POA: Diagnosis not present

## 2015-07-16 MED ORDER — DEXAMETHASONE SODIUM PHOSPHATE 4 MG/ML IJ SOLN
4.0000 mg | Freq: Once | INTRAMUSCULAR | Status: AC
  Administered 2015-07-16: 4 mg via INTRAMUSCULAR

## 2015-07-16 MED ORDER — PROMETHAZINE HCL 25 MG PO TABS: 25.0000 mg | ORAL_TABLET | Freq: Four times a day (QID) | ORAL | Status: DC | PRN

## 2015-07-16 MED ORDER — KETOROLAC TROMETHAMINE 60 MG/2ML IM SOLN
60.0000 mg | Freq: Once | INTRAMUSCULAR | Status: AC
  Administered 2015-07-16: 60 mg via INTRAMUSCULAR

## 2015-07-16 NOTE — Progress Notes (Signed)
Angela Oliver is a 49 y.o. female who presents to [facility]  today for migraine headache. Patient has a 1-1/2 day history of severe migraine headache. She notes pressure and pain and nausea and some vomiting. She is already tried her home medications including Imitrex that have not worked. She has not tried naproxen yet today. In the past she's done well with injection medicines when her migraines get this bad. No fevers or chills chest pains or palpitations weakness or numbness or loss of function.   Past Medical History  Diagnosis Date  . Asthma   . Seasonal allergies   . Herpes simplex   . Migraines    Past Surgical History  Procedure Laterality Date  . Appendectomy    . Ovarian cyst removal     History  Substance Use Topics  . Smoking status: Never Smoker   . Smokeless tobacco: Not on file  . Alcohol Use: No   ROS as above Medications: Current Outpatient Prescriptions  Medication Sig Dispense Refill  . acyclovir (ZOVIRAX) 400 MG tablet Take 1 tablet (400 mg total) by mouth 2 (two) times daily. 60 tablet 2  . acyclovir ointment (ZOVIRAX) 5 % Apply topically every 3 (three) hours. 15 g 1  . Albuterol Sulfate (PROAIR RESPICLICK) 108 (90 BASE) MCG/ACT AEPB Inhale 2 puffs into the lungs every 4 (four) hours as needed. 1 each 2  . ALTAVERA 0.15-30 MG-MCG tablet TAKE 1 TABLET BY MOUTH ONCE A DAY 28 tablet 11  . azithromycin (ZITHROMAX) 250 MG tablet Take 2 tablets now and then one tablet for 4 days. 6 tablet 0  . beclomethasone (QVAR) 80 MCG/ACT inhaler Inhale 2 puffs into the lungs 2 (two) times daily. 1 Inhaler 5  . butalbital-aspirin-caffeine (FIORINAL) 50-325-40 MG per capsule CAPSULE BY MOUTH TWICE A DAY AS NEEDED FOR HEADACHE 20 capsule 2  . cetirizine (ZYRTEC) 10 MG tablet Take 10 mg by mouth daily.    . cyclobenzaprine (FLEXERIL) 10 MG tablet TAKE 1/2-1 TABLETS BY MOUTH AT BEDTIME AS NEEDED FOR MUSCLE SPASMS 30 tablet 5  . meloxicam (MOBIC)  15 MG tablet TAKE 1 TO 2 TABLETS BY MOUTH EVERY DAY FOR 2 WEEKS THEN AS NEEDED FOR JOINT PAIN 90 tablet 3  . montelukast (SINGULAIR) 10 MG tablet Take 1 tablet (10 mg total) by mouth daily. 90 tablet 4  . naproxen (NAPROSYN) 500 MG tablet TAKE 1 TABLET TWICE A DAY WITH A MEAL 60 tablet 3  . naproxen (NAPROSYN) 500 MG tablet TAKE 1 TABLET TWICE A DAY WITH A MEAL 60 tablet 3  . omeprazole (PRILOSEC) 40 MG capsule TAKE 1 CAPSULE (40 MG TOTAL) BY MOUTH DAILY. 90 capsule 4  . promethazine (PHENERGAN) 25 MG tablet Take 1 tablet (25 mg total) by mouth every 6 (six) hours as needed for nausea. 30 tablet 0  . sertraline (ZOLOFT) 50 MG tablet Take 1 tablet (50 mg total) by mouth daily. 90 tablet 1  . SUMAtriptan (IMITREX) 50 MG tablet TAKE 1 TABLET (50 MG TOTAL) BY MOUTH EVERY 2 (TWO) HOURS AS NEEDED. 10 tablet 5  . traMADol (ULTRAM) 50 MG tablet 1-2 tabs by mouth Q8 hours, maximum 6 tabs per day. 90 tablet 0  . ZOLMitriptan (ZOMIG) 2.5 MG tablet Take 1 tablet (2.5 mg total) by mouth as needed for migraine. 10 tablet 2   No current facility-administered medications for this visit.   Allergies  Allergen Reactions  . Imitrex [Sumatriptan] Other (See Comments)  Severe nausea and abdominal pain  . Sulfonamide Derivatives      Exam:  BP 136/85 mmHg  Pulse 93  Ht 5\' 4"  (1.626 m)  Wt 174 lb (78.926 kg)  BMI 29.85 kg/m2 Gen: Well NAD HEENT: EOMI,  MMM Lungs: Normal work of breathing. CTABL Heart: RRR no MRG Abd: NABS, Soft. Nondistended, Nontender Exts: Brisk capillary refill, warm and well perfused.   Patient was given 60 mg IM Toradol, and a milligrams IM dexamethasone prior to discharge.  No results found for this or any previous visit (from the past 24 hour(s)). No results found.   Please see individual assessment and plan sections.

## 2015-07-16 NOTE — Assessment & Plan Note (Signed)
Treatment with injectable Toradol and dexamethasone today. Prescribed Phenergan and use Benadryl at home. Return as needed.

## 2015-07-16 NOTE — Patient Instructions (Signed)
Thank you for coming in today. Go home and take phenergan and  (2 pills) of benadryl and take a nap.  Go to the emergency room if your headache becomes excruciating or you have weakness or numbness or uncontrolled vomiting.   Recurrent Migraine Headache A migraine headache is an intense, throbbing pain on one or both sides of your head. Recurrent migraines keep coming back. A migraine can last for 30 minutes to several hours. CAUSES  The exact cause of a migraine headache is not always known. However, a migraine may be caused when nerves in the brain become irritated and release chemicals that cause inflammation. This causes pain. Certain things may also trigger migraines, such as:   Alcohol.  Smoking.  Stress.  Menstruation.  Aged cheeses.  Foods or drinks that contain nitrates, glutamate, aspartame, or tyramine.  Lack of sleep.  Chocolate.  Caffeine.  Hunger.  Physical exertion.  Fatigue.  Medicines used to treat chest pain (nitroglycerine), birth control pills, estrogen, and some blood pressure medicines. SYMPTOMS   Pain on one or both sides of your head.  Pulsating or throbbing pain.  Severe pain that prevents daily activities.  Pain that is aggravated by any physical activity.  Nausea, vomiting, or both.  Dizziness.  Pain with exposure to bright lights, loud noises, or activity.  General sensitivity to bright lights, loud noises, or smells. Before you get a migraine, you may get warning signs that a migraine is coming (aura). An aura may include:  Seeing flashing lights.  Seeing bright spots, halos, or zigzag lines.  Having tunnel vision or blurred vision.  Having feelings of numbness or tingling.  Having trouble talking.  Having muscle weakness. DIAGNOSIS  A recurrent migraine headache is often diagnosed based on:  Symptoms.  Physical examination.  A CT scan or MRI of your head. These imaging tests cannot diagnose migraines but can  help rule out other causes of headaches.  TREATMENT  Medicines may be given for pain and nausea. Medicines can also be given to help prevent recurrent migraines. HOME CARE INSTRUCTIONS  Only take over-the-counter or prescription medicines for pain or discomfort as directed by your health care provider. The use of long-term narcotics is not recommended.  Lie down in a dark, quiet room when you have a migraine.  Keep a journal to find out what may trigger your migraine headaches. For example, write down:  What you eat and drink.  How much sleep you get.  Any change to your diet or medicines.  Limit alcohol consumption.  Quit smoking if you smoke.  Get 7-9 hours of sleep, or as recommended by your health care provider.  Limit stress.  Keep lights dim if bright lights bother you and make your migraines worse. SEEK MEDICAL CARE IF:   You do not get relief from the medicines given to you.  You have a recurrence of pain.  You have a fever. SEEK IMMEDIATE MEDICAL CARE IF:  Your migraine becomes severe.  You have a stiff neck.  You have loss of vision.  You have muscular weakness or loss of muscle control.  You start losing your balance or have trouble walking.  You feel faint or pass out.  You have severe symptoms that are different from your first symptoms. MAKE SURE YOU:   Understand these instructions.  Will watch your condition.  Will get help right away if you are not doing well or get worse. Document Released: 08/24/2001 Document Revised: 04/15/2014 Document Reviewed: 08/06/2013 [employer]  Patient Information 2015 [employer]. This information is not intended to replace advice given to you by your health care provider. Make sure you discuss any questions you have with your health care provider.  

## 2015-08-02 ENCOUNTER — Other Ambulatory Visit: Payer: Self-pay | Admitting: Family Medicine

## 2015-09-02 ENCOUNTER — Other Ambulatory Visit: Payer: Self-pay | Admitting: Physician Assistant

## 2015-09-02 ENCOUNTER — Other Ambulatory Visit: Payer: Self-pay | Admitting: Sports Medicine

## 2015-09-16 ENCOUNTER — Encounter: Payer: Self-pay | Admitting: Family Medicine

## 2015-09-16 ENCOUNTER — Ambulatory Visit (INDEPENDENT_AMBULATORY_CARE_PROVIDER_SITE_OTHER): Payer: PRIVATE HEALTH INSURANCE | Admitting: Family Medicine

## 2015-09-16 ENCOUNTER — Ambulatory Visit (INDEPENDENT_AMBULATORY_CARE_PROVIDER_SITE_OTHER): Payer: PRIVATE HEALTH INSURANCE

## 2015-09-16 VITALS — BP 123/74 | HR 78 | Temp 98.6°F | Wt 179.0 lb

## 2015-09-16 DIAGNOSIS — J209 Acute bronchitis, unspecified: Secondary | ICD-10-CM | POA: Diagnosis not present

## 2015-09-16 DIAGNOSIS — R05 Cough: Secondary | ICD-10-CM

## 2015-09-16 MED ORDER — IPRATROPIUM-ALBUTEROL 0.5-2.5 (3) MG/3ML IN SOLN
3.0000 mL | Freq: Once | RESPIRATORY_TRACT | Status: AC
  Administered 2015-09-16: 3 mL via RESPIRATORY_TRACT

## 2015-09-16 MED ORDER — PREDNISONE 10 MG PO TABS: 30.0000 mg | ORAL_TABLET | Freq: Every day | ORAL | Status: DC

## 2015-09-16 MED ORDER — ALBUTEROL SULFATE 108 (90 BASE) MCG/ACT IN AEPB: 2.0000 | INHALATION_SPRAY | RESPIRATORY_TRACT | Status: DC | PRN

## 2015-09-16 MED ORDER — IPRATROPIUM BROMIDE 0.06 % NA SOLN
2.0000 | Freq: Four times a day (QID) | NASAL | Status: DC
Start: 2015-09-16 — End: 2016-05-03

## 2015-09-16 MED ORDER — PROMETHAZINE-DM 6.25-15 MG/5ML PO SYRP: 5.0000 mL | ORAL_SOLUTION | Freq: Four times a day (QID) | ORAL | Status: DC | PRN

## 2015-09-16 NOTE — Addendum Note (Signed)
Addended by: Minna Antis T on: 09/16/2015 03:19 PM   Modules accepted: Orders

## 2015-09-16 NOTE — Progress Notes (Signed)
Angela Oliver is a 49 y.o. female who presents to [name]  today for cough congestion runny nose sore throat. Symptoms present for 3 days. Patient has tried some over-the-counter medications which have helped a bit. She denies any fevers chills nausea vomiting or diarrhea. Her symptoms are consistent with previous episodes of bronchitis. She has well-controlled asthma. She denies any vomiting or diarrhea. In the past she notes that she's done well with promethazine and dextromethorphan cough syrup.   Past Medical History  Diagnosis Date  . Asthma   . Seasonal allergies   . Herpes simplex   . Migraines    Past Surgical History  Procedure Laterality Date  . Appendectomy    . Ovarian cyst removal     Social History  Substance Use Topics  . Smoking status: Never Smoker   . Smokeless tobacco: Not on file  . Alcohol Use: No   family history includes Alcohol abuse in her father; Asthma in her other; Cancer in her mother; Diabetes in her father; Hyperlipidemia in her father.  ROS as above Medications: Current Outpatient Prescriptions  Medication Sig Dispense Refill  . acyclovir (ZOVIRAX) 400 MG tablet Take 1 tablet (400 mg total) by mouth 2 (two) times daily. 60 tablet 2  . acyclovir ointment (ZOVIRAX) 5 % Apply topically every 3 (three) hours. 15 g 1  . Albuterol Sulfate (PROAIR RESPICLICK) 108 (90 BASE) MCG/ACT AEPB Inhale 2 puffs into the lungs every 4 (four) hours as needed. 1 each 2  . ALTAVERA 0.15-30 MG-MCG tablet TAKE 1 TABLET BY MOUTH ONCE A DAY 28 tablet 11  . beclomethasone (QVAR) 80 MCG/ACT inhaler Inhale 2 puffs into the lungs 2 (two) times daily. 1 Inhaler 5  . butalbital-aspirin-caffeine (FIORINAL) 50-325-40 MG per capsule CAPSULE BY MOUTH TWICE A DAY AS NEEDED FOR HEADACHE 20 capsule 2  . cetirizine (ZYRTEC) 10 MG tablet Take 10 mg by mouth daily.    . cyclobenzaprine (FLEXERIL) 10 MG tablet TAKE 1/2-1 TABLETS BY MOUTH AT BEDTIME AS  NEEDED FOR MUSCLE SPASMS 30 tablet 5  . meloxicam (MOBIC) 15 MG tablet TAKE 1 TO 2 TABLETS BY MOUTH EVERY DAY FOR 2 WEEKS THEN AS NEEDED FOR JOINT PAIN 90 tablet 3  . meloxicam (MOBIC) 15 MG tablet TAKE 1 TO 2 TABLETS BY MOUTH EVERY DAY FOR 2 WEEKS THEN AS NEEDED FOR JOINT PAIN 90 tablet 3  . montelukast (SINGULAIR) 10 MG tablet Take 1 tablet (10 mg total) by mouth daily. 90 tablet 4  . naproxen (NAPROSYN) 500 MG tablet TAKE 1 TABLET TWICE A DAY WITH A MEAL 60 tablet 3  . naproxen (NAPROSYN) 500 MG tablet TAKE 1 TABLET TWICE A DAY WITH A MEAL 60 tablet 3  . omeprazole (PRILOSEC) 40 MG capsule TAKE 1 CAPSULE (40 MG TOTAL) BY MOUTH DAILY. 90 capsule 4  . omeprazole (PRILOSEC) 40 MG capsule TAKE 1 CAPSULE (40 MG TOTAL) BY MOUTH DAILY. 30 capsule 1  . sertraline (ZOLOFT) 25 MG tablet TAKE 1 TABLET EVERY DAY 30 tablet 3  . sertraline (ZOLOFT) 50 MG tablet Take 1 tablet (50 mg total) by mouth daily. 90 tablet 1  . SUMAtriptan (IMITREX) 50 MG tablet TAKE 1 TABLET (50 MG TOTAL) BY MOUTH EVERY 2 (TWO) HOURS AS NEEDED. 10 tablet 5  . traMADol (ULTRAM) 50 MG tablet 1-2 tabs by mouth Q8 hours, maximum 6 tabs per day. 90 tablet 0  . ZOLMitriptan (ZOMIG) 2.5 MG tablet Take 1 tablet (2.5 mg total)  by mouth as needed for migraine. 10 tablet 2  . ipratropium (ATROVENT) 0.06 % nasal spray Place 2 sprays into both nostrils 4 (four) times daily. 15 mL 12  . predniSONE (DELTASONE) 10 MG tablet Take 3 tablets (30 mg total) by mouth daily. 15 tablet 0  . promethazine-dextromethorphan (PROMETHAZINE-DM) 6.25-15 MG/5ML syrup Take 5 mLs by mouth 4 (four) times daily as needed for cough. 180 mL 1   No current facility-administered medications for this visit.   Allergies  Allergen Reactions  . Imitrex [Sumatriptan] Other (See Comments)    Severe nausea and abdominal pain  . Sulfonamide Derivatives      Exam:  BP 123/74 mmHg  Pulse 78  Temp(Src) 98.6 F (37 C) (Oral)  Wt 179 lb (81.194 kg)  SpO2 99% Gen: Well  NAD HEENT: EOMI,  MMM clear nasal discharge. Posterior pharynx cobblestoning. Normal tympanic membranes bilaterally. Hoarse voice. Lungs: Normal work of breathing. Coarse breath sounds heard bilaterally Heart: RRR no MRG Abd: NABS, Soft. Nondistended, Nontender Exts: Brisk capillary refill, warm and well perfused.   A she was given a 2.5/0.5 mg DuoNeb nebulizer treatment, and felt better.  No results found for this or any previous visit (from the past 24 hour(s)). No results found.   Please see individual assessment and plan sections.

## 2015-09-16 NOTE — Patient Instructions (Signed)
Thank you for coming in today. Use prednisone and albuterol. Return if not better in a few days. Get an x-ray today. Call or go to the emergency room if you get worse, have trouble breathing, have chest pains, or palpitations.   Acute Bronchitis Bronchitis is inflammation of the airways that extend from the windpipe into the lungs (bronchi). The inflammation often causes mucus to develop. This leads to a cough, which is the most common symptom of bronchitis.  In acute bronchitis, the condition usually develops suddenly and goes away over time, usually in a couple weeks. Smoking, allergies, and asthma can make bronchitis worse. Repeated episodes of bronchitis may cause further lung problems.  CAUSES Acute bronchitis is most often caused by the same virus that causes a cold. The virus can spread from person to person (contagious) through coughing, sneezing, and touching contaminated objects. SIGNS AND SYMPTOMS   Cough.   Fever.   Coughing up mucus.   Body aches.   Chest congestion.   Chills.   Shortness of breath.   Sore throat.  DIAGNOSIS  Acute bronchitis is usually diagnosed through a physical exam. Your health care provider will also ask you questions about your medical history. Tests, such as chest X-rays, are sometimes done to rule out other conditions.  TREATMENT  Acute bronchitis usually goes away in a couple weeks. Oftentimes, no medical treatment is necessary. Medicines are sometimes given for relief of fever or cough. Antibiotic medicines are usually not needed but may be prescribed in certain situations. In some cases, an inhaler may be recommended to help reduce shortness of breath and control the cough. A cool mist vaporizer may also be used to help thin bronchial secretions and make it easier to clear the chest.  HOME CARE INSTRUCTIONS  Get plenty of rest.   Drink enough fluids to keep your urine clear or pale yellow (unless you have a medical condition that  requires fluid restriction). Increasing fluids may help thin your respiratory secretions (sputum) and reduce chest congestion, and it will prevent dehydration.   Take medicines only as directed by your health care provider.  If you were prescribed an antibiotic medicine, finish it all even if you start to feel better.  Avoid smoking and secondhand smoke. Exposure to cigarette smoke or irritating chemicals will make bronchitis worse. If you are a smoker, consider using nicotine gum or skin patches to help control withdrawal symptoms. Quitting smoking will help your lungs heal faster.   Reduce the chances of another bout of acute bronchitis by washing your hands frequently, avoiding people with cold symptoms, and trying not to touch your hands to your mouth, nose, or eyes.   Keep all follow-up visits as directed by your health care provider.  SEEK MEDICAL CARE IF: Your symptoms do not improve after 1 week of treatment.  SEEK IMMEDIATE MEDICAL CARE IF:  You develop an increased fever or chills.   You have chest pain.   You have severe shortness of breath.  You have bloody sputum.   You develop dehydration.  You faint or repeatedly feel like you are going to pass out.  You develop repeated vomiting.  You develop a severe headache. MAKE SURE YOU:   Understand these instructions.  Will watch your condition.  Will get help right away if you are not doing well or get worse. Document Released: 01/06/2005 Document Revised: 04/15/2014 Document Reviewed: 05/22/2013 [facility] Patient Information 2015 [place], [place]. This information is not intended to replace advice given to  you by your health care provider. Make sure you discuss any questions you have with your health care provider.  

## 2015-09-16 NOTE — Progress Notes (Signed)
Quick Note:  Xray is normal. ______ 

## 2015-09-16 NOTE — Assessment & Plan Note (Signed)
Likely bronchitis. Treat with prednisone and albuterol Atrovent nasal spray Phenergan cough syrup. X-ray pending. Follow-up if not better.

## 2015-09-18 ENCOUNTER — Telehealth: Payer: Self-pay | Admitting: Physician Assistant

## 2015-09-18 NOTE — Telephone Encounter (Signed)
Pt called. Her job is requiring that she bring in another letter. New Letter should read, "Angela Oliver is incapacitated for duty from 10/04-10-06" and may return to work on 10/07. Please omit "It is my medical opinion"(this was in the previous etter)She would like to access this letter through "MyChart" so that she can print it.  Thank you.

## 2015-09-24 NOTE — Telephone Encounter (Signed)
Change is OK

## 2015-09-24 NOTE — Telephone Encounter (Signed)
Dr. Denyse Amassorey please advise if this change is appropriate.

## 2015-09-24 NOTE — Telephone Encounter (Signed)
Letter sent.

## 2015-09-30 ENCOUNTER — Telehealth: Payer: Self-pay

## 2015-09-30 MED ORDER — LEVOFLOXACIN 500 MG PO TABS: 500.0000 mg | ORAL_TABLET | Freq: Every day | ORAL | Status: DC

## 2015-09-30 NOTE — Telephone Encounter (Signed)
[name] called and left a message reporting still being sick. She now has green sputum and would [place] [facility] [address] [place]. Please advise.

## 2015-09-30 NOTE — Addendum Note (Signed)
Addended by: Rodolph BongOREY, Wanita Derenzo S on: 09/30/2015 03:09 PM   Modules accepted: Orders

## 2015-09-30 NOTE — Telephone Encounter (Signed)
Rx for levaquin sent in. CMA will notify pt.

## 2015-11-12 ENCOUNTER — Other Ambulatory Visit: Payer: Self-pay | Admitting: Physician Assistant

## 2015-11-24 ENCOUNTER — Encounter: Payer: Self-pay | Admitting: Physician Assistant

## 2015-11-24 ENCOUNTER — Ambulatory Visit (INDEPENDENT_AMBULATORY_CARE_PROVIDER_SITE_OTHER): Payer: PRIVATE HEALTH INSURANCE | Admitting: Physician Assistant

## 2015-11-24 VITALS — BP 126/78 | HR 74 | Ht 64.0 in | Wt 181.0 lb

## 2015-11-24 DIAGNOSIS — G43711 Chronic migraine without aura, intractable, with status migrainosus: Secondary | ICD-10-CM

## 2015-11-24 MED ORDER — KETOROLAC TROMETHAMINE 60 MG/2ML IM SOLN
60.0000 mg | Freq: Once | INTRAMUSCULAR | Status: AC
  Administered 2015-11-24: 60 mg via INTRAMUSCULAR

## 2015-11-24 MED ORDER — BUTALBITAL-ASPIRIN-CAFFEINE 50-325-40 MG PO CAPS: ORAL_CAPSULE | ORAL | Status: DC

## 2015-11-24 MED ORDER — ZOLMITRIPTAN 2.5 MG PO TABS: 2.5000 mg | ORAL_TABLET | ORAL | Status: DC | PRN

## 2015-11-24 MED ORDER — SUMATRIPTAN SUCCINATE 50 MG PO TABS: ORAL_TABLET | ORAL | Status: DC

## 2015-11-24 NOTE — Progress Notes (Signed)
   Subjective:    Patient ID: Angela Oliver, female    DOB: 06/02/1966, 49 y.o.   MRN: [id number]  HPI  Pt is a 49 yo female who presents to the clinic with a migraine that will not resolve. Present since yesterday. If they stay for more than one days she usually needs a toradol shot per pt. She tried immitrex yesterday with no relief. She also took fiornal with no relief.  She alternates zomig and immitrex but she does not have any zomig. Needs refill. On average she is having 2-3 migraines a month and one really bad one that does not respond to rescue. She bring in paperwork to have filled out for intermittent leave at work for migraines.    Review of Systems  All other systems reviewed and are negative.      Objective:   Physical Exam  Constitutional: She is oriented to person, place, and time. She appears well-developed and well-nourished.  HENT:  Head: Normocephalic and atraumatic.  Right Ear: External ear normal.  Left Ear: External ear normal.  Nose: Nose normal.  Mouth/Throat: Oropharynx is clear and moist. No oropharyngeal exudate.  Eyes: Conjunctivae and EOM are normal. Pupils are equal, round, and reactive to light.  Neck: Normal range of motion. Neck supple.  Cardiovascular: Normal rate, regular rhythm and normal heart sounds.   Pulmonary/Chest: Effort normal and breath sounds normal.  Lymphadenopathy:    She has no cervical adenopathy.  Neurological: She is alert and oriented to person, place, and time.  Psychiatric: She has a normal mood and affect. Her behavior is normal.          Assessment & Plan:  Migraine- discussed need for prevention. Coming in Wednesday to further discuss. topamax would be a great option. She is concerned about weight gain with amitriptyline. Refilled zomig. Pt left paperwork to fill out for work to be out 2-3 days no more than twice a month. Toradol 60mg  IM given in office today.

## 2015-11-26 ENCOUNTER — Other Ambulatory Visit: Payer: Self-pay | Admitting: Physician Assistant

## 2015-11-26 ENCOUNTER — Encounter: Payer: Self-pay | Admitting: Physician Assistant

## 2015-11-26 ENCOUNTER — Ambulatory Visit (INDEPENDENT_AMBULATORY_CARE_PROVIDER_SITE_OTHER): Payer: PRIVATE HEALTH INSURANCE | Admitting: Physician Assistant

## 2015-11-26 ENCOUNTER — Other Ambulatory Visit: Payer: Self-pay | Admitting: Sports Medicine

## 2015-11-26 VITALS — BP 142/80 | HR 86 | Ht 64.0 in | Wt 182.0 lb

## 2015-11-26 DIAGNOSIS — Z833 Family history of diabetes mellitus: Secondary | ICD-10-CM

## 2015-11-26 DIAGNOSIS — M5412 Radiculopathy, cervical region: Secondary | ICD-10-CM

## 2015-11-26 DIAGNOSIS — R202 Paresthesia of skin: Secondary | ICD-10-CM

## 2015-11-26 DIAGNOSIS — G43711 Chronic migraine without aura, intractable, with status migrainosus: Secondary | ICD-10-CM | POA: Diagnosis not present

## 2015-11-26 LAB — POCT GLYCOSYLATED HEMOGLOBIN (HGB A1C): HEMOGLOBIN A1C: 5.2

## 2015-11-26 MED ORDER — TOPIRAMATE 50 MG PO TABS: 50.0000 mg | ORAL_TABLET | Freq: Two times a day (BID) | ORAL | Status: DC

## 2015-11-26 MED ORDER — PREDNISONE 10 MG (21) PO TBPK: ORAL_TABLET | ORAL | Status: DC

## 2015-11-26 NOTE — Progress Notes (Signed)
   Subjective:    Patient ID: Angela Oliver, female    DOB: 03/27/1966, 49 y.o.   MRN: [id number]  HPI  Pt presents to the clinic with main concern of bilateral hand numbness for last 2 months. Starts from both elbows and down into thumb, index and middle finger. Nothing tried. No injury. Worse in the morning when she wakes up. Denies any neck or shoulder pain. She does have some upper back tightness. She has a job full of lifting and pulling all day long.   Does not have migraine today. She is having at least 2-3 a week. Most controlled with rescue. Only about once or twice a month uncontrolled. Wants to talk about preventative. Never tried anything.     Review of Systems  All other systems reviewed and are negative.      Objective:   Physical Exam  Constitutional: She is oriented to person, place, and time. She appears well-developed and well-nourished.  HENT:  Head: Normocephalic and atraumatic.  Cardiovascular: Normal rate, regular rhythm and normal heart sounds.   Pulmonary/Chest: Effort normal and breath sounds normal.  Musculoskeletal:  NROM of bilateral hands, elbows, and shoulders.  Strength 5/5 bilateral upper extremities.  Negative phalens and tinels bilaterally.  Positive finkelstein bilaterally but much more present on right.  Pain above lateral epicondyle of right elbow to palpation.   Neurological: She is alert and oriented to person, place, and time.  Psychiatric: She has a normal mood and affect. Her behavior is normal.          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 Has a family hx of diabetes. Reassured numbness is not coming for diabetic neuropathy. Unclear exact etiology.  Will get cervical spine xray. Certainly could represent some cervical radiculopathy. Positive finklestein worse on right. Could be some tendonitis in combination with another pathology. Placed in splint to wear as often as can for rest. Symptoms occuring from  elbow down. Also could be some cubital tunnel. Has the same c6,c7 nerve distrubution as carpel tunnel but not presenting exactly like carpel tunnel.  Will give prednisone taper. mobic daily for next 2 weeks. Tenosynovitis exercises given.  If not improving consider sports medicine.   Migraines- filled out FMLA for intermittent leave for migraine exacerbation. Started topamax taper up to 50mg  bid. immitrex and zomig to alternate as needed for rescue. Flexeril at bedtime for muscle spasms. Follow up in 4-6 weeks.

## 2015-11-26 NOTE — Patient Instructions (Addendum)
De Quervain Tenosynovitis °Tendons attach muscles to bones. They also help with joint movements. When tendons become irritated or swollen, it is called tendinitis. °The extensor pollicis brevis (EPB) tendon connects the EPB muscle to a bone that is near the base of the thumb. The EPB muscle helps to straighten and extend the thumb. De Quervain tenosynovitis is a condition in which the EPB tendon lining (sheath) becomes irritated, thickened, and swollen. This condition is sometimes called stenosing tenosynovitis. This condition causes pain on the thumb side of the back of the wrist. °CAUSES °Causes of this condition include: °· Activities that repeatedly cause your thumb and wrist to extend. °· A sudden increase in activity or change in activity that affects your wrist. °RISK FACTORS: °This condition is more likely to develop in: °· Females. °· People who have diabetes. °· Women who have recently given birth. °· People who are over 40 years of age. °· People who do activities that involve repeated hand and wrist motions, such as tennis, racquetball, volleyball, gardening, and taking care of children. °· People who do heavy labor. °· People who have poor wrist strength and flexibility. °· People who do not warm up properly before activities. °SYMPTOMS °Symptoms of this condition include: °· Pain or tenderness over the thumb side of the back of the wrist when your thumb and wrist are not moving. °· Pain that gets worse when you straighten your thumb or extend your thumb or wrist. °· Pain when the injured area is touched. °· Locking or catching of the thumb joint while you bend and straighten your thumb. °· Decreased thumb motion due to pain. °· Swelling over the affected area. °DIAGNOSIS °This condition is diagnosed with a medical history and physical exam. Your health care provider will ask for details about your injury and ask about your symptoms. °TREATMENT °Treatment may include the use of icing and medicines to  reduce pain and swelling. You may also be advised to wear a splint or brace to limit your thumb and wrist motion. In less severe cases, treatment may also include working with a physical therapist to strengthen your wrist and calm the irritation around your EPB tendon sheath. In severe cases, surgery may be needed. °HOME CARE INSTRUCTIONS °If You Have a Splint or Brace: °· Wear it as told by your health care provider. Remove it only as told by your health care provider. °· Loosen the splint or brace if your fingers become numb and tingle, or if they turn cold and blue. °· Keep the splint or brace clean and dry. °Managing Pain, Stiffness, and Swelling  °· If directed, apply ice to the injured area. °¨ Put ice in a plastic bag. °¨ Place a towel between your skin and the bag. °¨ Leave the ice on for 20 minutes, 2-3 times per day. °· Move your fingers often to avoid stiffness and to lessen swelling. °· Raise (elevate) the injured area above the level of your heart while you are sitting or lying down. °General Instructions °· Return to your normal activities as told by your health care provider. Ask your health care provider what activities are safe for you. °· Take over-the-counter and prescription medicines only as told by your health care provider. °· Keep all follow-up visits as told by your health care provider. This is important. °· Do not drive or operate heavy machinery while taking prescription pain medicine. °SEEK MEDICAL CARE IF: °· Your pain, tenderness, or swelling gets worse, even if you have had   treatment. °· You have numbness or tingling in your wrist, hand, or fingers on the injured side. °  °This information is not intended to replace advice given to you by your health care provider. Make sure you discuss any questions you have with your health care provider. °  °Document Released: 11/29/2005 Document Revised: 08/20/2015 Document Reviewed: 02/04/2015 °Elsevier Interactive Patient Education ©2016  Elsevier Inc. ° °

## 2015-12-01 ENCOUNTER — Encounter: Payer: Self-pay | Admitting: Physician Assistant

## 2015-12-01 DIAGNOSIS — R202 Paresthesia of skin: Secondary | ICD-10-CM | POA: Insufficient documentation

## 2015-12-01 DIAGNOSIS — M5412 Radiculopathy, cervical region: Secondary | ICD-10-CM | POA: Insufficient documentation

## 2015-12-23 ENCOUNTER — Other Ambulatory Visit: Payer: Self-pay | Admitting: Physician Assistant

## 2016-01-13 ENCOUNTER — Telehealth: Payer: Self-pay | Admitting: *Deleted

## 2016-01-13 NOTE — Telephone Encounter (Signed)
Pt left vm stating that the topamax you started her on is "tearing her stomach up" & would like for you to send something else.  She thinks you mentioned propanolol but I didn't see any mention of it in your note.  Please advise.

## 2016-01-14 ENCOUNTER — Other Ambulatory Visit: Payer: Self-pay | Admitting: *Deleted

## 2016-01-14 MED ORDER — PROPRANOLOL HCL 40 MG PO TABS: 40.0000 mg | ORAL_TABLET | Freq: Two times a day (BID) | ORAL | Status: DC

## 2016-01-14 NOTE — Telephone Encounter (Signed)
Propranolol sent; pt notified.  Topamax added to allergy list.

## 2016-01-14 NOTE — Telephone Encounter (Signed)
Ok to stop topamax. Propranolol  bid #60 1 RF needs follow up after 1 month.

## 2016-02-11 ENCOUNTER — Other Ambulatory Visit: Payer: Self-pay | Admitting: Physician Assistant

## 2016-03-04 ENCOUNTER — Other Ambulatory Visit: Payer: Self-pay | Admitting: Physician Assistant

## 2016-03-12 ENCOUNTER — Other Ambulatory Visit: Payer: Self-pay | Admitting: Physician Assistant

## 2016-03-15 ENCOUNTER — Other Ambulatory Visit: Payer: Self-pay | Admitting: Sports Medicine

## 2016-04-08 ENCOUNTER — Other Ambulatory Visit: Payer: Self-pay | Admitting: Sports Medicine

## 2016-04-14 ENCOUNTER — Other Ambulatory Visit: Payer: Self-pay | Admitting: Sports Medicine

## 2016-04-26 ENCOUNTER — Other Ambulatory Visit: Payer: Self-pay | Admitting: Physician Assistant

## 2016-05-02 ENCOUNTER — Encounter: Payer: Self-pay | Admitting: Emergency Medicine

## 2016-05-02 ENCOUNTER — Emergency Department
Admission: EM | Admit: 2016-05-02 | Discharge: 2016-05-02 | Disposition: A | Discharge status: Home / Self Care | Payer: Federal, State, Local not specified - PPO | Source: Home / Self Care | Attending: Family Medicine | Admitting: Family Medicine

## 2016-05-02 ENCOUNTER — Telehealth (INDEPENDENT_AMBULATORY_CARE_PROVIDER_SITE_OTHER): Payer: Federal, State, Local not specified - PPO | Admitting: *Deleted

## 2016-05-02 ENCOUNTER — Ambulatory Visit (HOSPITAL_BASED_OUTPATIENT_CLINIC_OR_DEPARTMENT_OTHER)
Admission: RE | Admit: 2016-05-02 | Discharge: 2016-05-02 | Disposition: A | Discharge status: Home / Self Care | Payer: Federal, State, Local not specified - PPO | Source: Ambulatory Visit | Attending: Physician Assistant | Admitting: Physician Assistant

## 2016-05-02 DIAGNOSIS — K760 Fatty (change of) liver, not elsewhere classified: Secondary | ICD-10-CM | POA: Insufficient documentation

## 2016-05-02 DIAGNOSIS — K8020 Calculus of gallbladder without cholecystitis without obstruction: Secondary | ICD-10-CM

## 2016-05-02 DIAGNOSIS — R1011 Right upper quadrant pain: Secondary | ICD-10-CM

## 2016-05-02 LAB — POCT CBC W AUTO DIFF (K'VILLE URGENT CARE)

## 2016-05-02 MED ORDER — KETOROLAC TROMETHAMINE 60 MG/2ML IM SOLN
60.0000 mg | Freq: Once | INTRAMUSCULAR | Status: AC
  Administered 2016-05-02: 60 mg via INTRAMUSCULAR

## 2016-05-02 NOTE — Discharge Instructions (Signed)
You may take your tramadol as prescribed for pain.  You may also have acetaminophen 500mg  every 4-6 hours (first dose may be 1000mg ), and ibuprofen 400-600mg  every 6-8 hours.    You will be notified of your Ultrasound results by the end of today and further instructions will be given.   - If ultrasound is normal, you may follow up with your primary care provider this week.  Pain could be due to a muscles strain or kidney stone. If unable to follow up with your primary care provider or if symptoms worsen, please be sure to come back to [facility] to have your blood drawn for a CMP to measure your liver function, which also measures the health of your gallbladder and looks at kidney function and electrolytes.    -If there are stones found in your gallbladder but no signs of inflammation/infection, you should still get your labs drawn tomorrow and follow up with your primary care provider this week. You may benefit from an elective surgery to remove your gal bladder.    -If there are signs of infection you will likely be advised to check into the emergency department as emergent surgery may be needed.  More information will be given to you when results come back this evening.

## 2016-05-02 NOTE — ED Notes (Signed)
Pt c/o right upper abdominal pain that started x5 days ago. Dull constant pain that is worse with touch. Denies any other sxs.

## 2016-05-02 NOTE — ED Provider Notes (Signed)
CSN: [id number]     Arrival date & time 05/02/16  1508 History   First MD Initiated Contact with Patient 05/02/16 1542     Chief Complaint  Patient presents with  . Abdominal Pain   (Consider location/radiation/quality/duration/timing/severity/associated sxs/prior Treatment) HPI The pt is a 50yo female presenting to [facility] with c/o RUQ abdominal pain that started about 5 days ago, gradually worsening. Pain is worse with touch in that area and certain movements. Pain is aching and dull but sharp at times, 7/10. She has been taking tylenol and motrin with mild relief.  She does recall lifting something heavy the other day but no known known injuries. Denies fever, chills, n/v/d.  Abdominal surgical hx significant for ovarian cyst removal and appendectomy. She still has her gallbladder.   Past Medical History  Diagnosis Date  . Asthma   . Seasonal allergies   . Herpes simplex   . Migraines    Past Surgical History  Procedure Laterality Date  . Appendectomy    . Ovarian cyst removal     Family History  Problem Relation Age of Onset  . Asthma Other   . Cancer Mother     breast cancer  . Alcohol abuse Father   . Diabetes Father   . Hyperlipidemia Father    Social History  Substance Use Topics  . Smoking status: Never Smoker   . Smokeless tobacco: None  . Alcohol Use: No   OB History    No data available     Review of Systems  Constitutional: Negative for fever and chills.  Respiratory: Negative for chest tightness and shortness of breath.   Gastrointestinal: Positive for abdominal pain. Negative for nausea, vomiting and diarrhea.  Genitourinary: Positive for flank pain ( Right). Negative for dysuria, frequency and hematuria.  Musculoskeletal: Negative for myalgias and back pain.    Allergies  Imitrex; Sulfonamide derivatives; and Topamax  Home Medications   Prior to Admission medications   Medication Sig Start Date End Date Taking? Authorizing Provider  acyclovir  (ZOVIRAX) 400 MG tablet Take 1 tablet (400 mg total) by mouth 2 (two) times daily. 02/17/15   Jade L Breeback, PA-C  acyclovir ointment (ZOVIRAX) 5 % Apply topically every 3 (three) hours. 10/20/12   Jade L Breeback, PA-C  Albuterol Sulfate (PROAIR RESPICLICK) 108 (90 BASE) MCG/ACT AEPB Inhale 2 puffs into the lungs every 4 (four) hours as needed. 09/16/15   Rodolph Bong, MD  ALTAVERA 0.15-30 MG-MCG tablet TAKE 1 TABLET BY MOUTH ONCE A DAY 04/28/15   Jade L Breeback, PA-C  beclomethasone (QVAR) 80 MCG/ACT inhaler Inhale 2 puffs into the lungs 2 (two) times daily. 02/17/15   Jade L Breeback, PA-C  butalbital-aspirin-caffeine [facility]) 50-325-40 MG capsule CAPSULE BY MOUTH TWICE A DAY AS NEEDED FOR HEADACHE 11/24/15   Jade L Breeback, PA-C  cetirizine (ZYRTEC) 10 MG tablet Take 10 mg by mouth daily.    Historical Provider, MD  cyclobenzaprine (FLEXERIL) 10 MG tablet Take 1-2 -1 tablet by mouth at bedtime as needed for muscle spasms Needs f/u appt 03/15/16   Jomarie Longs, PA-C  cyclobenzaprine (FLEXERIL) 10 MG tablet TAKE 1 TABLET (10 MG TOTAL) BY MOUTH 3 (THREE) TIMES DAILY AS NEEDED FOR MUSCLE SPASMS. 04/14/16   Monica Becton, MD  ipratropium (ATROVENT) 0.06 % nasal spray Place 2 sprays into both nostrils 4 (four) times daily. 09/16/15   Rodolph Bong, MD  meloxicam (MOBIC) 15 MG tablet TAKE 1 TO 2 TABLETS BY MOUTH EVERY  DAY FOR 2 WEEKS THEN AS NEEDED FOR JOINT PAIN 09/02/15   Monica Bectonhomas J Thekkekandam, MD  montelukast (SINGULAIR) 10 MG tablet TAKE 1 TABLET (10 MG TOTAL) BY MOUTH DAILY. 04/26/16   Jade L Breeback, PA-C  naproxen (NAPROSYN) 500 MG tablet TAKE 1 TABLET TWICE A DAY WITH A MEAL 07/10/15   Monica Bectonhomas J Thekkekandam, MD  naproxen (NAPROSYN) 500 MG tablet TAKE 1 TABLET TWICE A DAY WITH A MEAL 04/08/16   Monica Bectonhomas J Thekkekandam, MD  omeprazole (PRILOSEC) 40 MG capsule TAKE 1 CAPSULE (40 MG TOTAL) BY MOUTH DAILY. 09/03/15   Jade L Breeback, PA-C  omeprazole (PRILOSEC) 40 MG capsule TAKE 1 CAPSULE (40 MG TOTAL)  BY MOUTH DAILY. 03/05/16   Jade L Breeback, PA-C  predniSONE (STERAPRED UNI-PAK 21 TAB) 10 MG (21) TBPK tablet 12 day taper pack, use as directed 11/26/15   Jade L Breeback, PA-C  promethazine-dextromethorphan (PROMETHAZINE-DM) 6.25-15 MG/5ML syrup Take 5 mLs by mouth 4 (four) times daily as needed for cough. 09/16/15   Rodolph BongEvan S Corey, MD  propranolol (INDERAL) 40 MG tablet Take 1 tablet (40 mg total) by mouth 2 (two) times daily. 01/14/16   Jade L Breeback, PA-C  sertraline (ZOLOFT) 25 MG tablet TAKE 1 TABLET (25 MG TOTAL) BY MOUTH DAILY. 03/16/16   Monica Bectonhomas J Thekkekandam, MD  sertraline (ZOLOFT) 25 MG tablet TAKE 1 TABLET EVERY DAY 04/27/16   Jade L Breeback, PA-C  sertraline (ZOLOFT) 50 MG tablet Take 1 tablet (50 mg total) by mouth daily. 02/17/15   Jade L Breeback, PA-C  sucralfate (CARAFATE) 1 G tablet TAKE 1 TABLET (1 G TOTAL) BY MOUTH 4 (FOUR) TIMES DAILY. 11/28/15   Jade L Breeback, PA-C  SUMAtriptan (IMITREX) 50 MG tablet TAKE 1 TABLET (50 MG TOTAL) BY MOUTH EVERY 2 (TWO) HOURS AS NEEDED. 11/24/15   Jade L Breeback, PA-C  ZOLMitriptan (ZOMIG) 2.5 MG tablet Take 1 tablet (2.5 mg total) by mouth as needed for migraine. 11/24/15   Jomarie LongsJade L Breeback, PA-C   Meds Ordered and Administered this Visit   Medications  ketorolac (TORADOL) injection 60 mg (60 mg Intramuscular Given 05/02/16 1616)    BP 135/85 mmHg  Pulse 78  Temp(Src) 98.7 F (37.1 C) (Oral)  Wt 186 lb (84.369 kg)  SpO2 98% No data found.   Physical Exam  Constitutional: She appears well-developed and well-nourished. No distress.  Pt sitting on exam table, appears mildly uncomfortable rubbing her Right side. Non-toxic appearing.   HENT:  Head: Normocephalic and atraumatic.  Mouth/Throat: Oropharynx is clear and moist.  Eyes: Conjunctivae are normal. No scleral icterus.  Neck: Normal range of motion.  Cardiovascular: Normal rate, regular rhythm and normal heart sounds.   Pulmonary/Chest: Effort normal and breath sounds normal. No  respiratory distress. She has no wheezes. She has no rales. She exhibits no tenderness.  Abdominal: Soft. Bowel sounds are normal. She exhibits no distension and no mass. There is tenderness. There is guarding. There is no rebound.  Soft, non-distended. Tenderness to RUQ with guarding.   Musculoskeletal: Normal range of motion.  Neurological: She is alert.  Skin: Skin is warm and dry. She is not diaphoretic.  Nursing note and vitals reviewed.   ED Course  Procedures (including critical care time)  Labs Review Labs Reviewed  COMPLETE METABOLIC PANEL WITH GFR  POCT CBC W AUTO DIFF ([facility])    Imaging Review [place] Abdomen Limited Ruq  05/02/2016  CLINICAL DATA:  50 year old female with right-sided abdominal pain for the  past 5-6 days. EXAM: US ABDOMEN LIMITED - RIGHT UPPER QUADRANT COMPARISON:  CT abdomen/ pelvis 07/16/2014 FINDINGS: Gallbladder: Mobile echogenic foci within the gallbladder lumen consistent with cholelithiasis. Per the sonographer, the sonographic Eulah Pont sign was positive. The gallbladder wall is normal in caliber at 2 mm. No pericholecystic fluid. Common bile duct: Diameter: Within normal limits at 2 mm Liver: Slightly echogenic hepatic parenchyma with mild coarsening of the echotexture. The main portal vein is patent with normal hepatopetal flow. No discrete lesions. IMPRESSION: 1. Cholelithiasis with a reportedly positive sonographic Murphy sign. However, there is no evidence of gallbladder wall thickening or pericholecystic fluid. Findings indeterminate for acute cholecystitis. If further imaging is clinically warranted, consider nuclear medicine HIDA scan. 2. Mild hepatic steatosis. Electronically Signed   By: Malachy Moan M.D.   On: 05/02/2016 17:38      MDM   1. RUQ abdominal pain    Pt c/o RUQ abdominal pain.  Tenderness to RUQ. No fever, n/v/d.  Concern for cholelithiasis vs cholecystitis due to tenderness on exam.  CBC: WNL CMP: unable to obtain  blood for test. Advised pt to return to [facility] lab tomorrow for blood draw.  Pt sent to [facility] for [place]: c/w cholelithiasis w/o evidence of gallbladder wall thickening or pericholecystic fluid.  Discussed results with pt over the phone.  Pt has tramadol at home. May take as needed for pain. Encouraged to f/u with her PCP this week as she may benefit from elective cholecystectomy.  Discussed symptoms that warrant emergent care in the ED. Patient verbalized understanding and agreement with treatment plan.      Junius Finner, PA-C 05/02/16 2033

## 2016-05-03 ENCOUNTER — Ambulatory Visit (INDEPENDENT_AMBULATORY_CARE_PROVIDER_SITE_OTHER): Payer: Federal, State, Local not specified - PPO | Admitting: Physician Assistant

## 2016-05-03 ENCOUNTER — Encounter: Payer: Self-pay | Admitting: Physician Assistant

## 2016-05-03 VITALS — BP 143/69 | HR 76 | Ht 64.0 in | Wt 186.0 lb

## 2016-05-03 DIAGNOSIS — K8020 Calculus of gallbladder without cholecystitis without obstruction: Secondary | ICD-10-CM | POA: Diagnosis not present

## 2016-05-03 DIAGNOSIS — R1011 Right upper quadrant pain: Secondary | ICD-10-CM | POA: Diagnosis not present

## 2016-05-03 LAB — COMPLETE METABOLIC PANEL WITH GFR
ALT: 12 U/L (ref 6–29)
AST: 13 U/L (ref 10–35)
Albumin: 4.3 g/dL (ref 3.6–5.1)
Alkaline Phosphatase: 65 U/L (ref 33–130)
BUN: 17 mg/dL (ref 7–25)
CO2: 28 mmol/L (ref 20–31)
Calcium: 9.4 mg/dL (ref 8.6–10.4)
Chloride: 103 mmol/L (ref 98–110)
Creat: 0.89 mg/dL (ref 0.50–1.05)
GFR, Est African American: 86 mL/min (ref >=60)
GFR, Est Non African American: 75 mL/min (ref >=60)
Glucose, Bld: 99 mg/dL (ref 65–99)
Potassium: 4.2 mmol/L (ref 3.5–5.3)
Sodium: 136 mmol/L (ref 135–146)
Total Bilirubin: 0.7 mg/dL (ref 0.2–1.2)
Total Protein: 6.8 g/dL (ref 6.1–8.1)

## 2016-05-03 MED ORDER — MONTELUKAST SODIUM 10 MG PO TABS: ORAL_TABLET | ORAL | Status: DC

## 2016-05-03 NOTE — Patient Instructions (Addendum)
Cholelithiasis Cholelithiasis (also called gallstones) is a form of gallbladder disease in which gallstones form in your gallbladder. The gallbladder is an organ that stores bile made in the liver, which helps digest fats. Gallstones begin as small crystals and slowly grow into stones. Gallstone pain occurs when the gallbladder spasms and a gallstone is blocking the duct. Pain can also occur when a stone passes out of the duct.  RISK FACTORS  Being female.   Having multiple pregnancies. Health care providers sometimes advise removing diseased gallbladders before future pregnancies.   Being obese.  Eating a diet heavy in fried foods and fat.   Being older than 60 years and increasing age.   Prolonged use of medicines containing female hormones.   Having diabetes mellitus.   Rapidly losing weight.   Having a family history of gallstones (heredity).  SYMPTOMS  Nausea.   Vomiting.  Abdominal pain.   Yellowing of the skin (jaundice).   Sudden pain. It may persist from several minutes to several hours.  Fever.   Tenderness to the touch. In some cases, when gallstones do not move into the bile duct, people have no pain or symptoms. These are called "silent" gallstones.  TREATMENT Silent gallstones do not need treatment. In severe cases, emergency surgery may be required. Options for treatment include:  Surgery to remove the gallbladder. This is the most common treatment.  Medicines. These do not always work and may take 6-12 months or more to work. HOME CARE INSTRUCTIONS   Only take over-the-counter or prescription medicines for pain, discomfort, or fever as directed by your health care provider.   Follow a low-fat diet until seen again by your health care provider. Fat causes the gallbladder to contract, which can result in pain.   Follow up with your health care provider as directed. Attacks are almost always recurrent and surgery is usually required for  permanent treatment.  SEEK IMMEDIATE MEDICAL CARE IF:   Your pain increases and is not controlled by medicines.   You have a fever or persistent symptoms for more than 2-3 days.   You have a fever and your symptoms suddenly get worse.   You have persistent nausea and vomiting.  MAKE SURE YOU:   Understand these instructions.  Will watch your condition.  Will get help right away if you are not doing well or get worse.   This information is not intended to replace advice given to you by your health care provider. Make sure you discuss any questions you have with your health care provider.   Document Released: 11/25/2005 Document Revised: 08/01/2013 Document Reviewed: 05/23/2013 Elsevier Interactive Patient Education [employer]2016 Elsevier Inc.

## 2016-05-03 NOTE — Addendum Note (Signed)
Addended by: Jomarie LongsBREEBACK, Joelys Staubs L on: 05/03/2016 05:15 PM   Modules accepted: Orders

## 2016-05-03 NOTE — Progress Notes (Signed)
   Subjective:    Patient ID: Angela Oliver, female    DOB: 06/24/1966, 50 y.o.   MRN: [id number]  HPI Pt presents to the clinic after UC visit on 05/02/16 and found to have gallstones without obstruction or signs of infection. Pt denies any n/v/d, fever, chills. She has constant RUQ pain with worsening after eating. Rates pain at worst 7/10. CMP and CBC within normal limits. Ate a sub sandwich today and was in strong, sharp pain but relieved by bowel movement.   Review of Systems See HPI.    Objective:   Physical Exam  Constitutional: She is oriented to person, place, and time. She appears well-developed and well-nourished.  HENT:  Head: Normocephalic and atraumatic.  Cardiovascular: Normal rate, regular rhythm and normal heart sounds.   Pulmonary/Chest: Effort normal and breath sounds normal.  Abdominal: She exhibits no distension and no mass. There is tenderness. There is no rebound and no guarding.  Positive murphys sign.   Neurological: She is alert and oriented to person, place, and time.  Psychiatric: She has a normal mood and affect. Her behavior is normal.          Assessment & Plan:  gallstones without obstruction- will refer to general surgery. Discussed oral medications can take 6-12 months to work. Continue on PPI. Pt has anti-nausea medication at home to use as needed.

## 2016-05-04 ENCOUNTER — Ambulatory Visit: Payer: Federal, State, Local not specified - PPO | Admitting: Physician Assistant

## 2016-05-06 NOTE — ED Notes (Signed)
Note created to add order for CBC done in-house on 05/02/16

## 2016-05-26 ENCOUNTER — Ambulatory Visit: Payer: Self-pay | Admitting: General Surgery

## 2016-05-26 DIAGNOSIS — K8020 Calculus of gallbladder without cholecystitis without obstruction: Secondary | ICD-10-CM | POA: Diagnosis not present

## 2016-05-26 NOTE — H&P (Signed)
Angela Oliver 05/26/2016 11:01 AM Location: [facility] Patient #: [id number] DOB: 12/03/1966 Single / Language: [place] / Race: White Female  History of Present Illness Angela Oliver(Angela Fulmore J. Shalina Norfolk MD; 05/26/2016 11:31 AM) Patient words: gallbladder.  The patient is a 50 year old female.   Note:She is referred by Angela GawJade Breeback, PA-C for consultation regarding symptomatic cholelithiasis. She ended up in the emergency department 21 May with a severe case of biliary colic. Liver function tests were normal. White blood cell count was normal. Ultrasound demonstrated multiple gallstones with no evidence of gallbladder wall thickening or pericholecystic fluid. Common bile duct diameter was within normal limits. She's had smaller episodes since then which typically follow a fatty meal. No family history of gallbladder disease.  Other Problems Angela Oliver(Angela Oliver, CMA; 05/26/2016 11:44 AM) Anxiety Disorder Arthritis Asthma Back Pain Cholelithiasis Gastroesophageal Reflux Disease Hemorrhoids Migraine Headache  Past Surgical History Angela Oliver(Angela Oliver, CMA; 05/26/2016 11:44 AM) Appendectomy Foot Surgery Bilateral. Tonsillectomy  Diagnostic Studies History Angela Oliver(Angela Oliver, CMA; 05/26/2016 11:44 AM) Mammogram 1-3 years ago Pap Smear never  Allergies Angela Oliver(Angela Oliver, CMA; 05/26/2016 11:10 AM) Sulfabenzamide *CHEMICALS* SUMAtriptan *MIGRAINE PRODUCTS* Nausea. Topiramate *ANTICONVULSANTS*  Medication History Angela Oliver(Angela Oliver, CMA; 05/26/2016 11:10 AM) ZyrTEC (10MG  Tablet, Oral daily) Active. Singulair (10MG  Tablet, Oral daily) Active. Flexeril (10MG  Tablet, 1-2 Oral qhs) Active. Inderal (40MG  Tablet, Oral bid) Active. Zomig (2.5MG  Tablet, Oral as needed) Active. Carafate (1GM Tablet, Oral qid) Active. Albuterol (90MCG/ACT Aerosol Soln, 2 puffs Inhalation every four hours) Active. PriLOSEC (40MG  Capsule DR, Oral as needed) Active. Mobic (15MG  Tablet, 1-2 Oral daily)  Active. Acyclovir (400MG  Tablet, Oral bid) Active. Beclomethasone Diprop (Nasal) (42MCG/ACT Aerosol Soln, 2 puffs Nasal bid) Active. Zoloft (50MG  Tablet, Oral daily) Active. Acyclovir (5% Ointment, External every three hours prn) Active. Medications Reconciled  Social History Angela Oliver(Angela Oliver, CMA; 05/26/2016 11:44 AM) Alcohol use Occasional alcohol use. Caffeine use Coffee. No drug use Tobacco use Never smoker.  Family History Angela Oliver(Angela Oliver, [place]; 05/26/2016 11:44 AM) Alcohol Abuse Father. Arthritis Father, Mother. Breast Cancer Mother. Diabetes Mellitus Father. Migraine Headache Father.  Pregnancy / Birth History Angela Oliver(Angela Oliver, CMA; 05/26/2016 11:44 AM) Age of menopause 51-55 Gravida 0 Irregular periods Para 0     Review of Systems Angela Oliver(Angela Oliver CMA; 05/26/2016 11:44 AM) General Present- Appetite Loss, Fatigue and Weight Gain. Not Present- Chills, Fever, Night Sweats and Weight Loss. Skin Present- Dryness. Not Present- Change in Wart/Mole, Hives, Jaundice, New Lesions, Non-Healing Wounds, Rash and Ulcer. HEENT Present- Seasonal Allergies. Not Present- Earache, Hearing Loss, Hoarseness, Nose Bleed, Oral Ulcers, Ringing in the Ears, Sinus Pain, Sore Throat, Visual Disturbances, Wears glasses/contact lenses and Yellow Eyes. Respiratory Present- Difficulty Breathing. Not Present- Bloody sputum, Chronic Cough, Snoring and Wheezing. Cardiovascular Present- Shortness of Breath and Swelling of Extremities. Not Present- Chest Pain, Difficulty Breathing Lying Down, Leg Cramps, Palpitations and Rapid Heart Rate. Gastrointestinal Present- Abdominal Pain, Bloating, Change in Bowel Habits and Constipation. Not Present- Bloody Stool, Chronic diarrhea, Difficulty Swallowing, Excessive gas, Gets full quickly at meals, Hemorrhoids, Indigestion, Nausea, Rectal Pain and Vomiting. Female Genitourinary Not Present- Frequency, Nocturia, Painful Urination, Pelvic Pain and  Urgency. Musculoskeletal Present- Back Pain, Joint Pain and Joint Stiffness. Not Present- Muscle Pain, Muscle Weakness and Swelling of Extremities. Neurological Present- Headaches and Tingling. Not Present- Decreased Memory, Fainting, Numbness, Seizures, Tremor, Trouble walking and Weakness. Psychiatric Present- Anxiety. Not Present- Bipolar, Change in Sleep Pattern, Depression, Fearful and Frequent crying. Endocrine Present- Heat Intolerance and Hot flashes. Not Present- Cold Intolerance, Excessive Hunger, Hair  Changes and New Diabetes. Hematology Not Present- Easy Bruising, Excessive bleeding, Gland problems, HIV and Persistent Infections.  Vitals Angela Oliver Oliver CMA; 05/26/2016 11:01 AM) 05/26/2016 11:01 AM Weight: 185.8 lb Height: 64in Body Surface Area: 1.9 m Body Mass Index: 31.89 kg/m  Temp.: 98.36F(Oral)  Pulse: 80 (Regular)  BP: 120/80 (Sitting, Left Arm, Standard)      Physical Exam Angela Pollack MD; 05/26/2016 11:32 AM)  The physical exam findings are as follows: Note:General: Overweight female in NAD. Pleasant and cooperative.  HEENT: [place]/AT, no facial masses  EYES: EOMI, no icterus  NECK: Supple, no obvious mass or thyroid enlargement.  CV: RRR, no murmur, no JVD.  CHEST: Breath sounds equal and clear. Respirations nonlabored.   ABDOMEN: Soft, mild RUQ tenderness, nondistended, no masses, no organomegaly, RLQ scar  MUSCULOSKELETAL: FROM, good muscle tone, no edema, no venous stasis changes  SKIN: No jaundice.  NEUROLOGIC: Alert and oriented, answers questions appropriately.  PSYCHIATRIC: Normal mood, affect , and behavior.    Assessment & Plan Angela Pollack MD; 05/26/2016 11:33 AM)  SYMPTOMATIC CHOLELITHIASIS (K80.20) Impression: Frequency of symptoms is increasing and is related to a fatty meal. I recommended laparoscopic cholecystectomy and she is in agreement with this.  Plan: Strict low fat diet. Schedule laparoscopic  cholecystectomy with cholangiogram. If she has a severe episode of pain with vomiting and fever, I instructed her to go to the emergency room. I have explained the procedure, risks, and aftercare of cholecystectomy. Risks include but are not limited to bleeding, infection, wound problems, anesthesia, diarrhea, bile leak, injury to common bile duct/liver/intestine. She seems to understand and agrees to proceed.  Avel Peace, MD

## 2016-05-27 ENCOUNTER — Other Ambulatory Visit: Payer: Self-pay | Admitting: Physician Assistant

## 2016-05-27 NOTE — Pre-Procedure Instructions (Addendum)
    Angela Oliver  05/27/2016     Your procedure is scheduled on Tuesday June 20.  [facility]: 15 [facility].M.                  Your surgery or procedure is scheduled for 9:15 AM   Call this number if you have problems the morning of surgery:[phone]   Remember:  Do not eat food or drink liquids after midnight Monday, June 19.  Take these medicines the morning of surgery with A SIP OF WATER: acyclovir (zovirax),  cetirizine (zyrtec), montelukast (singular), omeprazole (prilosec), propranolol, sertraline (zoloft),sucralfate (CARAFATE).  May use Inhalers.               May take Imitrax, Acyclovir, Zomig ,  if needed.                     STOP taking: butalbital-aspirin-caffeine (FIORINAL), Meloxicam (Mobic).  Do NOT take any Aspirin Products, Ibuprofen (Advil), Naproxen (Aleve), Vitamins or Herbal Products.   Do not wear jewelry, make-up or nail polish.  Do not wear lotions, powders, or perfumes.    Do not shave 48 hours prior to surgery.    Do not bring valuables to the hospital.  [facility] is not responsible for any belongings or valuables.  Contacts, dentures or bridgework may not be worn into surgery.  Leave your suitcase in the car.  After surgery it may be brought to your room.  For patients admitted to the hospital, discharge time will be determined by your treatment team.  Patients discharged the day of surgery will not be allowed to drive  Home.     Please read over the following fact sheets that you were given. [place] - Preparing for Surgery.

## 2016-05-28 ENCOUNTER — Other Ambulatory Visit: Payer: Self-pay

## 2016-05-28 ENCOUNTER — Encounter (HOSPITAL_COMMUNITY): Payer: Self-pay

## 2016-05-28 ENCOUNTER — Encounter (HOSPITAL_COMMUNITY)
Admission: RE | Admit: 2016-05-28 | Discharge: 2016-05-28 | Disposition: A | Discharge status: Home / Self Care | Payer: Federal, State, Local not specified - PPO | Source: Ambulatory Visit | Attending: General Surgery | Admitting: General Surgery

## 2016-05-28 DIAGNOSIS — Z01812 Encounter for preprocedural laboratory examination: Secondary | ICD-10-CM | POA: Diagnosis not present

## 2016-05-28 DIAGNOSIS — J45909 Unspecified asthma, uncomplicated: Secondary | ICD-10-CM | POA: Diagnosis not present

## 2016-05-28 DIAGNOSIS — F419 Anxiety disorder, unspecified: Secondary | ICD-10-CM | POA: Insufficient documentation

## 2016-05-28 DIAGNOSIS — K219 Gastro-esophageal reflux disease without esophagitis: Secondary | ICD-10-CM | POA: Diagnosis not present

## 2016-05-28 DIAGNOSIS — K8020 Calculus of gallbladder without cholecystitis without obstruction: Secondary | ICD-10-CM | POA: Insufficient documentation

## 2016-05-28 DIAGNOSIS — Z01818 Encounter for other preprocedural examination: Secondary | ICD-10-CM | POA: Diagnosis not present

## 2016-05-28 DIAGNOSIS — G43909 Migraine, unspecified, not intractable, without status migrainosus: Secondary | ICD-10-CM | POA: Insufficient documentation

## 2016-05-28 DIAGNOSIS — Z79899 Other long term (current) drug therapy: Secondary | ICD-10-CM | POA: Diagnosis not present

## 2016-05-28 HISTORY — DX: Cardiac arrhythmia, unspecified: I49.9

## 2016-05-28 HISTORY — DX: Unspecified osteoarthritis, unspecified site: M19.90

## 2016-05-28 HISTORY — DX: Family history of other specified conditions: Z84.89

## 2016-05-28 HISTORY — DX: Anxiety disorder, unspecified: F41.9

## 2016-05-28 HISTORY — DX: Other complications of anesthesia, initial encounter: T88.59XA

## 2016-05-28 HISTORY — DX: Adverse effect of unspecified anesthetic, initial encounter: T41.45XA

## 2016-05-28 HISTORY — DX: Gastro-esophageal reflux disease without esophagitis: K21.9

## 2016-05-28 LAB — CBC
HCT: 40.3 % (ref 36.0–46.0)
HEMOGLOBIN: 13.5 g/dL (ref 12.0–15.0)
MCH: 29.5 pg (ref 26.0–34.0)
MCHC: 33.5 g/dL (ref 30.0–36.0)
MCV: 88 fL (ref 78.0–100.0)
Platelets: 235 10*3/uL (ref 150–400)
RBC: 4.58 MIL/uL (ref 3.87–5.11)
RDW: 12.4 % (ref 11.5–15.5)
WBC: 6.6 10*3/uL (ref 4.0–10.5)

## 2016-05-28 LAB — BASIC METABOLIC PANEL
ANION GAP: 5 (ref 5–15)
BUN: 14 mg/dL (ref 6–20)
CALCIUM: 9 mg/dL (ref 8.9–10.3)
CO2: 24 mmol/L (ref 22–32)
CREATININE: 0.84 mg/dL (ref 0.44–1.00)
Chloride: 108 mmol/L (ref 101–111)
GFR calc non Af Amer: >60 mL/min (ref >60)
Glucose, Bld: 96 mg/dL (ref 65–99)
Potassium: 4.2 mmol/L (ref 3.5–5.1)
SODIUM: 137 mmol/L (ref 135–145)

## 2016-05-28 NOTE — Progress Notes (Addendum)
Ms Angela Oliver denies chest pains or shortness of breath.  Patient does not see a cardiologist and has not had cardiac studies.  Patient reports that "heart skipped a beat in the past", no palpations.   PCP is FergusonBreedbach, [place]- [facility]

## 2016-05-31 ENCOUNTER — Other Ambulatory Visit: Payer: Self-pay

## 2016-05-31 MED ORDER — BECLOMETHASONE DIPROPIONATE 80 MCG/ACT IN AERS: 2.0000 | INHALATION_SPRAY | Freq: Two times a day (BID) | RESPIRATORY_TRACT | Status: DC

## 2016-05-31 MED ORDER — CEFAZOLIN SODIUM-DEXTROSE 2-4 GM/100ML-% IV SOLN
2.0000 g | INTRAVENOUS | Status: AC
  Administered 2016-06-01: 2 g via INTRAVENOUS
  Filled 2016-05-31: qty 100

## 2016-05-31 MED ORDER — SUCRALFATE 1 G PO TABS: ORAL_TABLET | ORAL | Status: DC

## 2016-05-31 NOTE — Progress Notes (Signed)
Anesthesia Chart Review: Patient is a 50 year old female scheduled for laparoscopic cholecystectomy on 06/01/16 by Dr. Abbey Chattersosenbower.  History includes non-smoker, asthma, migraines (on propranolol), anxiety, GERD, arthritis, appendectomy, occasional "skipped" beat (denied palpitations), HSV. BMI is consistent with mild obesity. PCP is Tandy GawJade Breeback, PA-C.   Meds include acyclovir, ProAir, Qvar, Excedrin Migraine, Zyrtec, Flexeril, Singulair, Prilosec, propranolol, Zoloft, Carafate, Imitrex, Zomig.  05/28/16 EKG: SB at 59 bpm, septal infarct (age undetermined). No old tracing to compare. She denied chest pain and SOB at her PAT RN visit.   09/16/15 CXR: IMPRESSION: No active cardiopulmonary disease.  Preoperative labs noted.   Further evaluation by her anesthesiologist on the day of surgery, but if she remains asymptomatic from a CV standpoint then I would anticipate that she could proceed as planned. Of note, PAT RN writes that patient has a permanent top front bridge and 3 teeth that are loose. Further evaluation of that with dental advisory will need to be done on the day of surgery.   Velna Ochsllison Taysen Bushart, PA-C [facility]Anesthesiology Phone [phone] 05/31/2016 10:43 AM

## 2016-06-01 ENCOUNTER — Encounter (HOSPITAL_COMMUNITY)
Admission: RE | Disposition: A | Discharge status: Home / Self Care | Payer: Self-pay | Source: Ambulatory Visit | Attending: General Surgery

## 2016-06-01 ENCOUNTER — Ambulatory Visit (HOSPITAL_COMMUNITY): Payer: Federal, State, Local not specified - PPO

## 2016-06-01 ENCOUNTER — Ambulatory Visit (HOSPITAL_COMMUNITY): Payer: Federal, State, Local not specified - PPO | Admitting: Vascular Surgery

## 2016-06-01 ENCOUNTER — Ambulatory Visit (HOSPITAL_COMMUNITY): Payer: Federal, State, Local not specified - PPO | Admitting: Anesthesiology

## 2016-06-01 ENCOUNTER — Ambulatory Visit (HOSPITAL_COMMUNITY)
Admission: RE | Admit: 2016-06-01 | Discharge: 2016-06-02 | Disposition: A | Discharge status: Home / Self Care | Payer: Federal, State, Local not specified - PPO | Source: Ambulatory Visit | Attending: General Surgery | Admitting: General Surgery

## 2016-06-01 DIAGNOSIS — M1990 Unspecified osteoarthritis, unspecified site: Secondary | ICD-10-CM | POA: Insufficient documentation

## 2016-06-01 DIAGNOSIS — G43909 Migraine, unspecified, not intractable, without status migrainosus: Secondary | ICD-10-CM | POA: Diagnosis present

## 2016-06-01 DIAGNOSIS — K8020 Calculus of gallbladder without cholecystitis without obstruction: Secondary | ICD-10-CM | POA: Diagnosis present

## 2016-06-01 DIAGNOSIS — K8000 Calculus of gallbladder with acute cholecystitis without obstruction: Secondary | ICD-10-CM | POA: Diagnosis not present

## 2016-06-01 DIAGNOSIS — J45909 Unspecified asthma, uncomplicated: Secondary | ICD-10-CM | POA: Diagnosis not present

## 2016-06-01 DIAGNOSIS — Z791 Long term (current) use of non-steroidal anti-inflammatories (NSAID): Secondary | ICD-10-CM | POA: Diagnosis not present

## 2016-06-01 DIAGNOSIS — K55049 Acute infarction of large intestine, extent unspecified: Secondary | ICD-10-CM | POA: Diagnosis not present

## 2016-06-01 DIAGNOSIS — K8010 Calculus of gallbladder with chronic cholecystitis without obstruction: Secondary | ICD-10-CM | POA: Insufficient documentation

## 2016-06-01 DIAGNOSIS — K219 Gastro-esophageal reflux disease without esophagitis: Secondary | ICD-10-CM | POA: Diagnosis not present

## 2016-06-01 DIAGNOSIS — Z79899 Other long term (current) drug therapy: Secondary | ICD-10-CM | POA: Diagnosis not present

## 2016-06-01 DIAGNOSIS — K668 Other specified disorders of peritoneum: Secondary | ICD-10-CM | POA: Insufficient documentation

## 2016-06-01 HISTORY — PX: CHOLECYSTECTOMY: SHX55

## 2016-06-01 LAB — PREGNANCY, URINE: PREG TEST UR: NEGATIVE

## 2016-06-01 SURGERY — LAPAROSCOPIC CHOLECYSTECTOMY WITH INTRAOPERATIVE CHOLANGIOGRAM
Anesthesia: General | Site: Abdomen

## 2016-06-01 MED ORDER — SUGAMMADEX SODIUM 200 MG/2ML IV SOLN
INTRAVENOUS | Status: DC | PRN
  Administered 2016-06-01: 200 mg via INTRAVENOUS

## 2016-06-01 MED ORDER — LACTATED RINGERS IV SOLN: INTRAVENOUS | Status: DC

## 2016-06-01 MED ORDER — SODIUM CHLORIDE 0.9% FLUSH: 3.0000 mL | INTRAVENOUS | Status: DC | PRN

## 2016-06-01 MED ORDER — SUMATRIPTAN SUCCINATE 50 MG PO TABS
50.0000 mg | ORAL_TABLET | ORAL | Status: DC | PRN
  Administered 2016-06-02: 50 mg via ORAL
  Filled 2016-06-01: qty 1

## 2016-06-01 MED ORDER — SODIUM CHLORIDE 0.9 % IV SOLN
INTRAVENOUS | Status: DC | PRN
  Administered 2016-06-01: 25 mL

## 2016-06-01 MED ORDER — PHENYLEPHRINE HCL 10 MG/ML IJ SOLN
INTRAMUSCULAR | Status: DC | PRN
  Administered 2016-06-01: 80 ug via INTRAVENOUS

## 2016-06-01 MED ORDER — CHLORHEXIDINE GLUCONATE CLOTH 2 % EX PADS: 6.0000 | MEDICATED_PAD | Freq: Once | CUTANEOUS | Status: DC

## 2016-06-01 MED ORDER — 0.9 % SODIUM CHLORIDE (POUR BTL) OPTIME
TOPICAL | Status: DC | PRN
  Administered 2016-06-01: 1000 mL

## 2016-06-01 MED ORDER — HYDROMORPHONE HCL 1 MG/ML IJ SOLN
INTRAMUSCULAR | Status: AC
  Administered 2016-06-01: 0.5 mg via INTRAVENOUS
  Filled 2016-06-01: qty 1

## 2016-06-01 MED ORDER — GLYCOPYRROLATE 0.2 MG/ML IV SOSY
PREFILLED_SYRINGE | INTRAVENOUS | Status: AC
  Filled 2016-06-01: qty 3

## 2016-06-01 MED ORDER — ONDANSETRON 4 MG PO TBDP: 4.0000 mg | ORAL_TABLET | Freq: Four times a day (QID) | ORAL | Status: DC | PRN

## 2016-06-01 MED ORDER — MONTELUKAST SODIUM 10 MG PO TABS
10.0000 mg | ORAL_TABLET | Freq: Every day | ORAL | Status: DC
  Administered 2016-06-02: 10 mg via ORAL
  Filled 2016-06-01 (×3): qty 1

## 2016-06-01 MED ORDER — ACETAMINOPHEN 325 MG PO TABS: 650.0000 mg | ORAL_TABLET | ORAL | Status: DC | PRN

## 2016-06-01 MED ORDER — ONDANSETRON HCL 4 MG/2ML IJ SOLN
INTRAMUSCULAR | Status: DC | PRN
  Administered 2016-06-01: 4 mg via INTRAVENOUS

## 2016-06-01 MED ORDER — EPHEDRINE 5 MG/ML INJ
INTRAVENOUS | Status: AC
  Filled 2016-06-01: qty 10

## 2016-06-01 MED ORDER — DEXTROSE IN LACTATED RINGERS 5 % IV SOLN: INTRAVENOUS | Status: DC

## 2016-06-01 MED ORDER — LIDOCAINE HCL (PF) 2 % IJ SOLN
INTRAMUSCULAR | Status: DC | PRN
  Administered 2016-06-01: 75 mg via INTRADERMAL

## 2016-06-01 MED ORDER — MEPERIDINE HCL 25 MG/ML IJ SOLN: 6.2500 mg | INTRAMUSCULAR | Status: DC | PRN

## 2016-06-01 MED ORDER — OXYCODONE HCL 5 MG PO TABS
5.0000 mg | ORAL_TABLET | ORAL | Status: DC | PRN
  Administered 2016-06-01: 10 mg via ORAL
  Filled 2016-06-01: qty 2

## 2016-06-01 MED ORDER — OXYCODONE HCL 5 MG PO TABS: 5.0000 mg | ORAL_TABLET | ORAL | Status: DC | PRN

## 2016-06-01 MED ORDER — ACETAMINOPHEN 650 MG RE SUPP: 650.0000 mg | RECTAL | Status: DC | PRN

## 2016-06-01 MED ORDER — DEXTROSE IN LACTATED RINGERS 5 % IV SOLN
INTRAVENOUS | Status: DC
  Administered 2016-06-01: 14:00:00 via INTRAVENOUS

## 2016-06-01 MED ORDER — FENTANYL CITRATE (PF) 250 MCG/5ML IJ SOLN
INTRAMUSCULAR | Status: AC
  Filled 2016-06-01: qty 5

## 2016-06-01 MED ORDER — HYDROMORPHONE HCL 1 MG/ML IJ SOLN
0.2500 mg | INTRAMUSCULAR | Status: DC | PRN
  Administered 2016-06-01 (×2): 0.5 mg via INTRAVENOUS

## 2016-06-01 MED ORDER — IOPAMIDOL (ISOVUE-300) INJECTION 61%
INTRAVENOUS | Status: AC
  Filled 2016-06-01: qty 50

## 2016-06-01 MED ORDER — ONDANSETRON HCL 4 MG PO TABS: 4.0000 mg | ORAL_TABLET | ORAL | Status: DC | PRN

## 2016-06-01 MED ORDER — EPHEDRINE SULFATE 50 MG/ML IJ SOLN
INTRAMUSCULAR | Status: DC | PRN
  Administered 2016-06-01 (×2): 10 mg via INTRAVENOUS

## 2016-06-01 MED ORDER — LIDOCAINE 2% (20 MG/ML) 5 ML SYRINGE
INTRAMUSCULAR | Status: AC
  Filled 2016-06-01: qty 5

## 2016-06-01 MED ORDER — MIDAZOLAM HCL 2 MG/2ML IJ SOLN
INTRAMUSCULAR | Status: AC
  Filled 2016-06-01: qty 2

## 2016-06-01 MED ORDER — ROCURONIUM BROMIDE 100 MG/10ML IV SOLN
INTRAVENOUS | Status: DC | PRN
  Administered 2016-06-01: 60 mg via INTRAVENOUS

## 2016-06-01 MED ORDER — BUPIVACAINE-EPINEPHRINE (PF) 0.25% -1:200000 IJ SOLN
INTRAMUSCULAR | Status: AC
  Filled 2016-06-01: qty 30

## 2016-06-01 MED ORDER — PROMETHAZINE HCL 25 MG/ML IJ SOLN: 6.2500 mg | INTRAMUSCULAR | Status: DC | PRN

## 2016-06-01 MED ORDER — DEXAMETHASONE SODIUM PHOSPHATE 10 MG/ML IJ SOLN
INTRAMUSCULAR | Status: AC
  Filled 2016-06-01: qty 1

## 2016-06-01 MED ORDER — MORPHINE SULFATE (PF) 2 MG/ML IV SOLN
2.0000 mg | INTRAVENOUS | Status: DC | PRN
  Administered 2016-06-01: 2 mg via INTRAVENOUS
  Filled 2016-06-01: qty 1

## 2016-06-01 MED ORDER — FENTANYL CITRATE (PF) 100 MCG/2ML IJ SOLN
INTRAMUSCULAR | Status: DC | PRN
  Administered 2016-06-01: 50 ug via INTRAVENOUS
  Administered 2016-06-01 (×2): 100 ug via INTRAVENOUS

## 2016-06-01 MED ORDER — PROPOFOL 10 MG/ML IV BOLUS
INTRAVENOUS | Status: DC | PRN
  Administered 2016-06-01: 150 mg via INTRAVENOUS

## 2016-06-01 MED ORDER — ONDANSETRON HCL 4 MG/2ML IJ SOLN
4.0000 mg | INTRAMUSCULAR | Status: DC | PRN
  Administered 2016-06-01: 4 mg via INTRAVENOUS
  Filled 2016-06-01: qty 2

## 2016-06-01 MED ORDER — GLYCOPYRROLATE 0.2 MG/ML IJ SOLN
INTRAMUSCULAR | Status: DC | PRN
  Administered 2016-06-01: 0.4 mg via INTRAVENOUS

## 2016-06-01 MED ORDER — DEXAMETHASONE SODIUM PHOSPHATE 10 MG/ML IJ SOLN
INTRAMUSCULAR | Status: DC | PRN
  Administered 2016-06-01: 10 mg via INTRAVENOUS

## 2016-06-01 MED ORDER — PROPOFOL 10 MG/ML IV BOLUS
INTRAVENOUS | Status: AC
  Filled 2016-06-01: qty 20

## 2016-06-01 MED ORDER — ROCURONIUM BROMIDE 50 MG/5ML IV SOLN
INTRAVENOUS | Status: AC
  Filled 2016-06-01: qty 2

## 2016-06-01 MED ORDER — ENOXAPARIN SODIUM 40 MG/0.4ML ~~LOC~~ SOLN
40.0000 mg | SUBCUTANEOUS | Status: DC
  Administered 2016-06-02: 40 mg via SUBCUTANEOUS
  Filled 2016-06-01: qty 0.4

## 2016-06-01 MED ORDER — SUGAMMADEX SODIUM 200 MG/2ML IV SOLN
INTRAVENOUS | Status: AC
  Filled 2016-06-01: qty 2

## 2016-06-01 MED ORDER — PROPRANOLOL HCL 10 MG PO TABS
20.0000 mg | ORAL_TABLET | Freq: Two times a day (BID) | ORAL | Status: DC
  Administered 2016-06-01 – 2016-06-02 (×2): 20 mg via ORAL
  Filled 2016-06-01 (×3): qty 2

## 2016-06-01 MED ORDER — PHENYLEPHRINE 40 MCG/ML (10ML) SYRINGE FOR IV PUSH (FOR BLOOD PRESSURE SUPPORT)
PREFILLED_SYRINGE | INTRAVENOUS | Status: AC
  Filled 2016-06-01: qty 10

## 2016-06-01 MED ORDER — BUDESONIDE 0.25 MG/2ML IN SUSP
0.2500 mg | Freq: Two times a day (BID) | RESPIRATORY_TRACT | Status: DC
  Filled 2016-06-01: qty 2

## 2016-06-01 MED ORDER — MORPHINE SULFATE (PF) 2 MG/ML IV SOLN: 2.0000 mg | INTRAVENOUS | Status: DC | PRN

## 2016-06-01 MED ORDER — BUPIVACAINE-EPINEPHRINE 0.25% -1:200000 IJ SOLN
INTRAMUSCULAR | Status: DC | PRN
  Administered 2016-06-01: 14 mL

## 2016-06-01 MED ORDER — LACTATED RINGERS IV SOLN
INTRAVENOUS | Status: DC
  Administered 2016-06-01 (×2): via INTRAVENOUS

## 2016-06-01 MED ORDER — ONDANSETRON HCL 4 MG/2ML IJ SOLN
INTRAMUSCULAR | Status: AC
  Filled 2016-06-01: qty 2

## 2016-06-01 MED ORDER — ALBUTEROL SULFATE 108 (90 BASE) MCG/ACT IN AEPB: 2.0000 | INHALATION_SPRAY | RESPIRATORY_TRACT | Status: DC | PRN

## 2016-06-01 MED ORDER — SERTRALINE HCL 50 MG PO TABS
50.0000 mg | ORAL_TABLET | Freq: Every day | ORAL | Status: DC
  Administered 2016-06-02: 50 mg via ORAL
  Filled 2016-06-01 (×3): qty 1

## 2016-06-01 MED ORDER — MIDAZOLAM HCL 5 MG/5ML IJ SOLN
INTRAMUSCULAR | Status: DC | PRN
  Administered 2016-06-01: 2 mg via INTRAVENOUS

## 2016-06-01 MED ORDER — SODIUM CHLORIDE 0.9 % IR SOLN
Status: DC | PRN
  Administered 2016-06-01 (×2): 1

## 2016-06-01 SURGICAL SUPPLY — 49 items
APPLIER CLIP 5 13 M/L LIGAMAX5 (MISCELLANEOUS) ×2
BENZOIN TINCTURE PRP APPL 2/3 (GAUZE/BANDAGES/DRESSINGS) ×2 IMPLANT
CANISTER SUCTION 2500CC (MISCELLANEOUS) ×2 IMPLANT
CATH REDDICK CHOLANGI 4FR 50CM (CATHETERS) ×2 IMPLANT
CHLORAPREP W/TINT 26ML (MISCELLANEOUS) ×2 IMPLANT
CLIP APPLIE 5 13 M/L LIGAMAX5 (MISCELLANEOUS) ×1 IMPLANT
COVER MAYO STAND STRL (DRAPES) ×2 IMPLANT
COVER SURGICAL LIGHT HANDLE (MISCELLANEOUS) ×2 IMPLANT
DECANTER SPIKE VIAL GLASS SM (MISCELLANEOUS) ×2 IMPLANT
DRAPE C-ARM 42X72 X-RAY (DRAPES) ×2 IMPLANT
DRSG TEGADERM 2-3/8X2-3/4 SM (GAUZE/BANDAGES/DRESSINGS) ×8 IMPLANT
ELECT REM PT RETURN 9FT ADLT (ELECTROSURGICAL) ×2
ELECTRODE REM PT RTRN 9FT ADLT (ELECTROSURGICAL) ×1 IMPLANT
GAUZE SPONGE 2X2 8PLY STRL LF (GAUZE/BANDAGES/DRESSINGS) ×1 IMPLANT
GLOVE BIO SURGEON STRL SZ8 (GLOVE) ×4 IMPLANT
GLOVE BIOGEL PI IND STRL 7.0 (GLOVE) ×1 IMPLANT
GLOVE BIOGEL PI IND STRL 8 (GLOVE) ×2 IMPLANT
GLOVE BIOGEL PI IND STRL 8.5 (GLOVE) ×1 IMPLANT
GLOVE BIOGEL PI INDICATOR 7.0 (GLOVE) ×1
GLOVE BIOGEL PI INDICATOR 8 (GLOVE) ×2
GLOVE BIOGEL PI INDICATOR 8.5 (GLOVE) ×1
GLOVE ECLIPSE 8.0 STRL XLNG CF (GLOVE) ×2 IMPLANT
GLOVE SURG SS PI 7.0 STRL IVOR (GLOVE) ×2 IMPLANT
GOWN STRL REUS W/ TWL LRG LVL3 (GOWN DISPOSABLE) ×2 IMPLANT
GOWN STRL REUS W/ TWL XL LVL3 (GOWN DISPOSABLE) ×1 IMPLANT
GOWN STRL REUS W/TWL LRG LVL3 (GOWN DISPOSABLE) ×2
GOWN STRL REUS W/TWL XL LVL3 (GOWN DISPOSABLE) ×1
IV CATH 14GX2 1/4 (CATHETERS) ×2 IMPLANT
KIT BASIN OR (CUSTOM PROCEDURE TRAY) ×2 IMPLANT
KIT ROOM TURNOVER OR (KITS) ×2 IMPLANT
NS IRRIG 1000ML POUR BTL (IV SOLUTION) ×2 IMPLANT
PAD ARMBOARD 7.5X6 YLW CONV (MISCELLANEOUS) ×2 IMPLANT
POUCH RETRIEVAL ECOSAC 10 (ENDOMECHANICALS) ×1 IMPLANT
POUCH RETRIEVAL ECOSAC 10MM (ENDOMECHANICALS) ×1
POUCH SPECIMEN RETRIEVAL 10MM (ENDOMECHANICALS) ×2 IMPLANT
SCISSORS LAP 5X35 DISP (ENDOMECHANICALS) ×2 IMPLANT
SET IRRIG TUBING LAPAROSCOPIC (IRRIGATION / IRRIGATOR) ×2 IMPLANT
SLEEVE ENDOPATH XCEL 5M (ENDOMECHANICALS) ×4 IMPLANT
SPECIMEN JAR SMALL (MISCELLANEOUS) ×2 IMPLANT
SPONGE GAUZE 2X2 STER 10/PKG (GAUZE/BANDAGES/DRESSINGS) ×1
STRIP CLOSURE SKIN 1/2X4 (GAUZE/BANDAGES/DRESSINGS) ×2 IMPLANT
SUT MON AB 4-0 PC3 18 (SUTURE) ×2 IMPLANT
TOWEL OR 17X24 6PK STRL BLUE (TOWEL DISPOSABLE) ×2 IMPLANT
TOWEL OR 17X26 10 PK STRL BLUE (TOWEL DISPOSABLE) ×2 IMPLANT
TRAY LAPAROSCOPIC MC (CUSTOM PROCEDURE TRAY) ×2 IMPLANT
TROCAR XCEL BLUNT TIP 100MML (ENDOMECHANICALS) ×2 IMPLANT
TROCAR XCEL NON-BLD 11X100MML (ENDOMECHANICALS) IMPLANT
TROCAR XCEL NON-BLD 5MMX100MML (ENDOMECHANICALS) ×2 IMPLANT
TUBING INSUFFLATION (TUBING) ×2 IMPLANT

## 2016-06-01 NOTE — Progress Notes (Signed)
Pt medicated with iv zofran and morphine for nausea and pain

## 2016-06-01 NOTE — Anesthesia Preprocedure Evaluation (Addendum)
Anesthesia Evaluation  Patient identified by MRN, date of birth, ID band Patient awake    Reviewed: Allergy & Precautions, NPO status , Patient's Chart, lab work & pertinent test results  Airway Mallampati: I  TM Distance: >3 FB Neck ROM: Full    Dental  (+) Teeth Intact, Loose,    Pulmonary asthma ,    breath sounds clear to auscultation       Cardiovascular negative cardio ROS   Rhythm:Regular Rate:Normal     Neuro/Psych  Headaches, PSYCHIATRIC DISORDERS Anxiety  Neuromuscular disease    GI/Hepatic Neg liver ROS, GERD  Medicated and Controlled,  Endo/Other  negative endocrine ROS  Renal/GU negative Renal ROS  negative genitourinary   Musculoskeletal  (+) Arthritis ,   Abdominal   Peds negative pediatric ROS (+)  Hematology negative hematology ROS (+)   Anesthesia Other Findings Loose bridge per patient of anterior teeth.   Reproductive/Obstetrics negative OB ROS                            Lab Results  Component Value Date   WBC 6.6 05/28/2016   HGB 13.5 05/28/2016   HCT 40.3 05/28/2016   MCV 88.0 05/28/2016   PLT 235 05/28/2016   Lab Results  Component Value Date   CREATININE 0.84 05/28/2016   BUN 14 05/28/2016   NA 137 05/28/2016   K 4.2 05/28/2016   CL 108 05/28/2016   CO2 24 05/28/2016   No results found for: INR, PROTIME   Anesthesia Physical Anesthesia Plan  ASA: II  Anesthesia Plan: General   Post-op Pain Management:    Induction: Intravenous  Airway Management Planned: Oral ETT  Additional Equipment:   Intra-op Plan:   Post-operative Plan: Extubation in OR  Informed Consent: I have reviewed the patients History and Physical, chart, labs and discussed the procedure including the risks, benefits and alternatives for the proposed anesthesia with the patient or authorized representative who has indicated his/her understanding and acceptance.   Dental  advisory given  Plan Discussed with: CRNA  Anesthesia Plan Comments:        Anesthesia Quick Evaluation

## 2016-06-01 NOTE — H&P (View-Only) (Signed)
Angela CroissantBrenda S. Oliver 05/26/2016 11:01 AM Location: [facility] Patient #: [id number] DOB: 12/03/1966 Single / Language: [place] / Race: White Female  History of Present Illness Adolph Pollack(Shastina Rua J. Tyresse Jayson MD; 05/26/2016 11:31 AM) Patient words: gallbladder.  The patient is a 50 year old female.   Note:She is referred by Tandy GawJade Breeback, PA-C for consultation regarding symptomatic cholelithiasis. She ended up in the emergency department 21 May with a severe case of biliary colic. Liver function tests were normal. White blood cell count was normal. Ultrasound demonstrated multiple gallstones with no evidence of gallbladder wall thickening or pericholecystic fluid. Common bile duct diameter was within normal limits. She's had smaller episodes since then which typically follow a fatty meal. No family history of gallbladder disease.  Other Problems Milas Hock(Bernie Morris, CMA; 05/26/2016 11:44 AM) Anxiety Disorder Arthritis Asthma Back Pain Cholelithiasis Gastroesophageal Reflux Disease Hemorrhoids Migraine Headache  Past Surgical History Milas Hock(Bernie Morris, CMA; 05/26/2016 11:44 AM) Appendectomy Foot Surgery Bilateral. Tonsillectomy  Diagnostic Studies History Milas Hock(Bernie Morris, CMA; 05/26/2016 11:44 AM) Mammogram 1-3 years ago Pap Smear never  Allergies Milas Hock(Bernie Morris, CMA; 05/26/2016 11:10 AM) Sulfabenzamide *CHEMICALS* SUMAtriptan *MIGRAINE PRODUCTS* Nausea. Topiramate *ANTICONVULSANTS*  Medication History Milas Hock(Bernie Morris, CMA; 05/26/2016 11:10 AM) ZyrTEC (10MG  Tablet, Oral daily) Active. Singulair (10MG  Tablet, Oral daily) Active. Flexeril (10MG  Tablet, 1-2 Oral qhs) Active. Inderal (40MG  Tablet, Oral bid) Active. Zomig (2.5MG  Tablet, Oral as needed) Active. Carafate (1GM Tablet, Oral qid) Active. Albuterol (90MCG/ACT Aerosol Soln, 2 puffs Inhalation every four hours) Active. PriLOSEC (40MG  Capsule DR, Oral as needed) Active. Mobic (15MG  Tablet, 1-2 Oral daily)  Active. Acyclovir (400MG  Tablet, Oral bid) Active. Beclomethasone Diprop (Nasal) (42MCG/ACT Aerosol Soln, 2 puffs Nasal bid) Active. Zoloft (50MG  Tablet, Oral daily) Active. Acyclovir (5% Ointment, External every three hours prn) Active. Medications Reconciled  Social History Milas Hock(Bernie Morris, CMA; 05/26/2016 11:44 AM) Alcohol use Occasional alcohol use. Caffeine use Coffee. No drug use Tobacco use Never smoker.  Family History Milas Hock(Bernie Morris, [place]; 05/26/2016 11:44 AM) Alcohol Abuse Father. Arthritis Father, Mother. Breast Cancer Mother. Diabetes Mellitus Father. Migraine Headache Father.  Pregnancy / Birth History Milas Hock(Bernie Morris, CMA; 05/26/2016 11:44 AM) Age of menopause 51-55 Gravida 0 Irregular periods Para 0     Review of Systems Cyndra Numbers(Bernie Morris CMA; 05/26/2016 11:44 AM) General Present- Appetite Loss, Fatigue and Weight Gain. Not Present- Chills, Fever, Night Sweats and Weight Loss. Skin Present- Dryness. Not Present- Change in Wart/Mole, Hives, Jaundice, New Lesions, Non-Healing Wounds, Rash and Ulcer. HEENT Present- Seasonal Allergies. Not Present- Earache, Hearing Loss, Hoarseness, Nose Bleed, Oral Ulcers, Ringing in the Ears, Sinus Pain, Sore Throat, Visual Disturbances, Wears glasses/contact lenses and Yellow Eyes. Respiratory Present- Difficulty Breathing. Not Present- Bloody sputum, Chronic Cough, Snoring and Wheezing. Cardiovascular Present- Shortness of Breath and Swelling of Extremities. Not Present- Chest Pain, Difficulty Breathing Lying Down, Leg Cramps, Palpitations and Rapid Heart Rate. Gastrointestinal Present- Abdominal Pain, Bloating, Change in Bowel Habits and Constipation. Not Present- Bloody Stool, Chronic diarrhea, Difficulty Swallowing, Excessive gas, Gets full quickly at meals, Hemorrhoids, Indigestion, Nausea, Rectal Pain and Vomiting. Female Genitourinary Not Present- Frequency, Nocturia, Painful Urination, Pelvic Pain and  Urgency. Musculoskeletal Present- Back Pain, Joint Pain and Joint Stiffness. Not Present- Muscle Pain, Muscle Weakness and Swelling of Extremities. Neurological Present- Headaches and Tingling. Not Present- Decreased Memory, Fainting, Numbness, Seizures, Tremor, Trouble walking and Weakness. Psychiatric Present- Anxiety. Not Present- Bipolar, Change in Sleep Pattern, Depression, Fearful and Frequent crying. Endocrine Present- Heat Intolerance and Hot flashes. Not Present- Cold Intolerance, Excessive Hunger, Hair  Changes and New Diabetes. Hematology Not Present- Easy Bruising, Excessive bleeding, Gland problems, HIV and Persistent Infections.  Vitals (Bernie Morris CMA; 05/26/2016 11:01 AM) 05/26/2016 11:01 AM Weight: 185.8 lb Height: 64in Body Surface Area: 1.9 m Body Mass Index: 31.89 kg/m  Temp.: 98.3F(Oral)  Pulse: 80 (Regular)  BP: 120/80 (Sitting, Left Arm, Standard)      Physical Exam (Tyaira Heward J. Gladine Plude MD; 05/26/2016 11:32 AM)  The physical exam findings are as follows: Note:General: Overweight female in NAD. Pleasant and cooperative.  HEENT: [place]/AT, no facial masses  EYES: EOMI, no icterus  NECK: Supple, no obvious mass or thyroid enlargement.  CV: RRR, no murmur, no JVD.  CHEST: Breath sounds equal and clear. Respirations nonlabored.   ABDOMEN: Soft, mild RUQ tenderness, nondistended, no masses, no organomegaly, RLQ scar  MUSCULOSKELETAL: FROM, good muscle tone, no edema, no venous stasis changes  SKIN: No jaundice.  NEUROLOGIC: Alert and oriented, answers questions appropriately.  PSYCHIATRIC: Normal mood, affect , and behavior.    Assessment & Plan (Yazan Gatling J. Kamoni Gentles MD; 05/26/2016 11:33 AM)  SYMPTOMATIC CHOLELITHIASIS (K80.20) Impression: Frequency of symptoms is increasing and is related to a fatty meal. I recommended laparoscopic cholecystectomy and she is in agreement with this.  Plan: Strict low fat diet. Schedule laparoscopic  cholecystectomy with cholangiogram. If she has a severe episode of pain with vomiting and fever, I instructed her to go to the emergency room. I have explained the procedure, risks, and aftercare of cholecystectomy. Risks include but are not limited to bleeding, infection, wound problems, anesthesia, diarrhea, bile leak, injury to common bile duct/liver/intestine. She seems to understand and agrees to proceed.  Martavious Hartel, MD 

## 2016-06-01 NOTE — Interval H&P Note (Signed)
History and Physical Interval Note:  06/01/2016 8:46 AM  Angela Oliver  has presented today for surgery, with the diagnosis of Symptomatic cholelithiasis  The various methods of treatment have been discussed with the patient and family. After consideration of risks, benefits and other options for treatment, the patient has consented to  Procedure(s): LAPAROSCOPIC CHOLECYSTECTOMY WITH INTRAOPERATIVE CHOLANGIOGRAM (N/A) as a surgical intervention .  The patient's history has been reviewed, patient examined, no change in status, stable for surgery.  I have reviewed the patient's chart and labs.  Questions were answered to the patient's satisfaction.     Lamarr Feenstra JShela Commons

## 2016-06-01 NOTE — Interval H&P Note (Signed)
History and Physical Interval Note:  06/01/2016 8:37 AM  Angela Oliver  has presented today for surgery, with the diagnosis of Symptomatic cholelithiasis  The various methods of treatment have been discussed with the patient and family. After consideration of risks, benefits and other options for treatment, the patient has consented to  Procedure(s): LAPAROSCOPIC CHOLECYSTECTOMY WITH INTRAOPERATIVE CHOLANGIOGRAM (N/A) as a surgical intervention .  The patient's history has been reviewed, patient examined, no change in status, stable for surgery.  I have reviewed the patient's chart and labs.  Questions were answered to the patient's satisfaction.     Armaan Pond JShela Commons

## 2016-06-01 NOTE — Anesthesia Procedure Notes (Signed)
Procedure Name: Intubation Date/Time: 06/01/2016 9:07 AM Performed by: Elyn PeersALLEN, Karolee Meloni J Pre-anesthesia Checklist: Patient identified, Emergency Drugs available, Suction available, Patient being monitored and Timeout performed Patient Re-evaluated:Patient Re-evaluated prior to inductionOxygen Delivery Method: Circle system utilized Preoxygenation: Pre-oxygenation with 100% oxygen Intubation Type: IV induction Ventilation: Mask ventilation without difficulty Laryngoscope Size: Miller and 3 Grade View: Grade I Tube type: Oral Tube size: 7.5 mm Number of attempts: 1 Airway Equipment and Method: Stylet Placement Confirmation: ETT inserted through vocal cords under direct vision,  positive ETCO2 and breath sounds checked- equal and bilateral Secured at: 22 cm Tube secured with: Tape Dental Injury: Teeth and Oropharynx as per pre-operative assessment

## 2016-06-01 NOTE — Discharge Instructions (Signed)
LAPAROSCOPIC SURGERY: POST OP INSTRUCTIONS  1. DIET: Follow a liquid diet the first 48 hours after arrival home, such as soup, liquids, crackers, etc.  Be sure to include lots of fluids daily.  Avoid fast food or heavy meals as your are more likely to get nauseated.  Eat a low fat after 48 hours.   2. Take your usually prescribed home medications unless otherwise directed. 3. PAIN CONTROL: a. Pain is best controlled by a usual combination of three different methods TOGETHER: i. Ice/Heat ii. Over the counter pain medication iii. Prescription pain medication b. Most patients will experience some swelling and bruising around the incisions.  Ice packs or heating pads (30-60 minutes up to 6 times a day) will help. Use ice for the first few days to help decrease swelling and bruising, then switch to heat to help relax tight/sore spots and speed recovery.  Some people prefer to use ice alone, heat alone, alternating between ice & heat.  Experiment to what works for you.  Swelling and bruising can take several weeks to resolve.   c. It is helpful to take an over-the-counter pain medication regularly for the first few weeks.  Choose one of the following that works best for you: i. Naproxen (Aleve, etc)  Two 220mg  tabs twice a day ii. Ibuprofen (Advil, etc) Three 200mg  tabs four times a day (every meal & bedtime) iii. Acetaminophen (Tylenol, etc) 500-650mg  four times a day (every meal & bedtime) d. A  prescription for pain medication (such as oxycodone, hydrocodone, etc) should be given to you upon discharge.  Take your pain medication as prescribed.  i. If you are having problems/concerns with the prescription medicine (does not control pain, nausea, vomiting, rash, itching, etc), please call us [phone] to see if we need to switch you to a different pain medicine that will work better for you and/or control your side effect better. ii. If you need a refill on your pain medication, please contact your  pharmacy.  They will contact our office to request authorization. Prescriptions will not be filled after 5 pm or on week-ends. 4. Avoid getting constipated.  Between the surgery and the pain medications, it is common to experience some constipation.  Increasing fluid intake and taking a fiber supplement (such as Metamucil, Citrucel, FiberCon, MiraLax, etc) 1-2 times a day regularly will usually help prevent this problem from occurring.  A mild laxative (prune juice, Milk of Magnesia, MiraLax, etc) should be taken according to package directions if there are no bowel movements after 48 hours.   5. Watch out for diarrhea.  If you have many loose bowel movements, simplify your diet to bland foods & liquids for a few days.  Stop any stool softeners and decrease your fiber supplement.  Switching to mild anti-diarrheal medications (Kayopectate, Pepto Bismol) can help.  If this worsens or does not improve, please call us. 6. Wash / shower every day.  You may shower over the dressings as they are waterproof.  Continue to shower over incision(s) after the dressing is off. 7. Remove your waterproof bandages 3 days after surgery.  You may leave the incision open to air.  You may replace a dressing/Band-Aid to cover the incision for comfort if you wish.  8. ACTIVITIES as tolerated:   a. You may resume regular (light) daily activities beginning the next day--such as daily self-care, walking, climbing stairs--gradually increasing light activities as tolerated.  No heavy lifting (over 10 pounds), straining, or intense activities for 2  weeks. b. DO NOT PUSH THROUGH PAIN.  Let pain be your guide: If it hurts to do something, don't do it.  Pain is your body warning you to avoid that activity for another week until the pain goes down. c. You may drive when you are no longer taking prescription pain medication, you can comfortably wear a seatbelt, and you can safely maneuver your car and apply brakes. d. Angela Oliver may have sexual  intercourse when it is comfortable.  9. FOLLOW UP in our office a. Please call [facility] at [phone] to set up an appointment to see your surgeon in the office for a follow-up appointment approximately 2-3 weeks after your surgery. b. Make sure that you call for this appointment the day you arrive home to insure a convenient appointment time. 10. IF YOU HAVE DISABILITY OR FAMILY LEAVE FORMS, BRING THEM TO THE OFFICE FOR PROCESSING.  DO NOT GIVE THEM TO YOUR DOCTOR.  11.  Return to work/school:  Desk work/light work in 5-7 days, full duty/activities in 2 weeks if pain-free.   WHEN TO CALL us [phone]: 1. Poor pain control 2. Reactions / problems with new medications (rash/itching, nausea, etc)  3. Fever over 101.5 F (38.5 C) 4. Inability to urinate 5. Nausea and/or vomiting 6. Worsening swelling or bruising 7. Continued bleeding from incision. 8. Increased pain, redness, or drainage from the incision   The clinic staff is available to answer your questions during regular business hours (8:30am-5pm).  Please dont hesitate to call and ask to speak to one of our nurses for clinical concerns.   If you have a medical emergency, go to the nearest emergency room or call 911.  A surgeon from [facility] is always on call at the [facility], [place] [address], [address], [place], [place]  [address] ? MAIN: [phone] ? TOLL FREE: [phone] ?  FAX [phone] [email or web address]

## 2016-06-01 NOTE — Anesthesia Postprocedure Evaluation (Signed)
Anesthesia Post Note  Patient: Angela Oliver  Procedure(s) Performed: Procedure(s) (LRB): LAPAROSCOPIC CHOLECYSTECTOMY WITH INTRAOPERATIVE CHOLANGIOGRAM, REMOVAL PERITONEAL NODULE ADJACENT TO SIGMOID COLON (N/A)  Patient location during evaluation: PACU Anesthesia Type: General Level of consciousness: awake and alert Pain management: pain level controlled Vital Signs Assessment: post-procedure vital signs reviewed and stable Respiratory status: spontaneous breathing, nonlabored ventilation, respiratory function stable and patient connected to nasal cannula oxygen Cardiovascular status: blood pressure returned to baseline and stable Postop Assessment: no signs of nausea or vomiting Anesthetic complications: no    Last Vitals:  Filed Vitals:   06/01/16 1315 06/01/16 1341  BP:  140/69  Pulse: 73 73  Temp: 36.3 C 36.4 C  Resp: 13 16    Last Pain:  Filed Vitals:   06/01/16 1401  PainSc: 7                  Shelton SilvasKevin D Reg Bircher

## 2016-06-01 NOTE — Transfer of Care (Signed)
Immediate Anesthesia Transfer of Care Note  Patient: Hubert AzureBrenda Kreutzer  Procedure(s) Performed: Procedure(s): LAPAROSCOPIC CHOLECYSTECTOMY WITH INTRAOPERATIVE CHOLANGIOGRAM, REMOVAL PERITONEAL NODULE ADJACENT TO SIGMOID COLON (N/A)  Patient Location: PACU  Anesthesia Type:General  Level of Consciousness: awake, sedated and responds to stimulation  Airway & Oxygen Therapy: Patient Spontanous Breathing and Patient connected to nasal cannula oxygen  Post-op Assessment: Report given to RN and Post -op Vital signs reviewed and stable  Post vital signs: Reviewed and stable  Last Vitals:  Filed Vitals:   06/01/16 0800  BP: 139/83  Pulse: 64  Temp: 37.1 C  Resp: 20    Last Pain:  Filed Vitals:   06/01/16 0809  PainSc: 4          Complications: No apparent anesthesia complications

## 2016-06-01 NOTE — Op Note (Signed)
OPERATIVE NOTE-LAPAROSCOPIC CHOLECYSTECTOMY  Preoperative diagnosis:  Symptomatic cholelithiasis  Postoperative diagnosis:  Same plus peritoneal nodule adjacent to sigmoid colon  Procedure: Laparoscopic cholecystectomy with cholangiogram. Excision of peritoneal nodule  Surgeon: Avel Peace, M.D.  Asst.:  Wenda Low, MD  Anesthesia: General  Indication:   This is a 50 year female with symptomatic cholelithiasis.  She presents for elective cholecystectomy.  Technique: She was brought to the operating room, placed supine on the operating table, and a general anesthetic was administered.  The abdominal wall was then sterilely prepped and draped.  A timeout was performed.    Local anesthetic (Marcaine) was infiltrated in the subumbilical region. A small subumbilical incision was made through the skin, subcutaneous tissue, fascia, and peritoneum entering the peritoneal cavity under direct vision. A pursestring suture of 0 Vicryl was placed around the edges of the fascia. A Hassan trocar was introduced into the peritoneal cavity and a pneumoperitoneum was created by insufflation of carbon dioxide gas. The laparoscope was introduced into the trocar and no underlying bleeding or organ injury was noted. There was a peritoneal nodule attached to an appendices epiploica of the sigmoid colon noted.  She was then placed in the reverse Trendelenburg position with the right side tilted slightly up.  Three 5 mm trocars were then placed into the abdominal cavity under laparoscopic vision. One in the epigastric area, and 2 in the right upper quadrant area. The gallbladder was visualized and the fundus was grasped and retracted toward the right shoulder.  The infundibulum was mobilized with dissection close to the gallbladder and retracted laterally. The cystic duct was identified and a window was created around it. The anterior branch of the cystic artery was also identified, anterior to the cystic duct,  and  a window was created around it. It was clipped and divided.  The posterior branch of the cystic artery was isolated in the triangle of Calot.  It was clipped and divided which allowed the critical view to be achieved. A clip was placed at the neck of the gallbladder. A small incision was made in the cystic duct. A small, soft stone was milked out of the cystic duct and removed from the peritoneal cavity.  A cholangiocatheter was then introduced through the anterior abdominal wall and placed in the cystic duct. A intraoperative cholangiogram was then performed.  Under real-time fluoroscopy, dilute contrast was injected into the cystic duct.  The common hepatic duct, the right and left hepatic ducts, and the common duct were all visualized. Initially, contrast did not drain into the duodenum.  The catheter was flushed with saline and the IOC repeated.  Now, contrast drained into the duodenum without obvious evidence of any obstructing ductal lesion. The final report is pending the Radiologist's interpretation.  The cholangiocatheter was removed, the cystic duct was clipped 3 times on the biliary side, and then the cystic duct was divided sharply. No bile leak was noted from the cystic duct stump.  Following this the gallbladder was dissected free from the liver using electrocautery. There was some bile leakage from two punctate holes in the gallbladder.  The gallbladder was then placed in a retrieval bag and removed from the abdominal cavity through the subumbilical incision.  The gallbladder fossa was inspected, copiously irrigated, and bleeding was controlled with electrocautery. Inspection showed that hemostasis was adequate and there was no evidence of bile leak.  The irrigation fluid was evacuated as much as possible.  Next, the peritoneal nodule was excised sharply and sent  to pathology.  Bleeding was controlled with electrocautery.   The subumbilical trocar was removed and the fascial defect was  closed by tightening and tying down the pursestring suture under laparoscopic vision.  The remaining trocars were removed and the pneumoperitoneum was released. The skin incisions were closed with 4-0 Monocryl subcuticular stitches. Steri-Strips and sterile dressings were applied.  The procedure was well-tolerated without any apparent complications. She was taken to the recovery room in satisfactory condition.

## 2016-06-02 ENCOUNTER — Encounter (HOSPITAL_COMMUNITY): Payer: Self-pay | Admitting: General Practice

## 2016-06-02 DIAGNOSIS — Z79899 Other long term (current) drug therapy: Secondary | ICD-10-CM | POA: Diagnosis not present

## 2016-06-02 DIAGNOSIS — M1990 Unspecified osteoarthritis, unspecified site: Secondary | ICD-10-CM | POA: Diagnosis not present

## 2016-06-02 DIAGNOSIS — G43909 Migraine, unspecified, not intractable, without status migrainosus: Secondary | ICD-10-CM | POA: Diagnosis not present

## 2016-06-02 DIAGNOSIS — J45909 Unspecified asthma, uncomplicated: Secondary | ICD-10-CM | POA: Diagnosis not present

## 2016-06-02 DIAGNOSIS — Z791 Long term (current) use of non-steroidal anti-inflammatories (NSAID): Secondary | ICD-10-CM | POA: Diagnosis not present

## 2016-06-02 DIAGNOSIS — K668 Other specified disorders of peritoneum: Secondary | ICD-10-CM | POA: Diagnosis not present

## 2016-06-02 DIAGNOSIS — K8010 Calculus of gallbladder with chronic cholecystitis without obstruction: Secondary | ICD-10-CM | POA: Diagnosis not present

## 2016-06-02 DIAGNOSIS — K219 Gastro-esophageal reflux disease without esophagitis: Secondary | ICD-10-CM | POA: Diagnosis not present

## 2016-06-02 MED ORDER — ASPIRIN-ACETAMINOPHEN-CAFFEINE 250-250-65 MG PO TABS
2.0000 | ORAL_TABLET | Freq: Two times a day (BID) | ORAL | Status: DC | PRN
  Filled 2016-06-02: qty 2

## 2016-06-02 NOTE — Progress Notes (Signed)
1 Day Post-Op  Subjective: Had postop n/v yesterday but that is better this AM.  Pain well controlled.  Biggest problem this morning is migraine headache.  Objective: Vital signs in last 24 hours: Temp:  [97.3 F (36.3 C)-98.3 F (36.8 C)] 97.7 F (36.5 C) (06/21 0651) Pulse Rate:  [62-83] 75 (06/21 0651) Resp:  [9-23] 16 (06/21 0651) BP: (100-140)/(54-77) 118/65 mmHg (06/21 0651) SpO2:  [89 %-98 %] 97 % (06/21 0651) Weight:  [83.4 kg (183 lb 13.8 oz)] 83.4 kg (183 lb 13.8 oz) (06/20 1341) Last BM Date: 05/31/16  Intake/Output from previous day: 06/20 0701 - 06/21 0700 In: 2400 [P.O.:1200; I.V.:1200] Out: 3575 [Urine:3550; Blood:25] Intake/Output this shift: Total I/O In: -  Out: 400 [Urine:400]  PE: General- In NAD Abdomen-soft, dressings dry  Lab Results:  No results for input(s): WBC, HGB, HCT, PLT in the last 72 hours. BMET No results for input(s): NA, K, CL, CO2, GLUCOSE, BUN, CREATININE, CALCIUM in the last 72 hours. PT/INR No results for input(s): LABPROT, INR in the last 72 hours. Comprehensive Metabolic Panel:    Component Value Date/Time   NA 137 05/28/2016 1312   NA 136 05/03/2016 1138   K 4.2 05/28/2016 1312   K 4.2 05/03/2016 1138   CL 108 05/28/2016 1312   CL 103 05/03/2016 1138   CO2 24 05/28/2016 1312   CO2 28 05/03/2016 1138   BUN 14 05/28/2016 1312   BUN 17 05/03/2016 1138   CREATININE 0.84 05/28/2016 1312   CREATININE 0.89 05/03/2016 1138   CREATININE 0.76 07/16/2014 1206   GLUCOSE 96 05/28/2016 1312   GLUCOSE 99 05/03/2016 1138   CALCIUM 9.0 05/28/2016 1312   CALCIUM 9.4 05/03/2016 1138   AST 13 05/03/2016 1138   AST 21 07/16/2014 1206   ALT 12 05/03/2016 1138   ALT 25 07/16/2014 1206   ALKPHOS 65 05/03/2016 1138   ALKPHOS 46 07/16/2014 1206   BILITOT 0.7 05/03/2016 1138   BILITOT 0.7 07/16/2014 1206   PROT 6.8 05/03/2016 1138   PROT 6.5 07/16/2014 1206   ALBUMIN 4.3 05/03/2016 1138   ALBUMIN 3.7 07/16/2014 1206      Studies/Results: Dg Cholangiogram Operative  06/01/2016  CLINICAL DATA:  Cholelithiasis, right upper quadrant pain EXAM: INTRAOPERATIVE CHOLANGIOGRAM TECHNIQUE: Cholangiographic images from the C-arm fluoroscopic device were submitted for interpretation post-operatively. Please see the procedural report for the amount of contrast and the fluoroscopy time utilized. COMPARISON:  05/02/2016 FINDINGS: Intraoperative cholangiogram performed during the laparoscopic cholecystectomy. The biliary confluence, common hepatic duct, residual cystic duct, and common bile duct are patent. Contrast eventually drains into the duodenum. No dilatation or obstruction. No filling defect or stricture. IMPRESSION: Patent biliary system. Electronically Signed   By: Judie PetitM.  Shick M.D.   On: 06/01/2016 10:05    Anti-infectives: Anti-infectives    Start     Dose/Rate Route Frequency Ordered Stop   06/01/16 0900  ceFAZolin (ANCEF) IVPB 2g/100 mL premix     2 g 200 mL/hr over 30 Minutes [facility] 05/31/16 1457 06/01/16 0909      Assessment Principal Problem:   Symptomatic cholelithiasis s/p lap chole with IOC-postop n/v better Active Problems:   Migraine headache-home medications given      Plan: Discharge after lunch when she can get a ride.   Kashira Behunin Shela CommonsJ 06/02/2016

## 2016-06-02 NOTE — Progress Notes (Signed)
Patient discharged to home with instructions. 

## 2016-06-03 ENCOUNTER — Other Ambulatory Visit: Payer: Self-pay | Admitting: Physician Assistant

## 2016-06-09 ENCOUNTER — Other Ambulatory Visit: Payer: Self-pay | Admitting: Physician Assistant

## 2016-06-10 NOTE — Telephone Encounter (Signed)
Need a follow up appt. Ok to refill for next month

## 2016-06-10 NOTE — Telephone Encounter (Signed)
Ok to fill?  Didn't see a proper dx on her problem list for sertraline.

## 2016-06-17 ENCOUNTER — Other Ambulatory Visit: Payer: Self-pay | Admitting: Physician Assistant

## 2016-06-26 ENCOUNTER — Other Ambulatory Visit: Payer: Self-pay | Admitting: Physician Assistant

## 2016-07-16 ENCOUNTER — Other Ambulatory Visit: Payer: Self-pay | Admitting: Physician Assistant

## 2016-07-16 DIAGNOSIS — Z9049 Acquired absence of other specified parts of digestive tract: Secondary | ICD-10-CM | POA: Insufficient documentation

## 2016-07-21 ENCOUNTER — Other Ambulatory Visit: Payer: Self-pay | Admitting: Physician Assistant

## 2016-07-24 ENCOUNTER — Encounter: Payer: Self-pay | Admitting: Emergency Medicine

## 2016-07-24 ENCOUNTER — Emergency Department (INDEPENDENT_AMBULATORY_CARE_PROVIDER_SITE_OTHER)
Admission: EM | Admit: 2016-07-24 | Discharge: 2016-07-24 | Disposition: A | Discharge status: Home / Self Care | Payer: Federal, State, Local not specified - PPO | Source: Home / Self Care

## 2016-07-24 DIAGNOSIS — M545 Low back pain: Secondary | ICD-10-CM

## 2016-07-24 MED ORDER — TIZANIDINE HCL 4 MG PO CAPS: 4.0000 mg | ORAL_CAPSULE | Freq: Three times a day (TID) | ORAL | 0 refills | Status: DC

## 2016-07-24 MED ORDER — KETOROLAC TROMETHAMINE 60 MG/2ML IM SOLN
60.0000 mg | Freq: Once | INTRAMUSCULAR | Status: AC
  Administered 2016-07-24: 60 mg via INTRAMUSCULAR

## 2016-07-24 MED ORDER — METHYLPREDNISOLONE SODIUM SUCC 125 MG IJ SOLR
125.0000 mg | Freq: Once | INTRAMUSCULAR | Status: AC
  Administered 2016-07-24: 125 mg via INTRAMUSCULAR

## 2016-07-24 NOTE — ED Triage Notes (Signed)
Pt c/o low back pain x 1 day, bending over to clean up broken glass, tried Naproxen , Ibuprofen and Icey Hot w/no relief.

## 2016-07-24 NOTE — ED Provider Notes (Signed)
CSN: [id number]     Arrival date & time 07/24/16  1110 History   None    Chief Complaint  Patient presents with  . Back Pain   (Consider location/radiation/quality/duration/timing/severity/associated sxs/prior Treatment) The history is provided by the patient. No language interpreter was used.  Back Pain  Location:  Lumbar spine Quality:  Aching Radiates to:  Does not radiate Pain severity:  Moderate Pain is:  Same all the time Onset quality:  Gradual Duration:  1 day Timing:  Constant Chronicity:  New Context: twisting   Context: not recent injury   Relieved by:  Nothing Worsened by:  Nothing Ineffective treatments:  None tried Associated symptoms: no abdominal pain and no tingling     Past Medical History:  Diagnosis Date  . Anxiety   . Arthritis   . Asthma   . Complication of anesthesia    slow to awaken  . Family history of adverse reaction to anesthesia    mother slow to awaken  . GERD (gastroesophageal reflux disease)   . Herpes simplex   . Irregular heart beat    in the past- no palpations- "skipped a beat"  . Migraines   . Seasonal allergies    Past Surgical History:  Procedure Laterality Date  . APPENDECTOMY    . CHOLECYSTECTOMY  06/01/2016   laproscopic   . CHOLECYSTECTOMY N/A 06/01/2016   Procedure: LAPAROSCOPIC CHOLECYSTECTOMY WITH INTRAOPERATIVE CHOLANGIOGRAM, REMOVAL PERITONEAL NODULE ADJACENT TO SIGMOID COLON;  Surgeon: Avel Peace, MD;  Location: [facility] OR;  Service: General;  Laterality: N/A;  . COLONOSCOPY     age 65  . OVARIAN CYST REMOVAL Left    part of ovary- cyst hadf ruptured   Family History  Problem Relation Age of Onset  . Cancer Mother     breast cancer  . Alcohol abuse Father   . Diabetes Father   . Hyperlipidemia Father   . Asthma Other    Social History  Substance Use Topics  . Smoking status: Never Smoker  . Smokeless tobacco: Never Used  . Alcohol use No   OB History    No data available     Review of Systems   Gastrointestinal: Negative for abdominal pain.  Musculoskeletal: Positive for back pain.  Neurological: Negative for tingling.  All other systems reviewed and are negative.   Allergies  Sulfa antibiotics; Sulfonamide derivatives; and Topamax [topiramate]  Home Medications   Prior to Admission medications   Medication Sig Start Date End Date Taking? Authorizing Provider  Albuterol Sulfate (PROAIR RESPICLICK) 108 (90 BASE) MCG/ACT AEPB Inhale 2 puffs into the lungs every 4 (four) hours as needed. Patient taking differently: Inhale 2 puffs into the lungs every 4 (four) hours as needed (shortness of breath/ wheezing).  09/16/15   Rodolph Bong, MD  beclomethasone (QVAR) 80 MCG/ACT inhaler Inhale 2 puffs into the lungs 2 (two) times daily. 05/31/16   Jade L Breeback, PA-C  cetirizine (ZYRTEC) 10 MG tablet Take 10 mg by mouth daily.    Historical Provider, MD  montelukast (SINGULAIR) 10 MG tablet TAKE 1 TABLET (10 MG TOTAL) BY MOUTH DAILY. Patient taking differently: Take 10 mg by mouth daily.  05/03/16   Jade L Breeback, PA-C  omeprazole (PRILOSEC) 40 MG capsule TAKE 1 CAPSULE (40 MG TOTAL) BY MOUTH DAILY. 09/03/15   Jade L Breeback, PA-C  SUMAtriptan (IMITREX) 50 MG tablet TAKE 1 TABLET (50 MG TOTAL) BY MOUTH EVERY 2 (TWO) HOURS AS NEEDED. Patient taking differently: Take 50 mg by  mouth every 2 (two) hours as needed for migraine or headache.  11/24/15   Jade L Breeback, PA-C  ZOLMitriptan (ZOMIG) 2.5 MG tablet Take 1 tablet (2.5 mg total) by mouth as needed for migraine. Patient taking differently: Take 2.5 mg by mouth every 4 (four) hours as needed for migraine.  11/24/15   Jomarie LongsJade L Breeback, PA-C   Meds Ordered and Administered this Visit   Medications  methylPREDNISolone sodium succinate (SOLU-MEDROL) 125 mg/2 mL injection 125 mg (not administered)  ketorolac (TORADOL) injection 60 mg (not administered)    BP 139/92 (BP Location: Left Arm)   Pulse 70   Temp 98.9 F (37.2 C) (Oral)   Ht  5\' 4"  (1.626 m)   Wt 180 lb (81.6 kg)   LMP 07/13/2016 (Exact Date)   SpO2 98%   BMI 30.90 kg/m  No data found.   Physical Exam  Constitutional: She appears well-developed and well-nourished. No distress.  HENT:  Head: Normocephalic and atraumatic.  Eyes: Conjunctivae are normal.  Neck: Neck supple.  Cardiovascular: Normal rate and regular rhythm.   No murmur heard. Pulmonary/Chest: Effort normal and breath sounds normal. No respiratory distress.  Abdominal: Soft. There is no tenderness.  Musculoskeletal: She exhibits tenderness. She exhibits no edema.  Tender lower lumbar area, pain with movement, ns and ns intact   Neurological: She is alert.  Skin: Skin is warm and dry.  Psychiatric: She has a normal mood and affect.  Nursing note and vitals reviewed.   Urgent Care Course   Clinical Course    Procedures (including critical care time)  Labs Review Labs Reviewed - No data to display  Imaging Review No results found.   Visual Acuity Review  Right Eye Distance:   Left Eye Distance:   Bilateral Distance:    Right Eye Near:   Left Eye Near:    Bilateral Near:         MDM  I will try torodol and solumedrol.  Pt reports she can not take many medications because she works for [employer]   1. Low back pain without sciatica, unspecified back pain laterality    Meds ordered this encounter  Medications  . methylPREDNISolone sodium succinate (SOLU-MEDROL) 125 mg/2 mL injection 125 mg  . ketorolac (TORADOL) injection 60 mg   An After Visit Summary was printed and given to the patient.   Lonia SkinnerLeslie K Lake ShastinaSofia, PA-C 07/24/16 1204

## 2016-07-28 ENCOUNTER — Ambulatory Visit (INDEPENDENT_AMBULATORY_CARE_PROVIDER_SITE_OTHER): Payer: Federal, State, Local not specified - PPO | Admitting: Physician Assistant

## 2016-07-28 ENCOUNTER — Encounter: Payer: Self-pay | Admitting: Physician Assistant

## 2016-07-28 VITALS — BP 139/71 | HR 72 | Ht 64.0 in | Wt 193.0 lb

## 2016-07-28 DIAGNOSIS — S335XXD Sprain of ligaments of lumbar spine, subsequent encounter: Secondary | ICD-10-CM

## 2016-07-28 DIAGNOSIS — M545 Low back pain, unspecified: Secondary | ICD-10-CM

## 2016-07-28 DIAGNOSIS — S335XXA Sprain of ligaments of lumbar spine, initial encounter: Secondary | ICD-10-CM | POA: Insufficient documentation

## 2016-07-28 DIAGNOSIS — S339XXD Sprain of unspecified parts of lumbar spine and pelvis, subsequent encounter: Secondary | ICD-10-CM

## 2016-07-28 MED ORDER — PREDNISONE 20 MG PO TABS: ORAL_TABLET | ORAL | 0 refills | Status: DC

## 2016-07-28 NOTE — Patient Instructions (Signed)
Low Back Sprain With Rehab A sprain is an injury in which a ligament is torn. The ligaments of the lower back are vulnerable to sprains. However, they are strong and require great force to be injured. These ligaments are important for stabilizing the spinal column. Sprains are classified into three categories. Grade 1 sprains cause pain, but the tendon is not lengthened. Grade 2 sprains include a lengthened ligament, due to the ligament being stretched or partially ruptured. With grade 2 sprains there is still function, although the function may be decreased. Grade 3 sprains involve a complete tear of the tendon or muscle, and function is usually impaired. SYMPTOMS   Severe pain in the lower back.  Sometimes, a feeling of a "pop," "snap," or tear, at the time of injury.  Tenderness and sometimes swelling at the injury site.  Uncommonly, bruising (contusion) within 48 hours of injury.  Muscle spasms in the back. CAUSES  Low back sprains occur when a force is placed on the ligaments that is greater than they can handle. Common causes of injury include:  Performing a stressful act while off-balance.  Repetitive stressful activities that involve movement of the lower back.  Direct hit (trauma) to the lower back. RISK INCREASES WITH:  Contact sports (football, wrestling).  Collisions (major skiing accidents).  Sports that require throwing or lifting (baseball, weightlifting).  Sports involving twisting of the spine (gymnastics, diving, tennis, golf).  Poor strength and flexibility.  Inadequate protection.  Previous back injury or surgery (especially fusion). PREVENTION  Wear properly fitted and padded protective equipment.  Warm up and stretch properly before activity.  Allow for adequate recovery between workouts.  Maintain physical fitness:  Strength, flexibility, and endurance.  Cardiovascular fitness.  Maintain a healthy body weight. PROGNOSIS  If treated properly,  low back sprains usually heal with non-surgical treatment. The length of time for healing depends on the severity of the injury.  RELATED COMPLICATIONS   Recurring symptoms, resulting in a chronic problem.  Chronic inflammation and pain in the low back.  Delayed healing or resolution of symptoms, especially if activity is resumed too soon.  Prolonged impairment.  Unstable or arthritic joints of the low back. TREATMENT  Treatment first involves the use of ice and medicine, to reduce pain and inflammation. The use of strengthening and stretching exercises may help reduce pain with activity. These exercises may be performed at home or with a therapist. Severe injuries may require referral to a therapist for further evaluation and treatment, such as ultrasound. Your caregiver may advise that you wear a back brace or corset, to help reduce pain and discomfort. Often, prolonged bed rest results in greater harm then benefit. Corticosteroid injections may be recommended. However, these should be reserved for the most serious cases. It is important to avoid using your back when lifting objects. At night, sleep on your back on a firm mattress, with a pillow placed under your knees. If non-surgical treatment is unsuccessful, surgery may be needed.  MEDICATION   If pain medicine is needed, nonsteroidal anti-inflammatory medicines (aspirin and ibuprofen), or other minor pain relievers (acetaminophen), are often advised.  Do not take pain medicine for 7 days before surgery.  Prescription pain relievers may be given, if your caregiver thinks they are needed. Use only as directed and only as much as you need.  Ointments applied to the skin may be helpful.  Corticosteroid injections may be given by your caregiver. These injections should be reserved for the most serious cases, because   they may only be given a certain number of times. HEAT AND COLD  Cold treatment (icing) should be applied for 10 to 15  minutes every 2 to 3 hours for inflammation and pain, and immediately after activity that aggravates your symptoms. Use ice packs or an ice massage.  Heat treatment may be used before performing stretching and strengthening activities prescribed by your caregiver, physical therapist, or athletic trainer. Use a heat pack or a warm water soak. SEEK MEDICAL CARE IF:   Symptoms get worse or do not improve in 2 to 4 weeks, despite treatment.  You develop numbness or weakness in either leg.  You lose bowel or bladder function.  Any of the following occur after surgery: fever, increased pain, swelling, redness, drainage of fluids, or bleeding in the affected area.  New, unexplained symptoms develop. (Drugs used in treatment may produce side effects.) EXERCISES  RANGE OF MOTION (ROM) AND STRETCHING EXERCISES - Low Back Sprain Most people with lower back pain will find that their symptoms get worse with excessive bending forward (flexion) or arching at the lower back (extension). The exercises that will help resolve your symptoms will focus on the opposite motion.  Your physician, physical therapist or athletic trainer will help you determine which exercises will be most helpful to resolve your lower back pain. Do not complete any exercises without first consulting with your caregiver. Discontinue any exercises which make your symptoms worse, until you speak to your caregiver. If you have pain, numbness or tingling which travels down into your buttocks, leg or foot, the goal of the therapy is for these symptoms to move closer to your back and eventually resolve. Sometimes, these leg symptoms will get better, but your lower back pain may worsen. This is often an indication of progress in your rehabilitation. Be very alert to any changes in your symptoms and the activities in which you participated in the 24 hours prior to the change. Sharing this information with your caregiver will allow him or her to most  efficiently treat your condition. These exercises may help you when beginning to rehabilitate your injury. Your symptoms may resolve with or without further involvement from your physician, physical therapist or athletic trainer. While completing these exercises, remember:   Restoring tissue flexibility helps normal motion to return to the joints. This allows healthier, less painful movement and activity.  An effective stretch should be held for at least 30 seconds.  A stretch should never be painful. You should only feel a gentle lengthening or release in the stretched tissue. FLEXION RANGE OF MOTION AND STRETCHING EXERCISES: STRETCH - Flexion, Single Knee to Chest   Lie on a firm bed or floor with both legs extended in front of you.  Keeping one leg in contact with the floor, bring your opposite knee to your chest. Hold your leg in place by either grabbing behind your thigh or at your knee.  Pull until you feel a gentle stretch in your low back. Hold __________ seconds.  Slowly release your grasp and repeat the exercise with the opposite side. Repeat __________ times. Complete this exercise __________ times per day.  STRETCH - Flexion, Double Knee to Chest  Lie on a firm bed or floor with both legs extended in front of you.  Keeping one leg in contact with the floor, bring your opposite knee to your chest.  Tense your stomach muscles to support your back and then lift your other knee to your chest. Hold your legs in   place by either grabbing behind your thighs or at your knees.  Pull both knees toward your chest until you feel a gentle stretch in your low back. Hold __________ seconds.  Tense your stomach muscles and slowly return one leg at a time to the floor. Repeat __________ times. Complete this exercise __________ times per day.  STRETCH - Low Trunk Rotation  Lie on a firm bed or floor. Keeping your legs in front of you, bend your knees so they are both pointed toward the  ceiling and your feet are flat on the floor.  Extend your arms out to the side. This will stabilize your upper body by keeping your shoulders in contact with the floor.  Gently and slowly drop both knees together to one side until you feel a gentle stretch in your low back. Hold for __________ seconds.  Tense your stomach muscles to support your lower back as you bring your knees back to the starting position. Repeat the exercise to the other side. Repeat __________ times. Complete this exercise __________ times per day  EXTENSION RANGE OF MOTION AND FLEXIBILITY EXERCISES: STRETCH - Extension, Prone on Elbows   Lie on your stomach on the floor, a bed will be too soft. Place your palms about shoulder width apart and at the height of your head.  Place your elbows under your shoulders. If this is too painful, stack pillows under your chest.  Allow your body to relax so that your hips drop lower and make contact more completely with the floor.  Hold this position for __________ seconds.  Slowly return to lying flat on the floor. Repeat __________ times. Complete this exercise __________ times per day.  RANGE OF MOTION - Extension, Prone Press Ups  Lie on your stomach on the floor, a bed will be too soft. Place your palms about shoulder width apart and at the height of your head.  Keeping your back as relaxed as possible, slowly straighten your elbows while keeping your hips on the floor. You may adjust the placement of your hands to maximize your comfort. As you gain motion, your hands will come more underneath your shoulders.  Hold this position __________ seconds.  Slowly return to lying flat on the floor. Repeat __________ times. Complete this exercise __________ times per day.  RANGE OF MOTION- Quadruped, Neutral Spine   Assume a hands and knees position on a firm surface. Keep your hands under your shoulders and your knees under your hips. You may place padding under your knees for  comfort.  Drop your head and point your tailbone toward the ground below you. This will round out your lower back like an angry cat. Hold this position for __________ seconds.  Slowly lift your head and release your tail bone so that your back sags into a large arch, like an old horse.  Hold this position for __________ seconds.  Repeat this until you feel limber in your low back.  Now, find your "sweet spot." This will be the most comfortable position somewhere between the two previous positions. This is your neutral spine. Once you have found this position, tense your stomach muscles to support your low back.  Hold this position for __________ seconds. Repeat __________ times. Complete this exercise __________ times per day.  STRENGTHENING EXERCISES - Low Back Sprain These exercises may help you when beginning to rehabilitate your injury. These exercises should be done near your "sweet spot." This is the neutral, low-back arch, somewhere between fully rounded and   fully arched, that is your least painful position. When performed in this safe range of motion, these exercises can be used for people who have either a flexion or extension based injury. These exercises may resolve your symptoms with or without further involvement from your physician, physical therapist or athletic trainer. While completing these exercises, remember:   Muscles can gain both the endurance and the strength needed for everyday activities through controlled exercises.  Complete these exercises as instructed by your physician, physical therapist or athletic trainer. Increase the resistance and repetitions only as guided.  You may experience muscle soreness or fatigue, but the pain or discomfort you are trying to eliminate should never worsen during these exercises. If this pain does worsen, stop and make certain you are following the directions exactly. If the pain is still present after adjustments, discontinue the  exercise until you can discuss the trouble with your caregiver. STRENGTHENING - Deep Abdominals, Pelvic Tilt   Lie on a firm bed or floor. Keeping your legs in front of you, bend your knees so they are both pointed toward the ceiling and your feet are flat on the floor.  Tense your lower abdominal muscles to press your low back into the floor. This motion will rotate your pelvis so that your tail bone is scooping upwards rather than pointing at your feet or into the floor. With a gentle tension and even breathing, hold this position for __________ seconds. Repeat __________ times. Complete this exercise __________ times per day.  STRENGTHENING - Abdominals, Crunches   Lie on a firm bed or floor. Keeping your legs in front of you, bend your knees so they are both pointed toward the ceiling and your feet are flat on the floor. Cross your arms over your chest.  Slightly tip your chin down without bending your neck.  Tense your abdominals and slowly lift your trunk high enough to just clear your shoulder blades. Lifting higher can put excessive stress on the lower back and does not further strengthen your abdominal muscles.  Control your return to the starting position. Repeat __________ times. Complete this exercise __________ times per day.  STRENGTHENING - Quadruped, Opposite UE/LE Lift   Assume a hands and knees position on a firm surface. Keep your hands under your shoulders and your knees under your hips. You may place padding under your knees for comfort.  Find your neutral spine and gently tense your abdominal muscles so that you can maintain this position. Your shoulders and hips should form a rectangle that is parallel with the floor and is not twisted.  Keeping your trunk steady, lift your right hand no higher than your shoulder and then your left leg no higher than your hip. Make sure you are not holding your breath. Hold this position for __________ seconds.  Continuing to keep  your abdominal muscles tense and your back steady, slowly return to your starting position. Repeat with the opposite arm and leg. Repeat __________ times. Complete this exercise __________ times per day.  STRENGTHENING - Abdominals and Quadriceps, Straight Leg Raise   Lie on a firm bed or floor with both legs extended in front of you.  Keeping one leg in contact with the floor, bend the other knee so that your foot can rest flat on the floor.  Find your neutral spine, and tense your abdominal muscles to maintain your spinal position throughout the exercise.  Slowly lift your straight leg off the floor about 6 inches for a count of   15, making sure to not hold your breath.  Still keeping your neutral spine, slowly lower your leg all the way to the floor. Repeat this exercise with each leg __________ times. Complete this exercise __________ times per day. POSTURE AND BODY MECHANICS CONSIDERATIONS - Low Back Sprain Keeping correct posture when sitting, standing or completing your activities will reduce the stress put on different body tissues, allowing injured tissues a chance to heal and limiting painful experiences. The following are general guidelines for improved posture. Your physician or physical therapist will provide you with any instructions specific to your needs. While reading these guidelines, remember:  The exercises prescribed by your provider will help you have the flexibility and strength to maintain correct postures.  The correct posture provides the best environment for your joints to work. All of your joints have less wear and tear when properly supported by a spine with good posture. This means you will experience a healthier, less painful body.  Correct posture must be practiced with all of your activities, especially prolonged sitting and standing. Correct posture is as important when doing repetitive low-stress activities (typing) as it is when doing a single heavy-load  activity (lifting). RESTING POSITIONS Consider which positions are most painful for you when choosing a resting position. If you have pain with flexion-based activities (sitting, bending, stooping, squatting), choose a position that allows you to rest in a less flexed posture. You would want to avoid curling into a fetal position on your side. If your pain worsens with extension-based activities (prolonged standing, working overhead), avoid resting in an extended position such as sleeping on your stomach. Most people will find more comfort when they rest with their spine in a more neutral position, neither too rounded nor too arched. Lying on a non-sagging bed on your side with a pillow between your knees, or on your back with a pillow under your knees will often provide some relief. Keep in mind, being in any one position for a prolonged period of time, no matter how correct your posture, can still lead to stiffness. PROPER SITTING POSTURE In order to minimize stress and discomfort on your spine, you must sit with correct posture. Sitting with good posture should be effortless for a healthy body. Returning to good posture is a gradual process. Many people can work toward this most comfortably by using various supports until they have the flexibility and strength to maintain this posture on their own. When sitting with proper posture, your ears will fall over your shoulders and your shoulders will fall over your hips. You should use the back of the chair to support your upper back. Your lower back will be in a neutral position, just slightly arched. You may place a small pillow or folded towel at the base of your lower back for  support.  When working at a desk, create an environment that supports good, upright posture. Without extra support, muscles tire, which leads to excessive strain on joints and other tissues. Keep these recommendations in mind: CHAIR:  A chair should be able to slide under your desk  when your back makes contact with the back of the chair. This allows you to work closely.  The chair's height should allow your eyes to be level with the upper part of your monitor and your hands to be slightly lower than your elbows. BODY POSITION  Your feet should make contact with the floor. If this is not possible, use a foot rest.  Keep your ears   over your shoulders. This will reduce stress on your neck and low back. INCORRECT SITTING POSTURES  If you are feeling tired and unable to assume a healthy sitting posture, do not slouch or slump. This puts excessive strain on your back tissues, causing more damage and pain. Healthier options include:  Using more support, like a lumbar pillow.  Switching tasks to something that requires you to be upright or walking.  Talking a brief walk.  Lying down to rest in a neutral-spine position. PROLONGED STANDING WHILE SLIGHTLY LEANING FORWARD  When completing a task that requires you to lean forward while standing in one place for a long time, place either foot up on a stationary 2-4 inch high object to help maintain the best posture. When both feet are on the ground, the lower back tends to lose its slight inward curve. If this curve flattens (or becomes too large), then the back and your other joints will experience too much stress, tire more quickly, and can cause pain. CORRECT STANDING POSTURES Proper standing posture should be assumed with all daily activities, even if they only take a few moments, like when brushing your teeth. As in sitting, your ears should fall over your shoulders and your shoulders should fall over your hips. You should keep a slight tension in your abdominal muscles to brace your spine. Your tailbone should point down to the ground, not behind your body, resulting in an over-extended swayback posture.  INCORRECT STANDING POSTURES  Common incorrect standing postures include a forward head, locked knees and/or an excessive  swayback. WALKING Walk with an upright posture. Your ears, shoulders and hips should all line-up. PROLONGED ACTIVITY IN A FLEXED POSITION When completing a task that requires you to bend forward at your waist or lean over a low surface, try to find a way to stabilize 3 out of 4 of your limbs. You can place a hand or elbow on your thigh or rest a knee on the surface you are reaching across. This will provide you more stability, so that your muscles do not tire as quickly. By keeping your knees relaxed, or slightly bent, you will also reduce stress across your lower back. CORRECT LIFTING TECHNIQUES DO :  Assume a wide stance. This will provide you more stability and the opportunity to get as close as possible to the object which you are lifting.  Tense your abdominals to brace your spine. Bend at the knees and hips. Keeping your back locked in a neutral-spine position, lift using your leg muscles. Lift with your legs, keeping your back straight.  Test the weight of unknown objects before attempting to lift them.  Try to keep your elbows locked down at your sides in order get the best strength from your shoulders when carrying an object.  Always ask for help when lifting heavy or awkward objects. INCORRECT LIFTING TECHNIQUES DO NOT:   Lock your knees when lifting, even if it is a small object.  Bend and twist. Pivot at your feet or move your feet when needing to change directions.  Assume that you can safely pick up even a paperclip without proper posture.   This information is not intended to replace advice given to you by your health care provider. Make sure you discuss any questions you have with your health care provider.   Document Released: 11/29/2005 Document Revised: 12/20/2014 Document Reviewed: 03/13/2009 [employer] Interactive Patient Education 2016 [employer]  

## 2016-07-28 NOTE — Progress Notes (Signed)
   Subjective:    Patient ID: Angela Oliver, female    DOB: 01/01/1966, 50 y.o.   MRN: [id number]  HPI  Patient is a 50 year old female who presents to the clinic with low back pain more to the right than the left. Symptoms started about 5 days ago after bending down to pick something up and her back straining. She ended up going to urgent care on 07/25/2015. No imaging was done. She was given a shot of Toradol and a shot of Solu-Medrol. She did get some improvement(about 30 percent)  that she is still struggling with pain with movement. She denies any saddle anesthia, radiation of pain down legs, or bowel or bladder dysfunction. 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 Historically she has had back strains and use Flexeril. They gave her Zanaflex in urgent care but it only made her sleepy and did not help with muscles.   Review of Systems    see history of present illness Objective:   Physical Exam  Constitutional: She is oriented to person, place, and time. She appears well-developed and well-nourished.  HENT:  Head: Normocephalic and atraumatic.  Cardiovascular: Normal rate, regular rhythm and normal heart sounds.   Pulmonary/Chest: Effort normal and breath sounds normal.  Musculoskeletal:  ROM at waist limited with flexion due to pain.  Twisting was without pain.  Pain over lumbar spine and paraspinous muscles to the right.  Negative straight leg test.   Neurological: She is alert and oriented to person, place, and time.  Psychiatric: She has a normal mood and affect. Her behavior is normal.          Assessment & Plan:  Low back sprain/acute right-sided low back pain without sciatica-will prescribe prednisone pack. Discussed exercises. Encouraged patient to heat back and work on exercises. Exercise given and PDF a form. Encouraged biofreeze and icing. If not improving may need PT. Advised due to sedation side effects not to take any  flexeril/xanaflex so that she may work.

## 2016-07-30 ENCOUNTER — Other Ambulatory Visit: Payer: Self-pay | Admitting: *Deleted

## 2016-07-30 DIAGNOSIS — H1045 Other chronic allergic conjunctivitis: Secondary | ICD-10-CM | POA: Diagnosis not present

## 2016-08-09 ENCOUNTER — Other Ambulatory Visit: Payer: Self-pay | Admitting: Sports Medicine

## 2016-08-10 ENCOUNTER — Encounter: Payer: Self-pay | Admitting: *Deleted

## 2016-08-27 ENCOUNTER — Other Ambulatory Visit: Payer: Self-pay | Admitting: Physician Assistant

## 2016-09-05 ENCOUNTER — Other Ambulatory Visit: Payer: Self-pay | Admitting: Sports Medicine

## 2016-09-28 ENCOUNTER — Other Ambulatory Visit: Payer: Self-pay | Admitting: Physician Assistant

## 2016-12-03 ENCOUNTER — Other Ambulatory Visit: Payer: Self-pay | Admitting: Physician Assistant

## 2016-12-28 ENCOUNTER — Encounter: Payer: Self-pay | Admitting: Physician Assistant

## 2016-12-28 ENCOUNTER — Ambulatory Visit (INDEPENDENT_AMBULATORY_CARE_PROVIDER_SITE_OTHER): Payer: Federal, State, Local not specified - PPO | Admitting: Physician Assistant

## 2016-12-28 VITALS — BP 134/81 | HR 80 | Ht 64.0 in | Wt 195.0 lb

## 2016-12-28 DIAGNOSIS — G43809 Other migraine, not intractable, without status migrainosus: Secondary | ICD-10-CM

## 2016-12-28 DIAGNOSIS — G43711 Chronic migraine without aura, intractable, with status migrainosus: Secondary | ICD-10-CM | POA: Diagnosis not present

## 2016-12-28 DIAGNOSIS — Z23 Encounter for immunization: Secondary | ICD-10-CM

## 2016-12-28 MED ORDER — KETOROLAC TROMETHAMINE 30 MG/ML IJ SOLN
30.0000 mg | Freq: Once | INTRAMUSCULAR | Status: AC
  Administered 2016-12-28: 30 mg via INTRAMUSCULAR

## 2016-12-28 NOTE — Progress Notes (Signed)
   Subjective:    Patient ID: Angela Oliver, female    DOB: 02/22/1966, 51 y.o.   MRN: [id number]  HPI Pt is a 51 yo female who presents to the clinic to get FMLA paperwork filled out for migraines. She is having 1 migraine about every 2 weeks that last 2-3 days. She is taking propranolol 20mg  twice a day but when they are bad she takes 40mg . She takes excedrin migraine for rescue and does help. She has a mild migraine today.    Review of Systems  All other systems reviewed and are negative.      Objective:   Physical Exam  Constitutional: She is oriented to person, place, and time. She appears well-developed and well-nourished.  HENT:  Head: Normocephalic and atraumatic.  Cardiovascular: Normal rate, regular rhythm and normal heart sounds.   Pulmonary/Chest: Effort normal and breath sounds normal.  Neurological: She is alert and oriented to person, place, and time.  Psychiatric: She has a normal mood and affect. Her behavior is normal.          Assessment & Plan:  Marland Kitchen.Marland Kitchen.Diagnoses and all orders for this visit:  Intractable chronic migraine without aura and with status migrainosus -     ketorolac (TORADOL) 30 MG/ML injection 30 mg; Inject 1 mL (30 mg total) into the muscle once.  Influenza vaccine needed -     Flu Vaccine QUAD 36+ mos PF IM (Fluarix & Fluzone Quad PF)  Other migraine without status migrainosus, not intractable -     ketorolac (TORADOL) 30 MG/ML injection 30 mg; Inject 1 mL (30 mg total) into the muscle once.   FMLA paperwork filled out. Encouraged patient to take propranolol 40mg  twice a day.

## 2016-12-31 ENCOUNTER — Ambulatory Visit: Payer: Federal, State, Local not specified - PPO | Admitting: Physician Assistant

## 2016-12-31 ENCOUNTER — Ambulatory Visit (INDEPENDENT_AMBULATORY_CARE_PROVIDER_SITE_OTHER): Payer: Federal, State, Local not specified - PPO | Admitting: Physician Assistant

## 2016-12-31 ENCOUNTER — Encounter: Payer: Self-pay | Admitting: Physician Assistant

## 2016-12-31 VITALS — BP 148/85 | HR 80 | Temp 98.0°F | Ht 64.0 in | Wt 195.0 lb

## 2016-12-31 DIAGNOSIS — J209 Acute bronchitis, unspecified: Secondary | ICD-10-CM | POA: Diagnosis not present

## 2016-12-31 DIAGNOSIS — J069 Acute upper respiratory infection, unspecified: Secondary | ICD-10-CM | POA: Diagnosis not present

## 2016-12-31 MED ORDER — PREDNISONE 50 MG PO TABS: ORAL_TABLET | ORAL | 0 refills | Status: DC

## 2016-12-31 MED ORDER — PROMETHAZINE-DM 6.25-15 MG/5ML PO SYRP: 5.0000 mL | ORAL_SOLUTION | Freq: Four times a day (QID) | ORAL | 0 refills | Status: DC | PRN

## 2016-12-31 NOTE — Progress Notes (Signed)
   Subjective:    Patient ID: Angela Oliver, female    DOB: 11/17/1966, 51 y.o.   MRN: [id number]  HPI  Pt is a 51 yo female who presents to the clinic with 1 day of dry cough, fatigue, chest tightness, and SOB. Her ear also feel "clogged".Her boyfriend had flu about 1 week ago that developed into pneumonia. She is using proair and helping. She is not taking any OTC medications. No fever, body aches, or sinus pressure.       Review of Systems    see HPI.  Objective:   Physical Exam  Constitutional: She is oriented to person, place, and time. She appears well-developed and well-nourished.  HENT:  Head: Normocephalic and atraumatic.  Right Ear: External ear normal.  Left Ear: External ear normal.  Nose: Nose normal.  Mouth/Throat: Oropharynx is clear and moist. No oropharyngeal exudate.  TM's clear bilaterally.   Eyes: Conjunctivae are normal. Right eye exhibits no discharge. Left eye exhibits no discharge.  Neck: Normal range of motion. Neck supple.  Tenderness bilateral anterior cervical lymphnodes.   Cardiovascular: Normal rate, regular rhythm and normal heart sounds.   Pulmonary/Chest: Effort normal and breath sounds normal. She has no wheezes.  Lymphadenopathy:    She has cervical adenopathy.  Neurological: She is alert and oriented to person, place, and time.  Skin: Skin is dry.  Psychiatric: She has a normal mood and affect. Her behavior is normal.          Assessment & Plan:  Marland Kitchen.Marland Kitchen.Angela Oliver was seen today for cough and asthma.  Diagnoses and all orders for this visit:  Acute upper respiratory infection -     predniSONE (DELTASONE) 50 MG tablet; Take one tablet for 5 days. -     promethazine-dextromethorphan (PROMETHAZINE-DM) 6.25-15 MG/5ML syrup; Take 5 mLs by mouth 4 (four) times daily as needed for cough.  Acute bronchitis, unspecified organism -     predniSONE (DELTASONE) 50 MG tablet; Take one tablet for 5 days. -     promethazine-dextromethorphan (PROMETHAZINE-DM)  6.25-15 MG/5ML syrup; Take 5 mLs by mouth 4 (four) times daily as needed for cough.   Discussed symptomatic care. I do not think flu due to no fever or body aches.  Cough syrup/prednisone given.  Reassured no signs of bacterial infection.  Follow up as needed.

## 2016-12-31 NOTE — Patient Instructions (Addendum)
Tylenol cold/sinus/severe- every 4-6 hours Prednisone for 5 days.  Cough syrup for cough.  Continue with proair 2-4 hours as needed.  Zinc lozengers.     Upper Respiratory Infection, Adult Most upper respiratory infections (URIs) are caused by a virus. A URI affects the nose, throat, and upper air passages. The most common type of URI is often called "the common cold." Follow these instructions at home:  Take medicines only as told by your doctor.  Gargle warm saltwater or take cough drops to comfort your throat as told by your doctor.  Use a warm mist humidifier or inhale steam from a shower to increase air moisture. This may make it easier to breathe.  Drink enough fluid to keep your pee (urine) clear or pale yellow.  Eat soups and other clear broths.  Have a healthy diet.  Rest as needed.  Go back to work when your fever is gone or your doctor says it is okay.  You may need to stay home longer to avoid giving your URI to others.  You can also wear a face mask and wash your hands often to prevent spread of the virus.  Use your inhaler more if you have asthma.  Do not use any tobacco products, including cigarettes, chewing tobacco, or electronic cigarettes. If you need help quitting, ask your doctor. Contact a doctor if:  You are getting worse, not better.  Your symptoms are not helped by medicine.  You have chills.  You are getting more short of breath.  You have brown or red mucus.  You have yellow or brown discharge from your nose.  You have pain in your face, especially when you bend forward.  You have a fever.  You have puffy (swollen) neck glands.  You have pain while swallowing.  You have white areas in the back of your throat. Get help right away if:  You have very bad or constant:  Headache.  Ear pain.  Pain in your forehead, behind your eyes, and over your cheekbones (sinus pain).  Chest pain.  You have long-lasting (chronic) lung  disease and any of the following:  Wheezing.  Long-lasting cough.  Coughing up blood.  A change in your usual mucus.  You have a stiff neck.  You have changes in your:  Vision.  Hearing.  Thinking.  Mood. This information is not intended to replace advice given to you by your health care provider. Make sure you discuss any questions you have with your health care provider. Document Released: 05/17/2008 Document Revised: 08/01/2016 Document Reviewed: 03/06/2014 Elsevier Interactive Patient Education  2017 [employer]

## 2017-01-03 ENCOUNTER — Other Ambulatory Visit: Payer: Self-pay | Admitting: *Deleted

## 2017-01-03 MED ORDER — AZITHROMYCIN 250 MG PO TABS: ORAL_TABLET | ORAL | 0 refills | Status: DC

## 2017-01-25 ENCOUNTER — Ambulatory Visit (INDEPENDENT_AMBULATORY_CARE_PROVIDER_SITE_OTHER): Payer: Federal, State, Local not specified - PPO | Admitting: Physician Assistant

## 2017-01-25 ENCOUNTER — Encounter: Payer: Self-pay | Admitting: Physician Assistant

## 2017-01-25 VITALS — BP 117/79 | HR 70 | Temp 98.3°F | Ht 62.75 in | Wt 197.4 lb

## 2017-01-25 DIAGNOSIS — E669 Obesity, unspecified: Secondary | ICD-10-CM | POA: Diagnosis not present

## 2017-01-25 DIAGNOSIS — J181 Lobar pneumonia, unspecified organism: Secondary | ICD-10-CM | POA: Diagnosis not present

## 2017-01-25 DIAGNOSIS — J189 Pneumonia, unspecified organism: Secondary | ICD-10-CM

## 2017-01-25 MED ORDER — DOXYCYCLINE HYCLATE 100 MG PO TABS: 100.0000 mg | ORAL_TABLET | Freq: Two times a day (BID) | ORAL | 0 refills | Status: DC

## 2017-01-25 MED ORDER — NALTREXONE-BUPROPION HCL ER 8-90 MG PO TB12: ORAL_TABLET | ORAL | 0 refills | Status: DC

## 2017-01-25 MED ORDER — METHYLPREDNISOLONE SODIUM SUCC 125 MG IJ SOLR
125.0000 mg | Freq: Once | INTRAMUSCULAR | Status: AC
  Administered 2017-01-25: 125 mg via INTRAMUSCULAR

## 2017-01-25 MED ORDER — BENZONATATE 200 MG PO CAPS: 200.0000 mg | ORAL_CAPSULE | Freq: Two times a day (BID) | ORAL | 0 refills | Status: DC | PRN

## 2017-01-25 NOTE — Progress Notes (Signed)
   Subjective:    Patient ID: Angela Oliver, female    DOB: 03/27/1966, 51 y.o.   MRN: [id number]  HPI  Pt is a 51 yo female who presents to the clinic with ongoing cough, fatigue, SOB, right sided chest painfor over 2 weeks. She was seen for URI on 1/19 and given prednisone and cough syrup. She did seem to get some better but then got worse. She has ran low grade fevers. Deep breathing is painful. She is taking OTC cold medications with little relief.   She comes in requesting contrave. She saw a commerical. She wants to lose weight in the spring once she starts feeling better. She is not currently exercising or eating right. She has not tried any weight loss medications.   Review of Systems See HPI>     Objective:   Physical Exam  Constitutional: She is oriented to person, place, and time. She appears well-developed and well-nourished.  HENT:  Head: Normocephalic and atraumatic.  Right Ear: External ear normal.  Left Ear: External ear normal.  Nose: Nose normal.  Mouth/Throat: Oropharynx is clear and moist.  Eyes: Conjunctivae are normal.  Neck: Normal range of motion. Neck supple.  Cardiovascular: Normal rate, regular rhythm and normal heart sounds.   Pulmonary/Chest:  Decreased effort.  Lower/middle right lung crackles. No wheezing.   Lymphadenopathy:    She has no cervical adenopathy.  Neurological: She is alert and oriented to person, place, and time.  Skin: Skin is warm.  Pale and clamy.           Assessment & Plan:  Marland Kitchen.Marland Kitchen.Diagnoses and all orders for this visit:  Community acquired pneumonia of right lower lobe of lung (HCC) -     doxycycline (VIBRA-TABS) 100 MG tablet; Take 1 tablet (100 mg total) by mouth 2 (two) times daily. For 10 days. -     benzonatate (TESSALON) 200 MG capsule; Take 1 capsule (200 mg total) by mouth 2 (two) times daily as needed for cough. -     methylPREDNISolone sodium succinate (SOLU-MEDROL) 125 mg/2 mL injection 125 mg; Inject 2 mLs (125 mg  total) into the muscle once.  Obesity (BMI 30.0-34.9) -     Naltrexone-Bupropion HCl ER 8-90 MG TB12; Take 1 tablet in am for 1 week, then increase to 1 tablet in am and 1 tablet in pm for 1 week, then increase to 2 tablets in am and 1 tablet in pm for 1 week, then increase to 2 tablets in am and 2 tablets in pm daily.   HO given with other symptomatic care. Follow up if symptoms worsen.   Discussed weight loss plan. Discussed 1500 calorie diet and 150 minutes of exercise a week.

## 2017-01-25 NOTE — Patient Instructions (Signed)
Community-Acquired Pneumonia, Adult °Introduction °Pneumonia is an infection of the lungs. One type of pneumonia can happen while a person is in a hospital. A different type can happen when a person is not in a hospital (community-acquired pneumonia). It is easy for this kind to spread from person to person. It can spread to you if you breathe near an infected person who coughs or sneezes. Some symptoms include: °· A dry cough. °· A wet (productive) cough. °· Fever. °· Sweating. °· Chest pain. °Follow these instructions at home: °· Take over-the-counter and prescription medicines only as told by your doctor. °¨ Only take cough medicine if you are losing sleep. °¨ If you were prescribed an antibiotic medicine, take it as told by your doctor. Do not stop taking the antibiotic even if you start to feel better. °· Sleep with your head and neck raised (elevated). You can do this by putting a few pillows under your head, or you can sleep in a recliner. °· Do not use tobacco products. These include cigarettes, chewing tobacco, and e-cigarettes. If you need help quitting, ask your doctor. °· Drink enough water to keep your pee (urine) clear or pale yellow. °A shot (vaccine) can help prevent pneumonia. Shots are often suggested for: °· People older than 53 years of age. °· People older than 53 years of age: °¨ Who are having cancer treatment. °¨ Who have long-term (chronic) lung disease. °¨ Who have problems with their body's defense system (immune system). °You may also prevent pneumonia if you take these actions: °· Get the flu (influenza) shot every year. °· Go to the dentist as often as told. °· Wash your hands often. If soap and water are not available, use hand sanitizer. °Contact a doctor if: °· You have a fever. °· You lose sleep because your cough medicine does not help. °Get help right away if: °· You are short of breath and it gets worse. °· You have more chest pain. °· Your sickness gets worse. This is very  serious if: °¨ You are an older adult. °¨ Your body's defense system is weak. °· You cough up blood. °This information is not intended to replace advice given to you by your health care provider. Make sure you discuss any questions you have with your health care provider. °Document Released: 05/17/2008 Document Revised: 05/06/2016 Document Reviewed: 03/26/2015 °© 2017 Elsevier ° °

## 2017-01-27 DIAGNOSIS — E669 Obesity, unspecified: Secondary | ICD-10-CM | POA: Insufficient documentation

## 2017-02-01 ENCOUNTER — Ambulatory Visit (INDEPENDENT_AMBULATORY_CARE_PROVIDER_SITE_OTHER): Payer: Federal, State, Local not specified - PPO | Admitting: Physician Assistant

## 2017-02-01 ENCOUNTER — Encounter: Payer: Self-pay | Admitting: Physician Assistant

## 2017-02-01 VITALS — BP 123/79 | HR 74 | Ht 62.75 in | Wt 197.0 lb

## 2017-02-01 DIAGNOSIS — G43711 Chronic migraine without aura, intractable, with status migrainosus: Secondary | ICD-10-CM

## 2017-02-01 NOTE — Progress Notes (Signed)
   Subjective:    Patient ID: Angela Oliver, female    DOB: 06/16/1966, 51 y.o.   MRN: [id number]  HPI  Pt is a 51 yo female who presents to the clinic to fill out paperwork for work. She has FMLA for as needed migraines to miss work twice a month for no more than 2 days. Pt is doing really well on propranolol for prevention.    Review of Systems  All other systems reviewed and are negative.      Objective:   Physical Exam  Constitutional: She is oriented to person, place, and time. She appears well-developed and well-nourished.  HENT:  Head: Normocephalic and atraumatic.  Cardiovascular: Normal rate, regular rhythm and normal heart sounds.   Pulmonary/Chest: Effort normal and breath sounds normal.  Neurological: She is alert and oriented to person, place, and time.  Psychiatric: She has a normal mood and affect. Her behavior is normal.          Assessment & Plan:  Marland Kitchen.Marland Kitchen.Diagnoses and all orders for this visit:  Intractable chronic migraine without aura and with status migrainosus   Filled out paperwork and scanned into chart.

## 2017-02-06 ENCOUNTER — Other Ambulatory Visit: Payer: Self-pay | Admitting: Family Medicine

## 2017-02-11 ENCOUNTER — Other Ambulatory Visit: Payer: Self-pay | Admitting: Physician Assistant

## 2017-02-11 NOTE — Telephone Encounter (Signed)
Pt is no longer on this medication

## 2017-02-11 NOTE — Telephone Encounter (Signed)
This medication was last filled in August.  Please advise as to a refill.

## 2017-02-14 ENCOUNTER — Telehealth: Payer: Self-pay | Admitting: *Deleted

## 2017-02-14 ENCOUNTER — Other Ambulatory Visit: Payer: Self-pay | Admitting: *Deleted

## 2017-02-14 MED ORDER — SERTRALINE HCL 50 MG PO TABS: 50.0000 mg | ORAL_TABLET | Freq: Every day | ORAL | 1 refills | Status: DC

## 2017-02-14 NOTE — Telephone Encounter (Signed)
LMOM notifying pt of rx & to f/u in 6 weeks.

## 2017-02-14 NOTE — Telephone Encounter (Signed)
Pt left vm asking for a refill of Sertraline.  She stated that she's been off of it for a while now.  Please advise.

## 2017-02-14 NOTE — Telephone Encounter (Signed)
Ok to restart at 50mg  daily. Ok to send. She will need a follow up in 6 weeks for mood.

## 2017-03-29 ENCOUNTER — Ambulatory Visit (INDEPENDENT_AMBULATORY_CARE_PROVIDER_SITE_OTHER): Payer: Federal, State, Local not specified - PPO | Admitting: Physician Assistant

## 2017-03-29 VITALS — BP 122/73 | HR 57 | Wt 202.0 lb

## 2017-03-29 DIAGNOSIS — G43709 Chronic migraine without aura, not intractable, without status migrainosus: Secondary | ICD-10-CM | POA: Diagnosis not present

## 2017-03-29 MED ORDER — PROMETHAZINE HCL 25 MG PO TABS: 12.5000 mg | ORAL_TABLET | Freq: Every evening | ORAL | 2 refills | Status: DC | PRN

## 2017-03-29 MED ORDER — KETOROLAC TROMETHAMINE 30 MG/ML IJ SOLN
30.0000 mg | Freq: Once | INTRAMUSCULAR | Status: AC
  Administered 2017-03-29: 30 mg via INTRAMUSCULAR

## 2017-03-29 MED ORDER — DEXAMETHASONE SODIUM PHOSPHATE 4 MG/ML IJ SOLN
4.0000 mg | Freq: Once | INTRAMUSCULAR | Status: AC
  Administered 2017-03-29: 4 mg via INTRAMUSCULAR

## 2017-03-29 NOTE — Patient Instructions (Signed)
- Phenergan 1/2 - 1 tablet as needed at bedtime for headache (will cause drowsiness)   Migraine Headache A migraine headache is an intense, throbbing pain on one side or both sides of the head. Migraines may also cause other symptoms, such as nausea, vomiting, and sensitivity to light and noise. What are the causes? Doing or taking certain things may also trigger migraines, such as:  Alcohol.  Smoking.  Medicines, such as:  Medicine used to treat chest pain (nitroglycerine).  Birth control pills.  Estrogen pills.  Certain blood pressure medicines.  Aged cheeses, chocolate, or caffeine.  Foods or drinks that contain nitrates, glutamate, aspartame, or tyramine.  Physical activity. Other things that may trigger a migraine include:  Menstruation.  Pregnancy.  Hunger.  Stress, lack of sleep, too much sleep, or fatigue.  Weather changes. What increases the risk? The following factors may make you more likely to experience migraine headaches:  Age. Risk increases with age.  Family history of migraine headaches.  Being Caucasian.  Depression and anxiety.  Obesity.  Being a woman.  Having a hole in the heart (patent foramen ovale) or other heart problems. What are the signs or symptoms? The main symptom of this condition is pulsating or throbbing pain. Pain may:  Happen in any area of the head, such as on one side or both sides.  Interfere with daily activities.  Get worse with physical activity.  Get worse with exposure to bright lights or loud noises. Other symptoms may include:  Nausea.  Vomiting.  Dizziness.  General sensitivity to bright lights, loud noises, or smells. Before you get a migraine, you may get warning signs that a migraine is developing (aura). An aura may include:  Seeing flashing lights or having blind spots.  Seeing bright spots, halos, or zigzag lines.  Having tunnel vision or blurred vision.  Having numbness or a tingling  feeling.  Having trouble talking.  Having muscle weakness. How is this diagnosed? A migraine headache can be diagnosed based on:  Your symptoms.  A physical exam.  Tests, such as CT scan or MRI of the head. These imaging tests can help rule out other causes of headaches.  Taking fluid from the spine (lumbar puncture) and analyzing it (cerebrospinal fluid analysis, or CSF analysis). How is this treated? A migraine headache is usually treated with medicines that:  Relieve pain.  Relieve nausea.  Prevent migraines from coming back. Treatment may also include:  Acupuncture.  Lifestyle changes like avoiding foods that trigger migraines. Follow these instructions at home: Medicines   Take over-the-counter and prescription medicines only as told by your health care provider.  Do not drive or use heavy machinery while taking prescription pain medicine.  To prevent or treat constipation while you are taking prescription pain medicine, your health care provider may recommend that you:  Drink enough fluid to keep your urine clear or pale yellow.  Take over-the-counter or prescription medicines.  Eat foods that are high in fiber, such as fresh fruits and vegetables, whole grains, and beans.  Limit foods that are high in fat and processed sugars, such as fried and sweet foods. Lifestyle   Avoid alcohol use.  Do not use any products that contain nicotine or tobacco, such as cigarettes and e-cigarettes. If you need help quitting, ask your health care provider.  Get at least 8 hours of sleep every night.  Limit your stress. General instructions    Keep a journal to find out what may trigger your  migraine headaches. For example, write down:  What you eat and drink.  How much sleep you get.  Any change to your diet or medicines.  If you have a migraine:  Avoid things that make your symptoms worse, such as bright lights.  It may help to lie down in a dark, quiet  room.  Do not drive or use heavy machinery.  Ask your health care provider what activities are safe for you while you are experiencing symptoms.  Keep all follow-up visits as told by your health care provider. This is important. Contact a health care provider if:  You develop symptoms that are different or more severe than your usual migraine symptoms. Get help right away if:  Your migraine becomes severe.  You have a fever.  You have a stiff neck.  You have vision loss.  Your muscles feel weak or like you cannot control them.  You start to lose your balance often.  You develop trouble walking.  You faint. This information is not intended to replace advice given to you by your health care provider. Make sure you discuss any questions you have with your health care provider. Document Released: 11/29/2005 Document Revised: 06/18/2016 Document Reviewed: 05/17/2016 Elsevier Interactive Patient Education  2017 [employer].

## 2017-03-29 NOTE — Progress Notes (Signed)
HPI:                                                                Angela Oliver is a 51 y.o. female who presents to [name] today for headache  Patient with history of chronic migraine reports headache x 3 days. She is on Propranolol daily, which she says has been working well for her. 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 Unfortunately she still is having about 1 headache per month. She states she has been taking Excedrin migraine with some relief, but unfortunately headache keep returning. Headache is left-sided and temporal. Pain is moderate and persistent. Endorses photophobia and phonophobia. Denies nausea and vomiting.  States symptoms are consistent with her typical migraines.    Past Medical History:  Diagnosis Date  . Anxiety   . Arthritis   . Asthma   . Complication of anesthesia    slow to awaken  . Family history of adverse reaction to anesthesia    mother slow to awaken  . GERD (gastroesophageal reflux disease)   . Herpes simplex   . Irregular heart beat    in the past- no palpations- "skipped a beat"  . Migraines   . Seasonal allergies    Past Surgical History:  Procedure Laterality Date  . APPENDECTOMY    . CHOLECYSTECTOMY  06/01/2016   laproscopic   . CHOLECYSTECTOMY N/A 06/01/2016   Procedure: LAPAROSCOPIC CHOLECYSTECTOMY WITH INTRAOPERATIVE CHOLANGIOGRAM, REMOVAL PERITONEAL NODULE ADJACENT TO SIGMOID COLON;  Surgeon: Avel Peace, MD;  Location: [facility] OR;  Service: General;  Laterality: N/A;  . COLONOSCOPY     age 31  . OVARIAN CYST REMOVAL Left    part of ovary- cyst hadf ruptured   Social History  Substance Use Topics  . Smoking status: Never Smoker  . Smokeless tobacco: Never Used  . Alcohol use No   family history includes Alcohol abuse in her father; Asthma in her other; Cancer in her mother; Diabetes in her father; Hyperlipidemia in her father.  ROS: negative except as  noted in the HPI  Medications: Current Outpatient Prescriptions  Medication Sig Dispense Refill  . acyclovir (ZOVIRAX) 400 MG tablet TAKE 1 TABLET BY MOUTH 2 TIMES DAILY. 60 tablet 0  . Albuterol Sulfate (PROAIR RESPICLICK) 108 (90 BASE) MCG/ACT AEPB Inhale 2 puffs into the lungs every 4 (four) hours as needed. (Patient taking differently: Inhale 2 puffs into the lungs every 4 (four) hours as needed (shortness of breath/ wheezing). ) 1 each 2  . cetirizine (ZYRTEC) 10 MG tablet Take 10 mg by mouth daily.    . meloxicam (MOBIC) 15 MG tablet TAKE 1 TO 2 TABLETS BY MOUTH EVERY DAY FOR 2 WEEKS THEN AS NEEDED FOR JOINT PAIN 90 tablet 2  . montelukast (SINGULAIR) 10 MG tablet TAKE 1 TABLET (10 MG TOTAL) BY MOUTH DAILY. (Patient taking differently: Take 10 mg by mouth daily. ) 90 tablet 3  . omeprazole (PRILOSEC) 40 MG capsule TAKE 1 CAPSULE (40 MG TOTAL) BY MOUTH DAILY. 30 capsule 1  . propranolol (INDERAL) 40 MG tablet Take 1 tablet (40 mg total) by mouth 2 (two) times daily. 60 tablet 3  . sertraline (ZOLOFT) 50 MG tablet Take 1 tablet (  50 mg total) by mouth daily. 30 tablet 1   No current facility-administered medications for this visit.    Allergies  Allergen Reactions  . Sulfa Antibiotics Hives, Shortness Of Breath, Swelling and Rash    Throat swelling  . Sulfonamide Derivatives Hives, Shortness Of Breath, Swelling and Rash    Throat swelling  . Topamax [Topiramate] Other (See Comments)    Severe stomach pain       Objective:  BP 122/73   Pulse (!) 57   Wt 202 lb (91.6 kg)   BMI 36.07 kg/m  Gen: well-groomed, not ill-appearing, no acute distress HEENT: head normocephalic, atraumatic; normal conjunctiva, TM's clear, oropharynx clear, moist mucus membranes Pulm: Normal work of breathing, normal phonation, clear to auscultation bilaterally CV: Normal rate, regular rhythm, s1 and s2 distinct, no murmurs, clicks or rubs Neuro:  cranial nerves II-XII intact, no nystagmus, no  papilledema, normal finger-to-nose, normal heel-to-shin, negative pronator drift, normal coordination, DTR's intact, normal tone, no tremor MSK: strength 5/5 and symmetric in bilateral upper and lower extremities, normal gait and station Mental Status: alert and oriented x 3, normal speech, cooperative, organized thought content     No results found for this or any previous visit (from the past 72 hour(s)). No results found.    Assessment and Plan: 51 y.o. female with   1. Chronic migraine without aura without status migrainosus, not intractable - reassuring neurologic exam. No focal symptoms or signs on exams. Documented history of migraine - abortive therapy given in office - provided with phenergan to take prn at bedtime for at home abortive therapy since patient cannot do triptans - ketorolac (TORADOL) 30 MG/ML injection 30 mg; Inject 1 mL (30 mg total) into the muscle once. - dexamethasone (DECADRON) injection 4 mg; Inject 1 mL (4 mg total) into the muscle once. - promethazine (PHENERGAN) 25 MG tablet; Take 0.5 tablets (12.5 mg total) by mouth at bedtime as needed (migraine).  Dispense: 30 tablet; Refill: 2   Patient education and anticipatory guidance given Patient agrees with treatment plan Follow-up as needed if symptoms worsen or fail to improve  Levonne Hubert PA-C

## 2017-03-31 ENCOUNTER — Encounter: Payer: Federal, State, Local not specified - PPO | Admitting: Sports Medicine

## 2017-04-01 ENCOUNTER — Ambulatory Visit (INDEPENDENT_AMBULATORY_CARE_PROVIDER_SITE_OTHER): Payer: Federal, State, Local not specified - PPO | Admitting: Sports Medicine

## 2017-04-01 ENCOUNTER — Encounter: Payer: Self-pay | Admitting: Sports Medicine

## 2017-04-01 DIAGNOSIS — M722 Plantar fascial fibromatosis: Secondary | ICD-10-CM

## 2017-04-01 NOTE — Progress Notes (Signed)

## 2017-04-01 NOTE — Assessment & Plan Note (Signed)
New set of orthotics as above 

## 2017-04-05 ENCOUNTER — Other Ambulatory Visit: Payer: Self-pay | Admitting: Physician Assistant

## 2017-04-11 ENCOUNTER — Other Ambulatory Visit: Payer: Self-pay | Admitting: Physician Assistant

## 2017-04-12 ENCOUNTER — Other Ambulatory Visit: Payer: Self-pay | Admitting: Physician Assistant

## 2017-04-13 ENCOUNTER — Other Ambulatory Visit: Payer: Self-pay | Admitting: Physician Assistant

## 2017-05-28 ENCOUNTER — Other Ambulatory Visit: Payer: Self-pay | Admitting: Physician Assistant

## 2017-06-01 ENCOUNTER — Ambulatory Visit (INDEPENDENT_AMBULATORY_CARE_PROVIDER_SITE_OTHER): Payer: Federal, State, Local not specified - PPO | Admitting: Physician Assistant

## 2017-06-01 ENCOUNTER — Encounter: Payer: Self-pay | Admitting: Physician Assistant

## 2017-06-01 VITALS — BP 111/68 | HR 71 | Ht 62.75 in | Wt 189.0 lb

## 2017-06-01 DIAGNOSIS — G43711 Chronic migraine without aura, intractable, with status migrainosus: Secondary | ICD-10-CM

## 2017-06-01 DIAGNOSIS — E669 Obesity, unspecified: Secondary | ICD-10-CM

## 2017-06-01 DIAGNOSIS — R202 Paresthesia of skin: Secondary | ICD-10-CM

## 2017-06-01 MED ORDER — NALTREXONE-BUPROPION HCL ER 8-90 MG PO TB12: 2.0000 | ORAL_TABLET | Freq: Two times a day (BID) | ORAL | 2 refills | Status: DC

## 2017-06-01 MED ORDER — TOPIRAMATE ER 25 MG PO CAP24: 1.0000 | ORAL_CAPSULE | Freq: Every day | ORAL | 0 refills | Status: DC

## 2017-06-01 NOTE — Progress Notes (Signed)
   Subjective:    Patient ID: Angela Oliver, female    DOB: 08/27/1966, 51 y.o.   MRN: [id number]  HPI  Pt is a 51 yo female who presents to the clinic to follow up on contrave and discuss weight loss. She started contrave at the end of April and has lost 10lbs. She has tolerated this very well. She is walking a few times a week. She would like to continue medication.   She continues to have problems with migraines. She is having 3-4 a week. She cannot take rescue which works due to job restrictions. She is on propranol and has bee for a while. She had side effects to immediate release topmax.   She complains of bilateral hand numbness at night. This has been going on for a while.    Review of Systems  All other systems reviewed and are negative.      Objective:   Physical Exam  Constitutional: She is oriented to person, place, and time. She appears well-developed and well-nourished.  HENT:  Head: Normocephalic and atraumatic.  Cardiovascular: Normal rate, regular rhythm and normal heart sounds.   Pulmonary/Chest: Effort normal and breath sounds normal.  Musculoskeletal:  Positive tinels on right.  Negative phalens, bilaterally.  Hand grip 5/5.   Neurological: She is alert and oriented to person, place, and time.  Psychiatric: She has a normal mood and affect. Her behavior is normal.          Assessment & Plan:  Marland Kitchen.Marland Kitchen.Angela Oliver was seen today for obesity.  Diagnoses and all orders for this visit:  Obesity (BMI 30.0-34.9) -     Naltrexone-Bupropion HCl ER 8-90 MG TB12; Take 2 tablets by mouth 2 (two) times daily.  Intractable chronic migraine without aura and with status migrainosus -     Topiramate ER 25 MG CP24; Take 1 tablet by mouth daily.  Paresthesia of both hands -     NCV with EMG(electromyography); Future   Encouraged patient to continue weight loss journey.   Added Trokendi. Discussed side effects. Hoping she does have side effects with ER version. Follow up in 1  month.

## 2017-06-01 NOTE — Patient Instructions (Signed)
[employer] Editor, commission[employer]y [employer]

## 2017-06-04 ENCOUNTER — Other Ambulatory Visit: Payer: Self-pay | Admitting: Physician Assistant

## 2017-06-24 ENCOUNTER — Other Ambulatory Visit: Payer: Self-pay | Admitting: Physician Assistant

## 2017-06-25 ENCOUNTER — Other Ambulatory Visit: Payer: Self-pay | Admitting: Physician Assistant

## 2017-06-27 ENCOUNTER — Other Ambulatory Visit: Payer: Self-pay | Admitting: Physician Assistant

## 2017-06-27 DIAGNOSIS — G43711 Chronic migraine without aura, intractable, with status migrainosus: Secondary | ICD-10-CM

## 2017-07-07 ENCOUNTER — Encounter: Payer: Self-pay | Admitting: Physician Assistant

## 2017-07-07 ENCOUNTER — Ambulatory Visit (INDEPENDENT_AMBULATORY_CARE_PROVIDER_SITE_OTHER): Payer: Federal, State, Local not specified - PPO | Admitting: Physician Assistant

## 2017-07-07 VITALS — BP 111/72 | HR 60 | Temp 98.2°F | Ht 64.0 in | Wt 194.8 lb

## 2017-07-07 DIAGNOSIS — R635 Abnormal weight gain: Secondary | ICD-10-CM | POA: Diagnosis not present

## 2017-07-07 DIAGNOSIS — N644 Mastodynia: Secondary | ICD-10-CM | POA: Diagnosis not present

## 2017-07-07 DIAGNOSIS — E669 Obesity, unspecified: Secondary | ICD-10-CM | POA: Diagnosis not present

## 2017-07-07 DIAGNOSIS — Z803 Family history of malignant neoplasm of breast: Secondary | ICD-10-CM

## 2017-07-07 MED ORDER — NALTREXONE-BUPROPION HCL ER 8-90 MG PO TB12: ORAL_TABLET | ORAL | 0 refills | Status: DC

## 2017-07-07 NOTE — Patient Instructions (Signed)
Mammogram and Ultrasound [facility] take place [facility] [address], [place], [place] [address] [phone]

## 2017-07-07 NOTE — Progress Notes (Addendum)
HPI:                                                                Angela Oliver is a 51 y.o. female who [facility] today for left breast pain  Patient complains of intermittent left breast pain x 5 days. Denies trauma to her breast. Describes a throbbing ache. Denies palpable lump/mass. Denies nipple pain and discharge. Today reports it is only tender to palpation. LMP 2 months ago. She has never had a mammogram. Reports family history of breast cancer in her mother, diagnosed late 3540's or early 150's.   Patient also states she was on Contrave for 1 month, but had to stop due to cost. She was switched to Trokendi, but states this is not working and causing diarrhea. She is interested in re-starting Contrave and wants a coupon.  Past Medical History:  Diagnosis Date  . Anxiety   . Arthritis   . Asthma   . Complication of anesthesia    slow to awaken  . Family history of adverse reaction to anesthesia    mother slow to awaken  . GERD (gastroesophageal reflux disease)   . Herpes simplex   . Irregular heart beat    in the past- no palpations- "skipped a beat"  . Migraines   . Seasonal allergies    Past Surgical History:  Procedure Laterality Date  . APPENDECTOMY    . CHOLECYSTECTOMY  06/01/2016   laproscopic   . CHOLECYSTECTOMY N/A 06/01/2016   Procedure: LAPAROSCOPIC CHOLECYSTECTOMY WITH INTRAOPERATIVE CHOLANGIOGRAM, REMOVAL PERITONEAL NODULE ADJACENT TO SIGMOID COLON;  Surgeon: Avel Peaceodd Rosenbower, MD;  [facility];  Service: General;  Laterality: N/A;  . COLONOSCOPY     age 51  . OVARIAN CYST REMOVAL Left    part of ovary- cyst hadf ruptured   Social History  Substance Use Topics  . Smoking status: Never Smoker  . Smokeless tobacco: Never Used  . Alcohol use No   family history includes Alcohol abuse in her father; Asthma in her other; Cancer in her mother; Diabetes in her father; Hyperlipidemia in her  father.  ROS: negative except as noted in the HPI  Medications: Current Outpatient Prescriptions  Medication Sig Dispense Refill  . acyclovir (ZOVIRAX) 400 MG tablet TAKE 1 TABLET BY MOUTH 2 TIMES DAILY. 60 tablet 0  . Albuterol Sulfate (PROAIR RESPICLICK) 108 (90 BASE) MCG/ACT AEPB Inhale 2 puffs into the lungs every 4 (four) hours as needed. (Patient taking differently: Inhale 2 puffs into the lungs every 4 (four) hours as needed (shortness of breath/ wheezing). ) 1 each 2  . cetirizine (ZYRTEC) 10 MG tablet Take 10 mg by mouth daily.    . meloxicam (MOBIC) 15 MG tablet TAKE 1 TO 2 TABLETS BY MOUTH EVERY DAY FOR 2 WEEKS THEN AS NEEDED FOR JOINT PAIN 90 tablet 2  . montelukast (SINGULAIR) 10 MG tablet TAKE 1 TABLET (10 MG TOTAL) BY MOUTH DAILY. 90 tablet 3  . omeprazole (PRILOSEC) 40 MG capsule TAKE 1 CAPSULE (40 MG TOTAL) BY MOUTH DAILY. 90 capsule 1  . propranolol (INDERAL) 40 MG tablet TAKE 1 TABLET (40 MG TOTAL) BY MOUTH 2 (TWO) TIMES DAILY. 60 tablet 3  . QVAR 80 MCG/ACT inhaler  INHALE 2 PUFFS INTO THE LUNGS 2 (TWO) TIMES DAILY. 8.7 g 4  . sertraline (ZOLOFT) 50 MG tablet TAKE 1 TABLET (50 MG TOTAL) BY MOUTH DAILY. (Patient taking differently: Take 25 mg by mouth daily. ) 30 tablet 1   No current facility-administered medications for this visit.    Allergies  Allergen Reactions  . Sulfa Antibiotics Hives, Shortness Of Breath, Swelling and Rash    Throat swelling  . Sulfonamide Derivatives Hives, Shortness Of Breath, Swelling and Rash    Throat swelling  . Topamax [Topiramate] Other (See Comments)    Severe stomach pain       Objective:  BP 111/72   Pulse 60   Temp 98.2 F (36.8 C)   Ht 5\' 4"  (1.626 m)   Wt 194 lb 12.8 oz (88.4 kg)   LMP 04/15/2017 (Exact Date)   SpO2 97%   BMI 33.44 kg/m  Gen: well-groomed, cooperative, not ill-appearing, no distress HEENT: normal conjunctiva,  trachea midline Pulm: Normal work of breathing, normal phonation Breasts:  deferred Neuro: alert and oriented x 3, EOM's intact, no tremor MSK: extremities atraumatic, normal gait and station Skin: no rashes on exposed skin    No results found for this or any previous visit (from the past 72 hour(s)). No results found.    Assessment and Plan: 51 y.o. female with   1. Pain of right breast - patient declined breast exam. This is reasonable since she will undergo imaging and an exam at the [facility]; Future - US BREAST LTD UNI RIGHT INC AXILLA; Future  2. Family history of breast cancer in mother   3. Obesity (BMI 30.0-34.9) - will do Contrave through mail order pharmacy [facility] for $99/month - discontinuing Trokendi. She has gained 6 pounds. - patient will need to schedule an appointment with PCP to discuss weight loss goals include diet and exercise plan. There was not adequate time this visit to address this. - Naltrexone-Bupropion HCl ER 8-90 MG TB12; 1 tab daily for week 1, then 1 tab BID for week 2, then 2 tab PO qAM and 1 tab PO qPM for week 3, then 2 tabs BID.  Dispense: 80 tablet; Refill: 0  4. Abnormal weight gain Wt Readings from Last 3 Encounters:  07/07/17 194 lb 12.8 oz (88.4 kg)  06/01/17 189 lb (85.7 kg)  04/01/17 197 lb 9.6 oz (89.6 kg)  - Naltrexone-Bupropion HCl ER 8-90 MG TB12; 1 tab daily for week 1, then 1 tab BID for week 2, then 2 tab PO qAM and 1 tab PO qPM for week 3, then 2 tabs BID.  Dispense: 80 tablet; Refill: 0  Patient education and anticipatory guidance given Patient agrees with treatment plan Follow-up w/PCP in 4 weeks for weight check or sooner as needed  Levonne Hubertharley E. Othell Jaime PA-C

## 2017-07-12 ENCOUNTER — Other Ambulatory Visit: Payer: Self-pay | Admitting: Sports Medicine

## 2017-07-12 DIAGNOSIS — E66811 Obesity, class 1: Secondary | ICD-10-CM

## 2017-07-12 DIAGNOSIS — E669 Obesity, unspecified: Secondary | ICD-10-CM

## 2017-07-12 DIAGNOSIS — R635 Abnormal weight gain: Secondary | ICD-10-CM

## 2017-07-12 MED ORDER — NALTREXONE-BUPROPION HCL ER 8-90 MG PO TB12: ORAL_TABLET | ORAL | 0 refills | Status: DC

## 2017-07-21 ENCOUNTER — Ambulatory Visit: Payer: Federal, State, Local not specified - PPO | Admitting: Family Medicine

## 2017-07-21 ENCOUNTER — Ambulatory Visit (INDEPENDENT_AMBULATORY_CARE_PROVIDER_SITE_OTHER): Payer: Federal, State, Local not specified - PPO | Admitting: Family Medicine

## 2017-07-21 ENCOUNTER — Ambulatory Visit (INDEPENDENT_AMBULATORY_CARE_PROVIDER_SITE_OTHER): Payer: Federal, State, Local not specified - PPO

## 2017-07-21 ENCOUNTER — Encounter: Payer: Self-pay | Admitting: Family Medicine

## 2017-07-21 VITALS — BP 108/73 | HR 62 | Ht 64.0 in | Wt 191.0 lb

## 2017-07-21 DIAGNOSIS — M25562 Pain in left knee: Secondary | ICD-10-CM | POA: Diagnosis not present

## 2017-07-21 DIAGNOSIS — M1711 Unilateral primary osteoarthritis, right knee: Secondary | ICD-10-CM

## 2017-07-21 DIAGNOSIS — M25561 Pain in right knee: Secondary | ICD-10-CM | POA: Diagnosis not present

## 2017-07-21 DIAGNOSIS — S8992XA Unspecified injury of left lower leg, initial encounter: Secondary | ICD-10-CM | POA: Diagnosis not present

## 2017-07-21 MED ORDER — DICLOFENAC SODIUM 1 % TD GEL: 4.0000 g | Freq: Four times a day (QID) | TRANSDERMAL | 11 refills | Status: DC

## 2017-07-21 NOTE — Patient Instructions (Signed)
Thank you for coming in today. Apply voltaren gel for pain 4x daily as needed.   Recheck with me in 6 weeks or sooner for knee pain.   Call or go to the ER if you develop a large red swollen joint with extreme pain or oozing puss.    Meniscus Tear A meniscus tear is a knee injury in which a piece of the meniscus is torn. The meniscus is a thick, rubbery, wedge-shaped cartilage in the knee. Two menisci are located in each knee. They sit between the upper bone (femur) and lower bone (tibia) that make up the knee joint. Each meniscus acts as a shock absorber for the knee. A torn meniscus is one of the most common types of knee injuries. This injury can range from mild to severe. Surgery may be needed for a severe tear. What are the causes? This injury may be caused by any squatting, twisting, or pivoting movement. Sports-related injuries are the most common cause. These often occur from:  Running and stopping suddenly.  Changing direction.  Being tackled or knocked off your feet.  As people get older, their meniscus gets thinner and weaker. In these people, tears can happen more easily, such as from climbing stairs. What increases the risk? This injury is more likely to happen to:  People who play contact sports.  Males.  People who are 6430?53 years of age.  What are the signs or symptoms? Symptoms of this injury include:  Knee pain, especially at the side of the knee joint. You may feel pain when the injury occurs, or you may only hear a pop and feel pain later.  A feeling that your knee is clicking, catching, locking, or giving way.  Not being able to fully bend or extend your knee.  Bruising or swelling in your knee.  How is this diagnosed? This injury may be diagnosed based on your symptoms and a physical exam. The physical exam may include:  Moving your knee in different ways.  Feeling for tenderness.  Listening for a clicking sound.  Checking if your knee locks or  catches.  You may also have tests, such as:  X-rays.  MRI.  A procedure to look inside your knee with a narrow surgical telescope (arthroscopy).  You may be referred to a knee specialist (orthopedic surgeon). How is this treated? Treatment for this injury depends on the severity of the tear. Treatment for a mild tear may include:  Rest.  Medicine to reduce pain and swelling. This is usually a nonsteroidal anti-inflammatory drug (NSAID).  A knee brace or an elastic sleeve or wrap.  Using crutches or a walker to keep weight off your knee and to help you walk.  Exercises to strengthen your knee (physical therapy).  You may need surgery if you have a severe tear or if other treatments are not working. Follow these instructions at home: Managing pain and swelling  Take over-the-counter and prescription medicines only as told by your health care provider.  If directed, apply ice to the injured area: ? Put ice in a plastic bag. ? Place a towel between your skin and the bag. ? Leave the ice on for 20 minutes, 2-3 times per day.  Raise (elevate) the injured area above the level of your heart while you are sitting or lying down. Activity  Do not use the injured limb to support your body weight until your health care provider says that you can. Use crutches or a walker as told  by your health care provider.  Return to your normal activities as told by your health care provider. Ask your health care provider what activities are safe for you.  Perform range-of-motion exercises only as told by your health care provider.  Begin doing exercises to strengthen your knee and leg muscles only as told by your health care provider. After you recover, your health care provider may recommend these exercises to help prevent another injury. General instructions  Use a knee brace or elastic wrap as told by your health care provider.  Keep all follow-up visits as told by your health care  provider. This is important. Contact a health care provider if:  You have a fever.  Your knee becomes red, tender, or swollen.  Your pain medicine is not helping.  Your symptoms get worse or do not improve after 2 weeks of home care. This information is not intended to replace advice given to you by your health care provider. Make sure you discuss any questions you have with your health care provider. Document Released: 02/19/2003 Document Revised: 05/06/2016 Document Reviewed: 03/24/2015 Elsevier Interactive Patient Education  [employer].

## 2017-07-21 NOTE — Progress Notes (Signed)
Angela Oliver is a 51 y.o. female who presents to [facility] today for right knee pain.  Patient reports right knee pain for 1 week. She reports that she has twisted her knee a few times and also hyperextended it. Patient describes her pain as dull and achy at rest and sharp with movement. She grades it as 6/10 in severity. She has noted improvement in her pain with rest and ibuprofen. 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 Patient has that she now limps due to the pain and has difficulty fully extending her knee. She has difficulty climbing stairs. Patient reports a sensation of fluid in the back of her knee.    Past Medical History:  Diagnosis Date  . Anxiety   . Arthritis   . Asthma   . Complication of anesthesia    slow to awaken  . Family history of adverse reaction to anesthesia    mother slow to awaken  . GERD (gastroesophageal reflux disease)   . Herpes simplex   . Irregular heart beat    in the past- no palpations- "skipped a beat"  . Migraines   . Seasonal allergies    Past Surgical History:  Procedure Laterality Date  . APPENDECTOMY    . CHOLECYSTECTOMY  06/01/2016   laproscopic   . CHOLECYSTECTOMY N/A 06/01/2016   Procedure: LAPAROSCOPIC CHOLECYSTECTOMY WITH INTRAOPERATIVE CHOLANGIOGRAM, REMOVAL PERITONEAL NODULE ADJACENT TO SIGMOID COLON;  Surgeon: Avel Peace, MD;  Location: [facility] OR;  Service: General;  Laterality: N/A;  . COLONOSCOPY     age 22  . OVARIAN CYST REMOVAL Left    part of ovary- cyst hadf ruptured   Social History  Substance Use Topics  . Smoking status: Never Smoker  . Smokeless tobacco: Never Used  . Alcohol use No     ROS:  As above   Medications: Current Outpatient Prescriptions  Medication Sig Dispense Refill  . acyclovir (ZOVIRAX) 400 MG tablet TAKE 1 TABLET BY MOUTH 2 TIMES DAILY. 60 tablet 0  . Albuterol Sulfate (PROAIR RESPICLICK) 108 (90 BASE) MCG/ACT AEPB  Inhale 2 puffs into the lungs every 4 (four) hours as needed. (Patient taking differently: Inhale 2 puffs into the lungs every 4 (four) hours as needed (shortness of breath/ wheezing). ) 1 each 2  . cetirizine (ZYRTEC) 10 MG tablet Take 10 mg by mouth daily.    . meloxicam (MOBIC) 15 MG tablet TAKE 1 TO 2 TABLETS BY MOUTH EVERY DAY FOR 2 WEEKS THEN AS NEEDED FOR JOINT PAIN 90 tablet 2  . montelukast (SINGULAIR) 10 MG tablet TAKE 1 TABLET (10 MG TOTAL) BY MOUTH DAILY. 90 tablet 3  . Naltrexone-Bupropion HCl ER 8-90 MG TB12 1 tab daily for week 1, then 1 tab BID for week 2, then 2 tab PO qAM and 1 tab PO qPM for week 3, then 2 tabs BID. 120 tablet 0  . omeprazole (PRILOSEC) 40 MG capsule TAKE 1 CAPSULE (40 MG TOTAL) BY MOUTH DAILY. 90 capsule 1  . propranolol (INDERAL) 40 MG tablet TAKE 1 TABLET (40 MG TOTAL) BY MOUTH 2 (TWO) TIMES DAILY. 60 tablet 3  . QVAR 80 MCG/ACT inhaler INHALE 2 PUFFS INTO THE LUNGS 2 (TWO) TIMES DAILY. 8.7 g 4  . sertraline (ZOLOFT) 50 MG tablet TAKE 1 TABLET (50 MG TOTAL) BY MOUTH DAILY. (Patient taking differently: Take 25 mg by mouth daily. ) 30 tablet 1  . diclofenac sodium (VOLTAREN) 1 % GEL Apply 4  g topically 4 (four) times daily. To affected joint. 100 g 11   No current facility-administered medications for this visit.    Allergies  Allergen Reactions  . Sulfa Antibiotics Hives, Shortness Of Breath, Swelling and Rash    Throat swelling  . Sulfonamide Derivatives Hives, Shortness Of Breath, Swelling and Rash    Throat swelling  . Topamax [Topiramate] Other (See Comments)    Severe stomach pain     Exam:  BP 108/73   Pulse 62   Ht 5\' 4"  (1.626 m)   Wt 191 lb (86.6 kg)   BMI 32.79 kg/m  General: Well Developed, well nourished, and in no acute distress.  Neuro/Psych: Alert and oriented x3, extra-ocular muscles intact, able to move all 4 extremities, sensation grossly intact. Skin: Warm and dry, no rashes noted.  Respiratory: Not using accessory muscles,  speaking in full sentences, trachea midline.  Cardiovascular: Pulses palpable, no extremity edema. Abdomen: Does not appear distended. MSK: Right knee: No gross deformity noted on inspection, mild swelling noted with palpation in popliteal fossa Tenderness to palpation at lateral joint line, crepitus noted with flexion and extension, no pain when applying varus or valgus force Range of motion is 10-120 degrees, patient notes pain when attempting to fully extended leg Strength is 5/5 with flexion and extension McMurray testing is negative, Lachman is negative, Posterior drawer testing is negative, Thessaly testing is negative  Limited musculoskeletal ultrasound of the right knee reveals a mild joint effusion with no other significant abnormalities. No obvious Baker's cyst. Normal bony structure.  Procedure: Real-time Ultrasound Guided Injection of right knee  Device: GE Logiq E  Images permanently stored and available for review in the ultrasound unit. Verbal informed consent obtained. Discussed risks and benefits of procedure. Warned about infection bleeding damage to structures skin hypopigmentation and fat atrophy among others. Patient expresses understanding and agreement Time-out conducted.  Noted no overlying erythema, induration, or other signs of local infection.  Skin prepped in a sterile fashion.  Local anesthesia: Topical Ethyl chloride.  With sterile technique and under real time ultrasound guidance: 80mg  kenalog and 4ml marcaine injected easily.  Completed without difficulty  Pain immediately resolved suggesting accurate placement of the medication.  Advised to call if fevers/chills, erythema, induration, drainage, or persistent bleeding.  Images permanently stored and available for review in the ultrasound unit.  Impression: Technically successful ultrasound guided injection.  X-ray right knee: Shows right knee shows minimal lateral DJD and patellofemoral DJD. No  obvious fractures. Awaiting formal radiology review.   No results found for this or any previous visit (from the past 48 hour(s)). No results found.    Assessment and Plan: 51 y.o. female with right knee pain. Given patient's characterization of pain and physical exam findings, it is most likely that there is a knee effusion resulting in inflammation and pain. An ultrasound was performed which showed the presence of fluid anterior to the knee. Patient underwent a knee x-ray to further assess for structural damage. The final read on this exam is pending, however, preliminary review shows signs of mild degenerative joint disease in the lateral knee. Patient received a steroid injection in clinic today. Patient will also try applying diclofenac gel. She will continue to monitor symptoms and return if symptoms do not significantly improve or worsen.     Orders Placed This Encounter  Procedures  . DG Knee 1-2 Views Left    Standing Status:   Future    Number of Occurrences:   1  Standing Expiration Date:   09/21/2018    Order Specific Question:   Reason for Exam (SYMPTOM  OR DIAGNOSIS REQUIRED)    Answer:   For use with right knee x-ray, bilateral AP and Rosenberg standing.    Order Specific Question:   Is the patient pregnant?    Answer:   No    Order Specific Question:   Preferred imaging location?    Answer:   [facility]  . DG Knee Complete 4 Views Right    Please include patellar sunrise, lateral, and weightbearing bilateral AP and bilateral rosenberg views    Standing Status:   Future    Number of Occurrences:   1    Standing Expiration Date:   09/20/2018    Order Specific Question:   Reason for exam:    Answer:   Please include patellar sunrise, lateral, and weightbearing bilateral AP and bilateral rosenberg views    Comments:   Please include patellar sunrise, lateral, and weightbearing bilateral AP and bilateral rosenberg views    Order Specific Question:   Preferred  imaging location?    Answer:   [facility]   Meds ordered this encounter  Medications  . diclofenac sodium (VOLTAREN) 1 % GEL    Sig: Apply 4 g topically 4 (four) times daily. To affected joint.    Dispense:  100 g    Refill:  11    Discussed warning signs or symptoms. Please see discharge instructions. Patient expresses understanding.

## 2017-07-27 ENCOUNTER — Other Ambulatory Visit: Payer: Self-pay | Admitting: Physician Assistant

## 2017-07-27 DIAGNOSIS — G43711 Chronic migraine without aura, intractable, with status migrainosus: Secondary | ICD-10-CM

## 2017-08-05 ENCOUNTER — Telehealth: Payer: Self-pay | Admitting: Physician Assistant

## 2017-08-05 NOTE — Telephone Encounter (Signed)
Pt called. Requesting Refill on Omeprazole delayed response capsules 40 mg.

## 2017-08-08 ENCOUNTER — Other Ambulatory Visit: Payer: Self-pay | Admitting: Sports Medicine

## 2017-08-08 ENCOUNTER — Telehealth: Payer: Self-pay

## 2017-08-08 DIAGNOSIS — E669 Obesity, unspecified: Secondary | ICD-10-CM

## 2017-08-08 DIAGNOSIS — R635 Abnormal weight gain: Secondary | ICD-10-CM

## 2017-08-08 NOTE — Telephone Encounter (Signed)
Error

## 2017-08-08 NOTE — Telephone Encounter (Signed)
Ok to be put back on omeprazole but that is what I already see her own. Ok to send 90 day with 1 refill.

## 2017-08-08 NOTE — Telephone Encounter (Signed)
Pt called and wanted to know if she could be put back on Omeprazole 40 mg? Pt stated that she likes it better. Please advise?

## 2017-08-09 ENCOUNTER — Other Ambulatory Visit: Payer: Self-pay | Admitting: Physician Assistant

## 2017-08-09 MED ORDER — OMEPRAZOLE 40 MG PO CPDR: 40.0000 mg | DELAYED_RELEASE_CAPSULE | Freq: Every day | ORAL | 1 refills | Status: DC

## 2017-08-09 NOTE — Telephone Encounter (Signed)
Rx sent. Left VM advising Pt.

## 2017-08-09 NOTE — Telephone Encounter (Signed)
Refill sent.

## 2017-08-25 ENCOUNTER — Telehealth: Payer: Self-pay

## 2017-08-25 DIAGNOSIS — M25561 Pain in right knee: Secondary | ICD-10-CM

## 2017-08-25 NOTE — Telephone Encounter (Signed)
Physical therapy ordered

## 2017-08-25 NOTE — Telephone Encounter (Signed)
Pt called stating that she was seen last month for right knee pain and received an injection that helped with pain for only 1 day. Pt would like an order for PT requesting 2 sessions for 3 week for a total of 6 sessions. Please advise.

## 2017-08-27 ENCOUNTER — Other Ambulatory Visit: Payer: Self-pay | Admitting: Physician Assistant

## 2017-08-28 ENCOUNTER — Other Ambulatory Visit: Payer: Self-pay | Admitting: Physician Assistant

## 2017-08-28 DIAGNOSIS — G43711 Chronic migraine without aura, intractable, with status migrainosus: Secondary | ICD-10-CM

## 2017-08-29 NOTE — Telephone Encounter (Signed)
Left detailed vm with recommendations. Requested a call back with concerns.  

## 2017-09-12 ENCOUNTER — Encounter: Payer: Self-pay | Admitting: Physical Therapy

## 2017-09-12 ENCOUNTER — Ambulatory Visit (INDEPENDENT_AMBULATORY_CARE_PROVIDER_SITE_OTHER): Payer: Federal, State, Local not specified - PPO | Admitting: Physical Therapy

## 2017-09-12 DIAGNOSIS — M6281 Muscle weakness (generalized): Secondary | ICD-10-CM | POA: Diagnosis not present

## 2017-09-12 DIAGNOSIS — M25661 Stiffness of right knee, not elsewhere classified: Secondary | ICD-10-CM | POA: Diagnosis not present

## 2017-09-12 DIAGNOSIS — M25561 Pain in right knee: Secondary | ICD-10-CM | POA: Diagnosis not present

## 2017-09-12 NOTE — Therapy (Signed)
[facility] [place] [address] [place], [place], [address] Phone: [phone]   Fax:  [phone]  Physical Therapy Evaluation  Patient Details  Name: Angela Oliver MRN: [id number] Date of Birth: [birth year] Referring Provider: Dr Teressa Lower  Encounter Date: 09/12/2017      PT End of Session - 09/12/17 1412    Visit Number 1   Number of Visits 6   Date for PT Re-Evaluation 10/03/17   PT Start Time 1412   PT Stop Time 1516   PT Time Calculation (min) 64 min   Activity Tolerance Patient tolerated treatment well      Past Medical History:  Diagnosis Date  . Anxiety   . Arthritis   . Asthma   . Complication of anesthesia    slow to awaken  . Family history of adverse reaction to anesthesia    mother slow to awaken  . GERD (gastroesophageal reflux disease)   . Herpes simplex   . Irregular heart beat    in the past- no palpations- "skipped a beat"  . Migraines   . Seasonal allergies     Past Surgical History:  Procedure Laterality Date  . APPENDECTOMY    . CHOLECYSTECTOMY  06/01/2016   laproscopic   . CHOLECYSTECTOMY N/A 06/01/2016   Procedure: LAPAROSCOPIC CHOLECYSTECTOMY WITH INTRAOPERATIVE CHOLANGIOGRAM, REMOVAL PERITONEAL NODULE ADJACENT TO SIGMOID COLON;  Surgeon: Avel Peace, MD;  Location: [facility] OR;  Service: General;  Laterality: N/A;  . COLONOSCOPY     age 22  . OVARIAN CYST REMOVAL Left    part of ovary- cyst hadf ruptured    There were no vitals filed for this visit.       Subjective Assessment - 09/12/17 1412    Subjective Pt reports about 2 months ago she was running late, got out of her car quickly and felt a pop and pain.  She had swelling, went to MD and had an injection with minimal relief.  She is still having pain and has some knots in the knee wit hlimited motion due to pressure behind the knee   How long can you sit comfortably? no limitations   How long can you walk comfortably? immediate  discomfort, pushes through the pain.     Diagnostic tests x-rays (-) for fx, did see fluid behind the knee cap   Patient Stated Goals walk without pain and swelling, bend the knee through full motion without feeling pressure. Squat at work.   Currently in Pain? Yes   Pain Score 7    Pain Location Knee   Pain Orientation Right;Anterior   Pain Descriptors / Indicators Throbbing   Pain Type Acute pain   Pain Onset More than a month ago   Pain Frequency Intermittent   Aggravating Factors  bending the knee and being up on it   Pain Relieving Factors rest            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   Leisure play with animals, watch TV     Observation/Other Assessments   Focus on Therapeutic Outcomes (FOTO)  60% limited     Functional Tests   Functional tests Squat;Single leg stance     Squat   Comments pain in Rt knee, slight shift to Lt      Single Leg Stance   Comments Lt WNL Rt WNL for time, increased accessory motion compared to Lt      Posture/Postural Control   Posture/Postural Control Postural limitations   Postural Limitations Weight shift left  LE valgus     ROM / Strength   AROM / PROM / Strength AROM;Strength     AROM   AROM Assessment Site Knee   Right/Left Knee Left;Right   Right Knee Extension -2   Right Knee Flexion 125   Left Knee Extension -2   Left Knee Flexion 134     Strength   Strength Assessment Site Hip;Knee;Ankle   Right/Left Hip Right;Left   Right Hip ABduction 4/5    Left Hip ABduction 5/5   Right/Left Knee Right  Lt WNL   Right Knee Flexion 5/5   Right Knee Extension 4/5  due to pain   Right/Left Ankle --  WNL     Flexibility   Soft Tissue Assessment /Muscle Length yes   Hamstrings WNL   Quadriceps prone knee flex Rt 122 with pain in knee, Lt 135     Palpation   Patella mobility hypomobile Rt d/t edema  signficant lateral tracking Rt    Palpation comment tender along bilat tibial platues Rt      Special Tests    Special Tests Knee Special Tests   Knee Special tests  other            Objective measurements completed on examination: See above findings.          [facility] Adult PT Treatment/Exercise - 09/12/17 0001      Exercises   Exercises Knee/Hip     Knee/Hip Exercises: Supine   Quad Sets Strengthening;Right;10 reps  5-10 sec holds   Straight Leg Raise with External Rotation Strengthening;Right;3 sets;10 reps     Modalities   Modalities Electrical Stimulation;Vasopneumatic     Insurance claims handler Stimulation Location Rt knee   Electrical Stimulation Action ion repelling   Electrical Stimulation Parameters to tolerance   Electrical Stimulation Goals Edema     Vasopneumatic   Number Minutes Vasopneumatic  15 minutes   Vasopnuematic Location  Knee  Rt   Vasopneumatic Pressure Medium   Vasopneumatic Temperature  3*     Manual Therapy   Manual Therapy Taping;Other (comment)   Other Manual Therapy Rt knee dynamic tape for lateral tracking patella                PT Education - 09/12/17 1453    Education provided Yes   Education Details HEP    Person(s) Educated Patient   Methods Explanation;Demonstration;Handout   Comprehension Returned demonstration;Verbalized understanding             PT Long Term Goals - 09/12/17 1407      PT LONG TERM GOAL #1   Title I with advanced HEP (10/03/17)    Time 3   Period Weeks   Status New     PT LONG TERM GOAL #2   Title improve FOTO =/<  42% limited (10/03/17)  Time 3   Period Weeks   Status New     PT LONG TERM GOAL #3   Title report =/> 75% reduction in Rt knee pain with functional activities and work ( 10/03/17)    Time 3   Period Weeks   Status New     PT LONG TERM GOAL #4   Title Rt LE strength =/> Lt to allow her to perform her work without pain or difficulty in her knees.    Time 3   Period Weeks   Status New                Plan - 09/12/17 1511    Clinical Impression Statement 51 yo female ~ 2 months s/p rt knee injury.  She continues to have pain and difficulty with walking and bending.  Her Rt knee motion is limited, edema is present and she has weakness and lateral tracking of the patella.    Clinical Presentation Stable   Clinical Decision Making Low   PT Frequency 2x / week   PT Duration 3 weeks   PT Treatment/Interventions Moist Heat;Ultrasound;Therapeutic exercise;Dry needling;Taping;Vasopneumatic Device;Manual techniques;Neuromuscular re-education;Cryotherapy;Electrical Stimulation;Patient/family education;Iontophoresis /ml Dexamethasone   PT Next Visit Plan progress LE strengthening, proprioception, taping for lateral tracking   Consulted and Agree with Plan of Care Patient      Patient will benefit from skilled therapeutic intervention in order to improve the following deficits and impairments:  Decreased range of motion, Pain, Decreased strength, Increased edema, Postural dysfunction, Difficulty walking  Visit Diagnosis: Acute pain of right knee - Plan: PT plan of care cert/re-cert  Muscle weakness (generalized) - Plan: PT plan of care cert/re-cert  Stiffness of right knee, not elsewhere classified - Plan: PT plan of care cert/re-cert     Problem List Patient Active Problem List   Diagnosis Date Noted  . Pain of right breast 07/07/2017  . Family history of breast cancer in mother 07/07/2017  . Abnormal weight gain 07/07/2017  . Obesity (BMI 30.0-34.9) 01/27/2017  . Low  back sprain 07/28/2016  . C7 radiculopathy 12/01/2015  . Paresthesia of both hands 12/01/2015  . 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 Low back pain 02/19/2014  . Trochanteric bursitis of right hip 11/22/2013  . 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/2012  . 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 Herpes simplex 10/23/2012  . Allergic rhinitis 06/09/2011  . Asthma 06/09/2011  . GERD 06/09/2011    Roderic Scarce PT 09/12/2017, 3:54 PM  [facility] [address] [place], [place], [address] Phone: [phone]   Fax:  [phone]  Name: Angela Oliver MRN: [id number] Date of Birth: October 04, 1966

## 2017-09-12 NOTE — Patient Instructions (Signed)
Quad Set    With other leg bent, foot flat, slowly tighten muscles on thigh of straight leg while counting to __5-10__. Repeat __10__ times. Do __2-3__ sessions per day.  Straight Leg Raise: With External Leg Rotation - once lying down is easy, perform in a long sit position.     Lie on back with right leg straight, opposite leg bent. Rotate straight leg out and lift __8-10__ inches. Repeat __10-15__ times per set. Do ___3_ sets per session. Do __1__ sessions per day.  * Ice at least once a day 15-20 minutes*

## 2017-09-14 ENCOUNTER — Ambulatory Visit (INDEPENDENT_AMBULATORY_CARE_PROVIDER_SITE_OTHER): Payer: Federal, State, Local not specified - PPO | Admitting: Physical Therapy

## 2017-09-14 DIAGNOSIS — M6281 Muscle weakness (generalized): Secondary | ICD-10-CM

## 2017-09-14 DIAGNOSIS — M25661 Stiffness of right knee, not elsewhere classified: Secondary | ICD-10-CM | POA: Diagnosis not present

## 2017-09-14 DIAGNOSIS — M25561 Pain in right knee: Secondary | ICD-10-CM

## 2017-09-14 NOTE — Therapy (Signed)
[facility] [place] [address] [place], [place], [address] Phone: [phone]   Fax:  [phone]  Physical Therapy Treatment  Patient Details  Name: Angela Oliver MRN: [id number] Date of Birth: 1965/12/17 Referring Provider: Dr. Denyse Amass  Encounter Date: 09/14/2017      PT End of Session - 09/14/17 1557    Visit Number 2   Number of Visits 6   Date for PT Re-Evaluation 10/03/17   PT Start Time 1536   PT Stop Time 1607   PT Time Calculation (min) 31 min   Activity Tolerance Patient tolerated treatment well;Patient limited by pain   Behavior During Therapy [facility] for tasks assessed/performed      Past Medical History:  Diagnosis Date  . Anxiety   . Arthritis   . Asthma   . Complication of anesthesia    slow to awaken  . Family history of adverse reaction to anesthesia    mother slow to awaken  . GERD (gastroesophageal reflux disease)   . Herpes simplex   . Irregular heart beat    in the past- no palpations- "skipped a beat"  . Migraines   . Seasonal allergies     Past Surgical History:  Procedure Laterality Date  . APPENDECTOMY    . CHOLECYSTECTOMY  06/01/2016   laproscopic   . CHOLECYSTECTOMY N/A 06/01/2016   Procedure: LAPAROSCOPIC CHOLECYSTECTOMY WITH INTRAOPERATIVE CHOLANGIOGRAM, REMOVAL PERITONEAL NODULE ADJACENT TO SIGMOID COLON;  Surgeon: Avel Peace, MD;  Location: [facility] OR;  Service: General;  Laterality: N/A;  . COLONOSCOPY     age 38  . OVARIAN CYST REMOVAL Left    part of ovary- cyst hadf ruptured    There were no vitals filed for this visit.      Subjective Assessment - 09/14/17 1538    Subjective Pt arrives late, she reports she fell asleep waiting for appt (works 4am-1230pm). She has been busy at work, on her feet the whole day. She feels the tape helped reduce swelling a little.    Currently in Pain? Yes   Pain Score 8    Pain Location Knee   Pain Orientation Right;Anterior;Lateral   Pain  Descriptors / Indicators Throbbing   Aggravating Factors  prolonged standing, bending knee   Pain Relieving Factors rest, TENS            [facility] PT Assessment - 09/14/17 0001      Assessment   Medical Diagnosis Rt knee pain   Referring Provider Dr. Denyse Amass   Onset Date/Surgical Date 07/13/17   Next MD Visit after PT          [facility] Adult PT Treatment/Exercise - 09/14/17 0001      Knee/Hip Exercises: Stretches   Passive Hamstring Stretch Right;3 reps  45 seconds, supine with strap     Knee/Hip Exercises: Supine   Short Arc Quad Sets Strengthening;Right;1 set;10 reps  held 10 sec   Heel Slides Right;5 reps  to tolerance     Modalities   Modalities Proofreader Location Rt knee   Aeronautical engineer Parameters to tolerance    Electrical Stimulation Goals Edema;Pain     Vasopneumatic   Number Minutes Vasopneumatic  15 minutes   Vasopnuematic Location  Knee  Rt   Vasopneumatic Pressure Low   Vasopneumatic Temperature  34 deg     Manual Therapy   Other Manual Therapy Rt knee dynamic tape  for lateral tracking patella Horse shoe shape on lateral portion of knee pulling patella in medial direction with 25% stretch. Piece perpendicular applied to pull patella in medial direction with 25% stretch.                      PT Long Term Goals - 09/14/17 1604      PT LONG TERM GOAL #1   Title I with advanced HEP (10/03/17)    Time 3   Period Weeks   Status On-going     PT LONG TERM GOAL #2   Title improve FOTO =/< 42% limited (10/03/17)    Time 3   Period Weeks   Status On-going     PT LONG TERM GOAL #3   Title report =/> 75% reduction in Rt knee pain with functional activities and work ( 10/03/17)    Time 3   Period Weeks   Status On-going     PT LONG TERM GOAL #4   Title Rt LE strength =/> Lt to allow her to perform her work  without pain or difficulty in her knees.    Time 3   Period Weeks   Status On-going               Plan - 09/14/17 1600    Clinical Impression Statement Treatment time limited due to patient's late arrival.  She continues with increased swelling in Rt knee.  Positive response to dynamic taping; retaped today after vaso/estim.  She reported decrease in knee pain by 3 points at end of session. Progressing towards goals.    Rehab Potential Good   PT Frequency 2x / week   PT Duration 3 weeks   PT Treatment/Interventions Moist Heat;Ultrasound;Therapeutic exercise;Dry needling;Taping;Vasopneumatic Device;Manual techniques;Neuromuscular re-education;Cryotherapy;Electrical Stimulation;Patient/family education;Iontophoresis /ml Dexamethasone   PT Next Visit Plan progress LE strengthening, proprioception, taping for lateral tracking   Consulted and Agree with Plan of Care Patient      Patient will benefit from skilled therapeutic intervention in order to improve the following deficits and impairments:  Decreased range of motion, Pain, Decreased strength, Increased edema, Postural dysfunction, Difficulty walking  Visit Diagnosis: Acute pain of right knee  Muscle weakness (generalized)  Stiffness of right knee, not elsewhere classified     Problem List Patient Active Problem List   Diagnosis Date Noted  . Pain of right breast 07/07/2017  . Family history of breast cancer in mother 07/07/2017  . Abnormal weight gain 07/07/2017  . Obesity (BMI 30.0-34.9) 01/27/2017  . Low back sprain 07/28/2016  . C7 radiculopathy 12/01/2015  . Paresthesia of both hands 12/01/2015  . 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 Low back pain 02/19/2014  . Trochanteric bursitis of right hip 11/22/2013  . 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/2012  . 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 Herpes simplex 10/23/2012  . Allergic rhinitis 06/09/2011  . Asthma  06/09/2011  . GERD 06/09/2011   Mayer Camel, PTA 09/14/17 4:12 PM  [facility] [place] [address] [place], [place], [address] Phone: [phone]   Fax:  [phone]  Name: Angela Oliver MRN: [id number] Date of Birth: 01-Jan-1966

## 2017-09-19 ENCOUNTER — Ambulatory Visit (INDEPENDENT_AMBULATORY_CARE_PROVIDER_SITE_OTHER): Payer: Federal, State, Local not specified - PPO | Admitting: Physical Therapy

## 2017-09-19 DIAGNOSIS — M25561 Pain in right knee: Secondary | ICD-10-CM | POA: Diagnosis not present

## 2017-09-19 DIAGNOSIS — M25661 Stiffness of right knee, not elsewhere classified: Secondary | ICD-10-CM | POA: Diagnosis not present

## 2017-09-19 DIAGNOSIS — M6281 Muscle weakness (generalized): Secondary | ICD-10-CM | POA: Diagnosis not present

## 2017-09-19 NOTE — Therapy (Signed)
[facility] [place] [address] [place], [place], [address] Phone: [phone]   Fax:  [phone]  Physical Therapy Treatment  Patient Details  Name: Angela Oliver MRN: [id number] Date of Birth: [birth year] Referring Provider: Dr. Denyse Amass   Encounter Date: 09/19/2017      PT End of Session - 09/19/17 1435    Visit Number 3   Number of Visits 6   Date for PT Re-Evaluation 10/03/17   PT Start Time 1403   PT Stop Time 1450   PT Time Calculation (min) 47 min   Activity Tolerance Patient tolerated treatment well      Past Medical History:  Diagnosis Date  . Anxiety   . Arthritis   . Asthma   . Complication of anesthesia    slow to awaken  . Family history of adverse reaction to anesthesia    mother slow to awaken  . GERD (gastroesophageal reflux disease)   . Herpes simplex   . Irregular heart beat    in the past- no palpations- "skipped a beat"  . Migraines   . Seasonal allergies     Past Surgical History:  Procedure Laterality Date  . APPENDECTOMY    . CHOLECYSTECTOMY  06/01/2016   laproscopic   . CHOLECYSTECTOMY N/A 06/01/2016   Procedure: LAPAROSCOPIC CHOLECYSTECTOMY WITH INTRAOPERATIVE CHOLANGIOGRAM, REMOVAL PERITONEAL NODULE ADJACENT TO SIGMOID COLON;  Surgeon: Avel Peace, MD;  Location: [facility] OR;  Service: General;  Laterality: N/A;  . COLONOSCOPY     age 21  . OVARIAN CYST REMOVAL Left    part of ovary- cyst hadf ruptured    There were no vitals filed for this visit.      Subjective Assessment - 09/19/17 1409    Subjective Pt feels her Rt knee is not as swollen as last week. However motion still feels restricted. Escalator was broken so had to use the stairs at work.    Currently in Pain? Yes   Pain Score 5    Pain Location Knee   Pain Orientation Right;Anterior;Medial;Lateral   Pain Descriptors / Indicators Sharp   Aggravating Factors  bending knee, prolonged standing    Pain Relieving Factors rest  TENS            [facility] PT Assessment - 09/19/17 0001      Assessment   Medical Diagnosis Rt knee pain   Referring Provider Dr. Denyse Amass    Onset Date/Surgical Date 07/13/17   Next MD Visit after PT     AROM   Right Knee Flexion 125     Flexibility   Quadriceps Rt 115 deg with pain, Lt 130 deg.            [facility] Adult PT Treatment/Exercise - 09/19/17 0001      Self-Care   Self-Care Other Self-Care Comments   Other Self-Care Comments  Pt educated on self massage with roller to thighs and shins. Pt returned demo and verbalized understanding     Knee/Hip Exercises: Stretches   Passive Hamstring Stretch Right;2 reps;30 seconds   Quad Stretch Right;3 reps  45 sec. 1 rep on LLE     Knee/Hip Exercises: Supine   Straight Leg Raises Right;1 set;10 reps   Straight Leg Raise with External Rotation Right;1 set;5 reps  some pain     Electrical Stimulation   Electrical Stimulation Location Rt knee   Electrical Stimulation Action ion repelling   Electrical Stimulation Parameters to tolerance    Electrical Stimulation Goals Edema;Pain  Vasopneumatic   Number Minutes Vasopneumatic  15 minutes   Vasopnuematic Location  Knee  Rt   Vasopneumatic Pressure Low   Vasopneumatic Temperature  34 deg     Manual Therapy   Other Manual Therapy Rt knee dynamic tape for lateral tracking patella  applied with Rock tape (blue argyle) Horse shoe shape on lateral portion of knee pulling patella in medial direction with 25% stretch. Piece perpendicular applied to pull patella in medial direction with 25% stretch.   STM to Rt quad - tightness noted in rectus femoris.            PT Long Term Goals - 09/14/17 1604      PT LONG TERM GOAL #1   Title I with advanced HEP (10/03/17)    Time 3   Period Weeks   Status On-going     PT LONG TERM GOAL #2   Title improve FOTO =/< 42% limited (10/03/17)    Time 3   Period Weeks   Status On-going     PT LONG TERM GOAL #3   Title report =/>  75% reduction in Rt knee pain with functional activities and work ( 10/03/17)    Time 3   Period Weeks   Status On-going     PT LONG TERM GOAL #4   Title Rt LE strength =/> Lt to allow her to perform her work without pain or difficulty in her knees.    Time 3   Period Weeks   Status On-going               Plan - 09/19/17 1516    Clinical Impression Statement Pt had palpable tightness/tenderness in midbelly of Rt rectus femoris; reduced with MFR and STM. Rt quad remains tight.  She had difficulty completing 5 reps of SLR with ER on RLE. Pt reported decrease in pain to 0/10 at end of session after vaso and retaping. Trial of taping with Rock tape.      Rehab Potential Good   PT Frequency 2x / week   PT Duration 3 weeks   PT Treatment/Interventions Moist Heat;Ultrasound;Therapeutic exercise;Dry needling;Taping;Vasopneumatic Device;Manual techniques;Neuromuscular re-education;Cryotherapy;Electrical Stimulation;Patient/family education;Iontophoresis /ml Dexamethasone   PT Next Visit Plan assess response to Rock tape. Retape if needed.  continue strengthening/Stretching of Rt LE.    Consulted and Agree with Plan of Care Patient      Patient will benefit from skilled therapeutic intervention in order to improve the following deficits and impairments:  Decreased range of motion, Pain, Decreased strength, Increased edema, Postural dysfunction, Difficulty walking  Visit Diagnosis: No diagnosis found.     Problem List Patient Active Problem List   Diagnosis Date Noted  . Pain of right breast 07/07/2017  . Family history of breast cancer in mother 07/07/2017  . Abnormal weight gain 07/07/2017  . Obesity (BMI 30.0-34.9) 01/27/2017  . Low back sprain 07/28/2016  . C7 radiculopathy 12/01/2015  . Paresthesia of both hands 12/01/2015  . 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 Low back pain 02/19/2014  . Trochanteric bursitis of right  hip 11/22/2013  . 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/2012  . 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 Herpes simplex 10/23/2012  . Allergic rhinitis 06/09/2011  . Asthma 06/09/2011  . GERD 06/09/2011   Mayer Camel, PTA 09/19/17 5:16 PM  [facility] [place] [address] [place], [place], [address] Phone: [phone]   Fax:  [phone]  Name: Larae Caison MRN: [id number] Date  of Birth: 08-26-1966

## 2017-09-22 ENCOUNTER — Ambulatory Visit (INDEPENDENT_AMBULATORY_CARE_PROVIDER_SITE_OTHER): Payer: Federal, State, Local not specified - PPO | Admitting: Physical Therapy

## 2017-09-22 DIAGNOSIS — M25561 Pain in right knee: Secondary | ICD-10-CM | POA: Diagnosis not present

## 2017-09-22 DIAGNOSIS — M25661 Stiffness of right knee, not elsewhere classified: Secondary | ICD-10-CM | POA: Diagnosis not present

## 2017-09-22 DIAGNOSIS — M6281 Muscle weakness (generalized): Secondary | ICD-10-CM | POA: Diagnosis not present

## 2017-09-22 NOTE — Therapy (Signed)
[facility] [place] [address] [place], [place], [address] Phone: [phone]   Fax:  [phone]  Physical Therapy Treatment  Patient Details  Name: Angela Oliver MRN: [id number] Date of Birth: 05-22-1966 Referring Provider: Dr. Denyse Amass   Encounter Date: 09/22/2017      PT End of Session - 09/22/17 1410    Visit Number 4   Number of Visits 6   Date for PT Re-Evaluation 10/03/17   PT Start Time 1405   PT Stop Time 1459   PT Time Calculation (min) 54 min   Activity Tolerance Patient tolerated treatment well   Behavior During Therapy [facility] for tasks assessed/performed      Past Medical History:  Diagnosis Date  . Anxiety   . Arthritis   . Asthma   . Complication of anesthesia    slow to awaken  . Family history of adverse reaction to anesthesia    mother slow to awaken  . GERD (gastroesophageal reflux disease)   . Herpes simplex   . Irregular heart beat    in the past- no palpations- "skipped a beat"  . Migraines   . Seasonal allergies     Past Surgical History:  Procedure Laterality Date  . APPENDECTOMY    . CHOLECYSTECTOMY  06/01/2016   laproscopic   . CHOLECYSTECTOMY N/A 06/01/2016   Procedure: LAPAROSCOPIC CHOLECYSTECTOMY WITH INTRAOPERATIVE CHOLANGIOGRAM, REMOVAL PERITONEAL NODULE ADJACENT TO SIGMOID COLON;  Surgeon: Avel Peace, MD;  Location: [facility] OR;  Service: General;  Laterality: N/A;  . COLONOSCOPY     age 65  . OVARIAN CYST REMOVAL Left    part of ovary- cyst hadf ruptured    There were no vitals filed for this visit.      Subjective Assessment - 09/22/17 1410    Subjective Pt reports the tape helps, "to keep things from popping out".  Without tape, she notices her knee clicks and pops more.   She reports she has been doing her stretches for LE.  She doesn't feel like much has changed yet.    Currently in Pain? Yes   Pain Score 5   3/10 in AM.     Pain Location Knee   Pain Orientation  Right;Anterior   Pain Descriptors / Indicators Throbbing   Aggravating Factors  prolonged standing, bending knee   Pain Relieving Factors rest, TENS            [facility] PT Assessment - 09/22/17 0001      Assessment   Medical Diagnosis Rt knee pain   Referring Provider Dr. Denyse Amass    Onset Date/Surgical Date 07/13/17   Hand Dominance --  both   Next MD Visit after PT           [facility] Adult PT Treatment/Exercise - 09/22/17 0001      Knee/Hip Exercises: Stretches   Passive Hamstring Stretch Right;2 reps;30 seconds   Quad Stretch Right;4 reps  45 sec. 1 rep on LLE   Piriformis Stretch Right;Left;2 reps;30 seconds   Other Knee/Hip Stretches Rt ITB stretch with strap x 30 sec      Knee/Hip Exercises: Aerobic   Stationary Bike L1: 6 min      Knee/Hip Exercises: Sidelying   Hip ABduction Strengthening;Right;Left;1 set;10 reps   Clams 1 set of 10 on each side with ab Ambulance person Location Rt knee   Electrical Stimulation Action ion repelling   Electrical Stimulation Parameters to tolerance  Electrical Stimulation Goals Edema;Pain     Vasopneumatic   Number Minutes Vasopneumatic  15 minutes   Vasopnuematic Location  Knee  Rt   Vasopneumatic Pressure Low   Vasopneumatic Temperature  34 deg     Manual Therapy   Manual Therapy Soft tissue mobilization   Soft tissue mobilization STM to Rt hamstring, glute, piriformis    Other Manual Therapy Rock tape applied to Rt knee at distal ITB insertion in X pattern to decompress tissue and decrease pain, increase proprioception.            PT Long Term Goals - 09/14/17 1604      PT LONG TERM GOAL #1   Title I with advanced HEP (10/03/17)    Time 3   Period Weeks   Status On-going     PT LONG TERM GOAL #2   Title improve FOTO =/< 42% limited (10/03/17)    Time 3   Period Weeks   Status On-going     PT LONG TERM GOAL #3   Title report =/> 75% reduction in Rt knee pain with  functional activities and work ( 10/03/17)    Time 3   Period Weeks   Status On-going     PT LONG TERM GOAL #4   Title Rt LE strength =/> Lt to allow her to perform her work without pain or difficulty in her knees.    Time 3   Period Weeks   Status On-going               Plan - 09/22/17 1438    Clinical Impression Statement Pt had palpable tightness in Rt semimembranosus and glute med; reduced with stretches and manual therapy.  Pt's gait is less antalgic.  She tolerated all exercises well, with min increase in pain. Pt progressing towards goals.    Rehab Potential Good   PT Frequency 2x / week   PT Duration 3 weeks   PT Treatment/Interventions Moist Heat;Ultrasound;Therapeutic exercise;Dry needling;Taping;Vasopneumatic Device;Manual techniques;Neuromuscular re-education;Cryotherapy;Electrical Stimulation;Patient/family education;Iontophoresis /ml Dexamethasone   PT Next Visit Plan assess response to Rock tape. Retape if needed.  continue strengthening/Stretching of Rt LE.    Consulted and Agree with Plan of Care Patient      Patient will benefit from skilled therapeutic intervention in order to improve the following deficits and impairments:  Decreased range of motion, Pain, Decreased strength, Increased edema, Postural dysfunction, Difficulty walking  Visit Diagnosis: Acute pain of right knee  Muscle weakness (generalized)  Stiffness of right knee, not elsewhere classified     Problem List Patient Active Problem List   Diagnosis Date Noted  . Pain of right breast 07/07/2017  . Family history of breast cancer in mother 07/07/2017  . Abnormal weight gain 07/07/2017  . Obesity (BMI 30.0-34.9) 01/27/2017  . Low back sprain 07/28/2016  . C7 radiculopathy 12/01/2015  . Paresthesia of both hands 12/01/2015  . 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 Low back pain 02/19/2014  . Trochanteric bursitis of right hip 11/22/2013   . 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/2012  . 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 Herpes simplex 10/23/2012  . Allergic rhinitis 06/09/2011  . Asthma 06/09/2011  . GERD 06/09/2011   Mayer Camel, PTA 09/22/17 2:48 PM  [facility] [place] [address] [place], [place], [address] Phone: [phone]   Fax:  [phone]  Name: Angela Oliver MRN: [id number] Date of Birth: September 10, 1966

## 2017-09-23 ENCOUNTER — Other Ambulatory Visit: Payer: Self-pay | Admitting: *Deleted

## 2017-09-23 MED ORDER — PROPRANOLOL HCL 40 MG PO TABS: 40.0000 mg | ORAL_TABLET | Freq: Two times a day (BID) | ORAL | 1 refills | Status: DC

## 2017-09-26 ENCOUNTER — Encounter: Payer: Federal, State, Local not specified - PPO | Admitting: Physical Therapy

## 2017-09-28 ENCOUNTER — Other Ambulatory Visit: Payer: Self-pay | Admitting: Physician Assistant

## 2017-09-28 DIAGNOSIS — G43711 Chronic migraine without aura, intractable, with status migrainosus: Secondary | ICD-10-CM

## 2017-09-29 ENCOUNTER — Ambulatory Visit (INDEPENDENT_AMBULATORY_CARE_PROVIDER_SITE_OTHER): Payer: Federal, State, Local not specified - PPO | Admitting: Physical Therapy

## 2017-09-29 ENCOUNTER — Encounter: Payer: Self-pay | Admitting: Physical Therapy

## 2017-09-29 DIAGNOSIS — M25661 Stiffness of right knee, not elsewhere classified: Secondary | ICD-10-CM | POA: Diagnosis not present

## 2017-09-29 DIAGNOSIS — M6281 Muscle weakness (generalized): Secondary | ICD-10-CM

## 2017-09-29 DIAGNOSIS — M25561 Pain in right knee: Secondary | ICD-10-CM | POA: Diagnosis not present

## 2017-09-29 NOTE — Therapy (Signed)
[facility] [place] [address] [place], [place], [address] Phone: [phone]   Fax:  [phone]  Physical Therapy Treatment  Patient Details  Name: Angela Oliver MRN: [id number] Date of Birth: 03-Dec-1966 Referring Provider: Dr Denyse Amass  Encounter Date: 09/29/2017      PT End of Session - 09/29/17 1404    Visit Number 5   Number of Visits 6   Date for PT Re-Evaluation 10/03/17   PT Start Time 1404   PT Stop Time 1510   PT Time Calculation (min) 66 min   Activity Tolerance Patient tolerated treatment well      Past Medical History:  Diagnosis Date  . Anxiety   . Arthritis   . Asthma   . Complication of anesthesia    slow to awaken  . Family history of adverse reaction to anesthesia    mother slow to awaken  . GERD (gastroesophageal reflux disease)   . Herpes simplex   . Irregular heart beat    in the past- no palpations- "skipped a beat"  . Migraines   . Seasonal allergies     Past Surgical History:  Procedure Laterality Date  . APPENDECTOMY    . CHOLECYSTECTOMY  06/01/2016   laproscopic   . CHOLECYSTECTOMY N/A 06/01/2016   Procedure: LAPAROSCOPIC CHOLECYSTECTOMY WITH INTRAOPERATIVE CHOLANGIOGRAM, REMOVAL PERITONEAL NODULE ADJACENT TO SIGMOID COLON;  Surgeon: Avel Peace, MD;  Location: [facility] OR;  Service: General;  Laterality: N/A;  . COLONOSCOPY     age 51  . OVARIAN CYST REMOVAL Left    part of ovary- cyst hadf ruptured    There were no vitals filed for this visit.      Subjective Assessment - 09/29/17 1405    Subjective Pt reports the tape still helps. Her knee is getting better slowly.  She has been off and moving all week.  Pt requests to leave tape off for the weekend and see how the knee does without it.    Patient Stated Goals walk without pain and swelling, bend the knee through full motion without feeling pressure. Squat at work.   Currently in Pain? Yes   Pain Score 3    Pain Location Knee   Pain Orientation Right;Anterior  behind the knee cap   Pain Descriptors / Indicators Throbbing   Pain Type Acute pain   Pain Onset More than a month ago   Pain Frequency Intermittent   Aggravating Factors  over use   Pain Relieving Factors rest, tape and tens            [facility] PT Assessment - 09/29/17 0001      Assessment   Medical Diagnosis Rt knee pain   Referring Provider Dr Denyse Amass   Onset Date/Surgical Date 07/13/17     AROM   Right Knee Flexion 125                     [facility] Adult PT Treatment/Exercise - 09/29/17 0001      Self-Care   Self-Care Other Self-Care Comments   Other Self-Care Comments  instructed in self taping her knee     Knee/Hip Exercises: Stretches   Tax inspector reps  45 sec     Knee/Hip Exercises: Aerobic   Stationary Bike L2: 6 min      Knee/Hip Exercises: Standing   SLS Rt with FWD lean, 2 x10   Other Standing Knee Exercises 3x10 sit to stand to high table  Modalities   Modalities Counsellorlectrical Stimulation;Vasopneumatic     Electrical Stimulation   Electrical Stimulation Location Rt knee   Electrical Stimulation Action ion repelling   Electrical Stimulation Parameters to tolerance   Electrical Stimulation Goals Edema;Pain     Vasopneumatic   Number Minutes Vasopneumatic  15 minutes   Vasopnuematic Location  Knee   Vasopneumatic Pressure Low  3*   Vasopneumatic Temperature  3*     Manual Therapy   Other Manual Therapy held taping today, verbally instructed pt how to apply                PT Education - 09/29/17 1422    Education provided Yes   Education Details HEP   Person(s) Educated Patient   Methods Explanation;Demonstration;Handout   Comprehension Returned demonstration;Verbalized understanding             PT Long Term Goals - 09/29/17 1540      PT LONG TERM GOAL #1   Title I with advanced HEP (10/03/17)    Status On-going     PT LONG TERM GOAL #2   Title improve FOTO =/< 42%  limited (10/03/17)    Status On-going     PT LONG TERM GOAL #3   Title report =/> 75% reduction in Rt knee pain with functional activities and work ( 10/03/17)    Status On-going     PT LONG TERM GOAL #4   Title Rt LE strength =/> Lt to allow her to perform her work without pain or difficulty in her knees.    Status On-going               Plan - 09/29/17 1538    Clinical Impression Statement [name] is making slow progress.  She is feeling stronger and has increased strength.  Her ROM has stayed the same however with her activity level this week that is expected.  She is responding well to the tape and treatment, progressing to goals.    Rehab Potential Good   PT Frequency 2x / week   PT Duration 3 weeks   PT Treatment/Interventions Moist Heat;Ultrasound;Therapeutic exercise;Dry needling;Taping;Vasopneumatic Device;Manual techniques;Neuromuscular re-education;Cryotherapy;Electrical Stimulation;Patient/family education;Iontophoresis 4mg /ml Dexamethasone   PT Next Visit Plan cont with LE strengthening, proprioception, tape and modalites PRN, FOTO and reassess this week   Consulted and Agree with Plan of Care Patient      Patient will benefit from skilled therapeutic intervention in order to improve the following deficits and impairments:  Decreased range of motion, Pain, Decreased strength, Increased edema, Postural dysfunction, Difficulty walking  Visit Diagnosis: Acute pain of right knee  Muscle weakness (generalized)  Stiffness of right knee, not elsewhere classified     Problem List Patient Active Problem List   Diagnosis Date Noted  . Pain of right breast 07/07/2017  . Family history of breast cancer in mother 07/07/2017  . Abnormal weight gain 07/07/2017  . Obesity (BMI 30.0-34.9) 01/27/2017  . Low back sprain 07/28/2016  . C7 radiculopathy 12/01/2015  . Paresthesia of both hands 12/01/2015  . Intractable chronic migraine without aura and with status  migrainosus 10/02/2014  . Low back pain 02/19/2014  . Trochanteric bursitis of right hip 11/22/2013  . 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/2012  . 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 Herpes simplex 10/23/2012  . Allergic rhinitis 06/09/2011  . Asthma 06/09/2011  . GERD 06/09/2011    Darl PikesSusan shave rPT  09/29/2017, 3:41 PM  [facility] [address] [place], [place],  [address] Phone: [phone]   Fax:  [phone]  Name: Toshiye Kever MRN: [id number] Date of Birth: 07-06-1966

## 2017-09-29 NOTE — Patient Instructions (Addendum)
Functional Quadriceps: Sit to Stand - start lowering down to arm rest of couch, when this is easy work off chair.     Sit on edge of chair, feet flat on floor. Stand upright, extending knees fully. Repeat _10___ times per set. Do _3___ sets per session. Do _everyother _ day.  Balance: Unilateral - Forward Lean    Stand on right foot, hands on hips. Keeping hips level, bend forward as if to touch forehead to wall. Hold __1__ seconds. Relax. Repeat __10__ times per set. Do __3__ sets per session. Do _every other __ day.

## 2017-10-03 ENCOUNTER — Ambulatory Visit (INDEPENDENT_AMBULATORY_CARE_PROVIDER_SITE_OTHER): Payer: Federal, State, Local not specified - PPO | Admitting: Physical Therapy

## 2017-10-03 DIAGNOSIS — M25561 Pain in right knee: Secondary | ICD-10-CM | POA: Diagnosis not present

## 2017-10-03 DIAGNOSIS — M25661 Stiffness of right knee, not elsewhere classified: Secondary | ICD-10-CM

## 2017-10-03 DIAGNOSIS — M6281 Muscle weakness (generalized): Secondary | ICD-10-CM | POA: Diagnosis not present

## 2017-10-03 NOTE — Therapy (Signed)
[facility] [place] [address] [place], [place], [address] Phone: [phone]   Fax:  [phone]  Physical Therapy Treatment  Patient Details  Name: Angela Oliver MRN: [id number] Date of Birth: Apr 14, 1966 Referring Provider: Dr. Denyse Amass   Encounter Date: 10/03/2017      PT End of Session - 10/03/17 1410    Visit Number 6   Number of Visits 6   Date for PT Re-Evaluation 10/03/17   PT Start Time 1402   PT Stop Time 1449   PT Time Calculation (min) 47 min   Activity Tolerance Patient tolerated treatment well   Behavior During Therapy [facility] for tasks assessed/performed      Past Medical History:  Diagnosis Date  . Anxiety   . Arthritis   . Asthma   . Complication of anesthesia    slow to awaken  . Family history of adverse reaction to anesthesia    mother slow to awaken  . GERD (gastroesophageal reflux disease)   . Herpes simplex   . Irregular heart beat    in the past- no palpations- "skipped a beat"  . Migraines   . Seasonal allergies     Past Surgical History:  Procedure Laterality Date  . APPENDECTOMY    . CHOLECYSTECTOMY  06/01/2016   laproscopic   . CHOLECYSTECTOMY N/A 06/01/2016   Procedure: LAPAROSCOPIC CHOLECYSTECTOMY WITH INTRAOPERATIVE CHOLANGIOGRAM, REMOVAL PERITONEAL NODULE ADJACENT TO SIGMOID COLON;  Surgeon: Avel Peace, MD;  Location: [facility] OR;  Service: General;  Laterality: N/A;  . COLONOSCOPY     age 77  . OVARIAN CYST REMOVAL Left    part of ovary- cyst hadf ruptured    There were no vitals filed for this visit.      Subjective Assessment - 10/03/17 1407    Subjective Today was pt's first day back at work after 9 days off (vacation).  "I finally felt like I had gotten that golf ball feeling out from behind my knee".  She has been compliant with HEP.  "My knee feels like it is stronger".  Pt insterested in continuation of therapy.    Currently in Pain? Yes   Pain Score 3    Pain Location  Knee   Pain Orientation Right;Anterior   Pain Descriptors / Indicators Tightness   Aggravating Factors  prolonged standing   Pain Relieving Factors rest, TENS.            [facility] PT Assessment - 10/03/17 0001      Assessment   Medical Diagnosis Rt knee pain   Referring Provider Dr. Denyse Amass    Onset Date/Surgical Date 07/13/17   Next MD Visit after PT     Strength   Right Hip ABduction --  5-/5     Flexibility   Quadriceps Rt 122 deg          [facility] Adult PT Treatment/Exercise - 10/03/17 0001      Knee/Hip Exercises: Stretches   Passive Hamstring Stretch Right;Left;2 reps;30 seconds   Quad Stretch Right;2 reps  45 sec   Piriformis Stretch Right;Left;2 reps;30 seconds   Gastroc Stretch Both;2 reps;30 seconds     Knee/Hip Exercises: Aerobic   Stationary Bike L2: 3 min      Knee/Hip Exercises: Standing   Heel Raises Both;1 set;10 reps;2 seconds  heels off of step     Knee/Hip Exercises: Sidelying   Hip ABduction Strengthening;Right;1 set;10 reps;Left   Clams 2 sets of 10 - each leg, ball between feet  Theatre managerlectrical Stimulation   Electrical Stimulation Location Rt knee   Electrical Stimulation Action IFC   Electrical Stimulation Parameters to tolerance    Electrical Stimulation Goals Pain     Vasopneumatic   Number Minutes Vasopneumatic  15 minutes   Vasopnuematic Location  Knee   Vasopneumatic Pressure Low   Vasopneumatic Temperature  34 deg     Manual Therapy   Other Manual Therapy Rock tape applied to Rt knee - 2 strips, one placed medial / one placed lateral Rt knee with 20% stretch to decompress area, increase proprioception, decrease pain.                      PT Long Term Goals - 10/03/17 1415      PT LONG TERM GOAL #1   Title I with advanced HEP (10/03/17)    Time 3   Period Weeks   Status On-going     PT LONG TERM GOAL #2   Title improve FOTO =/< 42% limited (10/03/17)    Time 3   Period Weeks   Status On-going     PT LONG  TERM GOAL #3   Title report =/> 75% reduction in Rt knee pain with functional activities and work ( 10/03/17)    Time 3   Period Weeks   Status On-going  60% reduction - on 10/03/17     PT LONG TERM GOAL #4   Title Rt LE strength =/> Lt to allow her to perform her work without pain or difficulty in her knees.    Time 3   Period Weeks   Status On-going               Plan - 10/03/17 1423    Clinical Impression Statement [name] demonstrated improved Rt quad flexilbility and Rt hip abd strength. She reports 60% reduction in pain with functional activities.  She is making good gains towards goals and will benefit from continued PT intervention to reduce pain and improved functional mobility.    Rehab Potential Good   PT Frequency 2x / week   PT Duration 3 weeks   PT Treatment/Interventions Moist Heat;Ultrasound;Therapeutic exercise;Dry needling;Taping;Vasopneumatic Device;Manual techniques;Neuromuscular re-education;Cryotherapy;Electrical Stimulation;Patient/family education;Iontophoresis 4mg /ml Dexamethasone   PT Next Visit Plan Spoke to supervising PT; will request additional sessions from MD.   Will continue RLE proprioception, ROM, strengthening once approved.    Consulted and Agree with Plan of Care Patient      Patient will benefit from skilled therapeutic intervention in order to improve the following deficits and impairments:  Decreased range of motion, Pain, Decreased strength, Increased edema, Postural dysfunction, Difficulty walking  Visit Diagnosis: Acute pain of right knee  Muscle weakness (generalized)  Stiffness of right knee, not elsewhere classified     Problem List Patient Active Problem List   Diagnosis Date Noted  . Pain of right breast 07/07/2017  . Family history of breast cancer in mother 07/07/2017  . Abnormal weight gain 07/07/2017  . Obesity (BMI 30.0-34.9) 01/27/2017  . Low back sprain 07/28/2016  . C7 radiculopathy 12/01/2015  . Paresthesia  of both hands 12/01/2015  . 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 Low back pain 02/19/2014  . Trochanteric bursitis of right hip 11/22/2013  . 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/2012  . 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 Herpes simplex 10/23/2012  . Allergic rhinitis 06/09/2011  . Asthma 06/09/2011  . GERD 06/09/2011   Mayer CamelJennifer Carlson-Long, PTA 10/03/17 5:15 PM  [facility] [place] [address] [place], [place], [address] Phone: [phone]   Fax:  [phone]  Name: Guadalupe Kerekes MRN: [id number] Date of Birth: 05/04/1966

## 2017-10-06 ENCOUNTER — Encounter: Payer: Self-pay | Admitting: Physical Therapy

## 2017-10-06 ENCOUNTER — Ambulatory Visit (INDEPENDENT_AMBULATORY_CARE_PROVIDER_SITE_OTHER): Payer: Federal, State, Local not specified - PPO | Admitting: Physical Therapy

## 2017-10-06 DIAGNOSIS — M25561 Pain in right knee: Secondary | ICD-10-CM | POA: Diagnosis not present

## 2017-10-06 DIAGNOSIS — M6281 Muscle weakness (generalized): Secondary | ICD-10-CM

## 2017-10-06 DIAGNOSIS — M25661 Stiffness of right knee, not elsewhere classified: Secondary | ICD-10-CM

## 2017-10-06 NOTE — Therapy (Signed)
[facility] [address] [place], [place], [address] Phone: [phone]   Fax:  [phone]  Physical Therapy Treatment  Patient Details  Name: Angela AzureBrenda Oliver MRN: [id number] Date of Birth: 01/08/1966 Referring Provider: Dr Denyse AmassOrey  Encounter Date: 10/06/2017      PT End of Session - 10/06/17 1455    Visit Number 7   Number of Visits 14   Date for PT Re-Evaluation 11/03/17   PT Start Time 1453   PT Stop Time 1546   PT Time Calculation (min) 53 min      Past Medical History:  Diagnosis Date  . Anxiety   . Arthritis   . Asthma   . Complication of anesthesia    slow to awaken  . Family history of adverse reaction to anesthesia    mother slow to awaken  . GERD (gastroesophageal reflux disease)   . Herpes simplex   . Irregular heart beat    in the past- no palpations- "skipped a beat"  . Migraines   . Seasonal allergies     Past Surgical History:  Procedure Laterality Date  . APPENDECTOMY    . CHOLECYSTECTOMY  06/01/2016   laproscopic   . CHOLECYSTECTOMY N/A 06/01/2016   Procedure: LAPAROSCOPIC CHOLECYSTECTOMY WITH INTRAOPERATIVE CHOLANGIOGRAM, REMOVAL PERITONEAL NODULE ADJACENT TO SIGMOID COLON;  Surgeon: Avel Peaceodd Rosenbower, MD;  [facility];  Service: General;  Laterality: N/A;  . COLONOSCOPY     age 51  . OVARIAN CYST REMOVAL Left    part of ovary- cyst hadf ruptured    There were no vitals filed for this visit.      Subjective Assessment - 10/06/17 1500    Subjective Pt reports that today has been a really tough day, been very sore the last two days.  After work she has to take one step at a time for the last two days. Wants to try without tape as she feels like it is making it stiffness.    Patient Stated Goals walk without pain and swelling, bend the knee through full motion without feeling pressure. Squat at work.   Currently in Pain? Yes   Pain Score 6    Pain Location Knee   Pain Orientation  Right;Anterior   Pain Descriptors / Indicators Tightness   Pain Type Acute pain   Pain Onset More than a month ago   Pain Frequency Constant   Aggravating Factors  working and then stairs   Pain Relieving Factors rest and get off leg, ice            [facility] Assessment - 10/06/17 0001      Assessment   Medical Diagnosis Rt knee pain   Referring Provider Dr Denyse AmassOrey   Onset Date/Surgical Date 07/13/17     Observation/Other Assessments   Focus on Therapeutic Outcomes (FOTO)  53% limited     AROM   Right Knee Flexion 124     Strength   Right/Left Hip Right   Right Hip Flexion 5/5   Right Hip ABduction --  5-/5                     [facility]/Exercise - 10/06/17 0001      Knee/Hip Exercises: Stretches   LobbyistQuad Stretch Both;1 rep;30 seconds  at wall     Knee/Hip Exercises: Aerobic   Stationary Bike L2x6'     Knee/Hip Exercises: Standing   Lateral Step Up 3 sets;10 reps;Step Height: 6"  Forward Step Up Right;3 sets;10 reps;Step Height: 8";Step Height: 6"  with 6/10 pain on 8" step    Forward Step Up Limitations VC for form, knee was adducting     Knee/Hip Exercises: Supine   Short Arc Quad Sets Strengthening;Right;5 sets;10 reps     Modalities   Modalities Investment banker, corporate [employer]       Ultrasound   Ultrasound Location distal Rt knee   Ultrasound Parameters 50%, 1.81mHz, 1.0w/cm2   Ultrasound Goals Pain  tightness     Vasopneumatic   Number Minutes Vasopneumatic  15 minutes   Vasopnuematic Location  Knee   Vasopneumatic Pressure Medium   Vasopneumatic Temperature  3*                     PT Long Term Goals - 10/06/17 1509      PT LONG TERM GOAL #1   Title I with advanced HEP (11/03/17)    Time 4   Period Weeks   Status On-going     PT LONG TERM GOAL #2   Title improve FOTO =/< 42% limited (11/03/17)    Time 4   Period Weeks   Status On-going  53%  limited now, was 60% at eval     PT LONG TERM GOAL #3   Title report =/> 75% reduction in Rt knee pain with functional activities and work ( 11/03/17)    Time 4   Period Weeks   Status On-going  50% improvement     PT LONG TERM GOAL #4   Title Rt LE strength =/> Lt to allow her to perform her work without pain or difficulty in her knees. ( 11/03/17)    Time 4   Period Weeks   Status On-going               Plan - 10/06/17 1618    Clinical Impression Statement Kyrah demonstrates improved Rt quad flexibility and hip strength, she has 50-60% imporvement in her pain. However the last couple of days she has had increased pain and difficulty walking.  She is making gains to her goals and would benefit from more treatment to address her pain, swelling and weakness.    Rehab Potential Good   PT Frequency 2x / week   PT Duration 4 weeks   PT Treatment/Interventions Moist Heat;Ultrasound;Therapeutic exercise;Dry needling;Taping;Vasopneumatic Device;Manual techniques;Neuromuscular re-education;Cryotherapy;Electrical Stimulation;Patient/family education;Iontophoresis 4mg /ml Dexamethasone   PT Next Visit Plan assess response to ultrasound, agrressive quad strengthening   Consulted and Agree with Plan of Care Patient      Patient will benefit from skilled therapeutic intervention in order to improve the following deficits and impairments:  Decreased range of motion, Pain, Decreased strength, Increased edema, Postural dysfunction, Difficulty walking  Visit Diagnosis: Acute pain of right knee - Plan: PT plan of care cert/re-cert  Muscle weakness (generalized) - Plan: PT plan of care cert/re-cert  Stiffness of right knee, not elsewhere classified - Plan: PT plan of care cert/re-cert     Problem List Patient Active Problem List   Diagnosis Date Noted  . Pain of right breast 07/07/2017  . Family history of breast cancer in mother 07/07/2017  . Abnormal weight gain 07/07/2017  .  Obesity (BMI 30.0-34.9) 01/27/2017  . Low back sprain 07/28/2016  . C7 radiculopathy 12/01/2015  . Paresthesia of both hands 12/01/2015  . 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 Low back pain 02/19/2014  .  Trochanteric bursitis of right hip 11/22/2013  . 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/2012  . 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 Herpes simplex 10/23/2012  . Allergic rhinitis 06/09/2011  . Asthma 06/09/2011  . GERD 06/09/2011    Roderic Scarce PT  10/06/2017, 4:23 PM  [facility] [address] [place], [place], [address] Phone: [phone]   Fax:  [phone]  Name: Gradie Butrick MRN: [id number] Date of Birth: March 09, 1966

## 2017-10-10 ENCOUNTER — Ambulatory Visit (INDEPENDENT_AMBULATORY_CARE_PROVIDER_SITE_OTHER): Payer: Federal, State, Local not specified - PPO | Admitting: Physical Therapy

## 2017-10-10 DIAGNOSIS — M6281 Muscle weakness (generalized): Secondary | ICD-10-CM

## 2017-10-10 DIAGNOSIS — M25661 Stiffness of right knee, not elsewhere classified: Secondary | ICD-10-CM | POA: Diagnosis not present

## 2017-10-10 DIAGNOSIS — M25561 Pain in right knee: Secondary | ICD-10-CM

## 2017-10-10 NOTE — Therapy (Signed)
Dictation #1 [id number] [facility] [address] [place], [place], [address] Phone: [phone]   Fax:  [phone]  Physical Therapy Treatment  Patient Details  Name: Angela Oliver MRN: [id number] Date of Birth: 03/25/1966 Referring Provider: Dr. Denyse Amassorey  Encounter Date: 10/10/2017      PT End of Session - 10/10/17 1411    Visit Number 8   Number of Visits 14   Date for PT Re-Evaluation 11/03/17   PT Start Time 1403   PT Stop Time 1459   PT Time Calculation (min) 56 min   Behavior During Therapy [facility] for tasks assessed/performed      Past Medical History:  Diagnosis Date  . Anxiety   . Arthritis   . Asthma   . Complication of anesthesia    slow to awaken  . Family history of adverse reaction to anesthesia    mother slow to awaken  . GERD (gastroesophageal reflux disease)   . Herpes simplex   . Irregular heart beat    in the past- no palpations- "skipped a beat"  . Migraines   . Seasonal allergies     Past Surgical History:  Procedure Laterality Date  . APPENDECTOMY    . CHOLECYSTECTOMY  06/01/2016   laproscopic   . CHOLECYSTECTOMY N/A 06/01/2016   Procedure: LAPAROSCOPIC CHOLECYSTECTOMY WITH INTRAOPERATIVE CHOLANGIOGRAM, REMOVAL PERITONEAL NODULE ADJACENT TO SIGMOID COLON;  Surgeon: Avel Peaceodd Rosenbower, MD;  [facility];  Service: General;  Laterality: N/A;  . COLONOSCOPY     age 51  . OVARIAN CYST REMOVAL Left    part of ovary- cyst hadf ruptured    There were no vitals filed for this visit.      Subjective Assessment - 10/10/17 1413    Subjective Pt reported that the US done on her knee last visit helped decrease the pain in her Rt ant knee.  Overall her Rt knee is feeling better than last visit.    Patient Stated Goals walk without pain and swelling, bend the knee through full motion without feeling pressure. Squat at work.   Currently in Pain? Yes   Pain Score 4    Pain  Location Knee   Pain Orientation Right;Posterior   Pain Descriptors / Indicators Dull   Aggravating Factors  stairs   Pain Relieving Factors rest, getting off of leg.             [facility] - 10/10/17 0001      Assessment   Medical Diagnosis Rt knee pain   Referring Provider Dr. Denyse Amassorey   Onset Date/Surgical Date 07/13/17                     [facility] PT Treatment/Exercise - 10/10/17 0001      Knee/Hip Exercises: Stretches   Passive Hamstring Stretch Right;2 reps;60 seconds   Quad Stretch Right;2 reps;60 seconds   Gastroc Stretch Both;2 reps;30 seconds     Knee/Hip Exercises: Aerobic   Stationary Bike L2x6'     Knee/Hip Exercises: Standing   SLS with Vectors Rt/Lt SLS on Bosu x 30 sec with occasional UE support, 2 reps each leg.  Rt/Lt leg forward leans to chair seat x 5 reps, 2 sets   Rt forward leans caused Rt LBP - stopped   Other Standing Knee Exercises forward step ups onto Bosu x 3 reps with RLE; increased pain and crepitus, stopped.       Modalities   Modalities Cryotherapy;Ultrasound  pt declined; will ice at home.      Cryotherapy   Number Minutes Cryotherapy 15 Minutes   Cryotherapy Location Lumbar Spine  (during manual therapy for Rt knee)   Type of Cryotherapy Ice pack     Ultrasound   Ultrasound Location distal posterior Rt knee   Ultrasound Parameters 50%, 1.2 w/cm2, 8 min    Ultrasound Goals Pain     Manual Therapy   Manual Therapy Myofascial release;Soft tissue mobilization   Soft tissue mobilization STM to Rt distal to mid bicep femoris, semitendinosus, Cross fiber frictio to Rt ITB.    Myofascial Release MFR to Rt mid/lateral quad                      PT Long Term Goals - 10/10/17 1428      PT LONG TERM GOAL #1   Title I with advanced HEP (11/03/17)    Time 4   Period Weeks   Status On-going     PT LONG TERM GOAL #2   Title improve FOTO =/< 42% limited (11/03/17)    Time 4   Period Weeks   Status  On-going     PT LONG TERM GOAL #3   Title report =/> 75% reduction in Rt knee pain with functional activities and work ( 11/03/17)    Time 4   Period Weeks   Status On-going     PT LONG TERM GOAL #4   Title Rt LE strength =/> Lt to allow her to perform her work without pain or difficulty in her knees. ( 11/03/17)    Time 4   Period Weeks   Status On-going               Plan - 10/10/17 1513    Clinical Impression Statement Pt had some palpable fascial restrictions in Rt quad / Rt lateral hamstring with manual therapy; decreased at end of session. Pt reported increased LBP with forward leans today; reduced with use of ice to LB during manual therapy for knee. Pt reported decrease in pain in Rt knee at end of session.    Rehab Potential Good   PT Frequency 2x / week   PT Duration 4 weeks   PT Treatment/Interventions Moist Heat;Ultrasound;Therapeutic exercise;Dry needling;Taping;Vasopneumatic Device;Manual techniques;Neuromuscular re-education;Cryotherapy;Electrical Stimulation;Patient/family education;Iontophoresis 4mg /ml Dexamethasone   PT Next Visit Plan continue Rt quad strengthening, LE stretches.    PT Home Exercise Plan added self massage to Rt quad/hamstring, ice, continued stretches for LE.    Consulted and Agree with Plan of Care Patient      Patient will benefit from skilled therapeutic intervention in order to improve the following deficits and impairments:  Decreased range of motion, Pain, Decreased strength, Increased edema, Postural dysfunction, Difficulty walking  Visit Diagnosis: Acute pain of right knee  Muscle weakness (generalized)  Stiffness of right knee, not elsewhere classified     Problem List Patient Active Problem List   Diagnosis Date Noted  . Pain of right breast 07/07/2017  . Family history of breast cancer in mother 07/07/2017  . Abnormal weight gain 07/07/2017  . Obesity (BMI 30.0-34.9) 01/27/2017  . Low back sprain 07/28/2016  . C7  radiculopathy 12/01/2015  . Paresthesia of both hands 12/01/2015  . 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 Low back pain 02/19/2014  . Trochanteric bursitis of right hip 11/22/2013  . 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/2012  . 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 Herpes simplex 10/23/2012  .  Allergic rhinitis 06/09/2011  . Asthma 06/09/2011  . GERD 06/09/2011   Angela Oliver, PTA 10/10/17 3:18 PM  [facility] [place] [address] [place], [place], [address] Phone: [phone]   Fax:  [phone]  Name: Angela Oliver MRN: [id number] Date of Birth: 03-Jan-1966

## 2017-10-13 ENCOUNTER — Ambulatory Visit (INDEPENDENT_AMBULATORY_CARE_PROVIDER_SITE_OTHER): Payer: Federal, State, Local not specified - PPO | Admitting: Physical Therapy

## 2017-10-13 DIAGNOSIS — M6281 Muscle weakness (generalized): Secondary | ICD-10-CM | POA: Diagnosis not present

## 2017-10-13 DIAGNOSIS — M25561 Pain in right knee: Secondary | ICD-10-CM

## 2017-10-13 DIAGNOSIS — M25661 Stiffness of right knee, not elsewhere classified: Secondary | ICD-10-CM | POA: Diagnosis not present

## 2017-10-13 NOTE — Therapy (Signed)
[facility] [place] [address] [place], [place], [address] Phone: [phone]   Fax:  [phone]  Physical Therapy Treatment  Patient Details  Name: Angela Oliver MRN: [id number] Date of Birth: March 12, 1966 Referring Provider: Dr. Denyse Amass   Encounter Date: 10/13/2017      PT End of Session - 10/13/17 1409    Visit Number 9   Number of Visits 14   Date for PT Re-Evaluation 11/03/17   PT Start Time 1402   PT Stop Time 1500   PT Time Calculation (min) 58 min   Activity Tolerance Patient tolerated treatment well   Behavior During Therapy [facility] for tasks assessed/performed      Past Medical History:  Diagnosis Date  . Anxiety   . Arthritis   . Asthma   . Complication of anesthesia    slow to awaken  . Family history of adverse reaction to anesthesia    mother slow to awaken  . GERD (gastroesophageal reflux disease)   . Herpes simplex   . Irregular heart beat    in the past- no palpations- "skipped a beat"  . Migraines   . Seasonal allergies     Past Surgical History:  Procedure Laterality Date  . APPENDECTOMY    . CHOLECYSTECTOMY  06/01/2016   laproscopic   . CHOLECYSTECTOMY N/A 06/01/2016   Procedure: LAPAROSCOPIC CHOLECYSTECTOMY WITH INTRAOPERATIVE CHOLANGIOGRAM, REMOVAL PERITONEAL NODULE ADJACENT TO SIGMOID COLON;  Surgeon: Avel Peace, MD;  Location: [facility] OR;  Service: General;  Laterality: N/A;  . COLONOSCOPY     age 47  . OVARIAN CYST REMOVAL Left    part of ovary- cyst hadf ruptured    There were no vitals filed for this visit.      Subjective Assessment - 10/13/17 1406    Subjective "It's not achy today, it just feels more restrictive".  She reports her Rt knee is not as painful as yesterday, but she also hasn't been on it. She called into work due to migrane headache.    Patient Stated Goals walk without pain and swelling, bend the knee through full motion without feeling pressure. Squat at work.    Currently in Pain? Yes   Pain Score 4    Pain Location Knee   Pain Orientation Right;Lateral   Pain Descriptors / Indicators Stabbing   Aggravating Factors  stairs   Pain Relieving Factors rest, getting off of leg.             [facility] PT Assessment - 10/13/17 0001      Assessment   Medical Diagnosis Rt knee pain   Referring Provider Dr. Denyse Amass    Onset Date/Surgical Date 07/13/17   Next MD Visit after PT     AROM   Right Knee Flexion 124     Strength   Right/Left Knee Right   Right Knee Flexion 4+/5   Right Knee Extension 5/5     Flexibility   Quadriceps Rt 117 deg          [facility] Adult PT Treatment/Exercise - 10/13/17 0001      Knee/Hip Exercises: Stretches   Passive Hamstring Stretch Right;2 reps;30 seconds   Passive Hamstring Stretch Limitations Rt adductor stretch x 30 sec   Quad Stretch Right;Left;30 seconds;2 reps   Gastroc Stretch Right;Left;2 reps;30 seconds     Knee/Hip Exercises: Aerobic   Stationary Bike L2x6'     Knee/Hip Exercises: Standing   SLS with Vectors Rt/Lt forward leans to chair seat x 5  reps, 2 sets    Other Standing Knee Exercises hamstring curls RLE without weight x 10, #3 x 15 reps      Knee/Hip Exercises: Supine   Straight Leg Raise with External Rotation Right;1 set;15 reps     Knee/Hip Exercises: Prone   Hamstring Curl 1 set;15 reps  3#     Ultrasound   Ultrasound Location Rt ant/lateral knee    Ultrasound Parameters 50%, 1.2 w/cm2, 8 min    Ultrasound Goals Pain     Vasopneumatic   Number Minutes Vasopneumatic  15 minutes   Vasopnuematic Location  Knee   Vasopneumatic Pressure Medium   Vasopneumatic Temperature  34 deg                     PT Long Term Goals - 10/13/17 1410      PT LONG TERM GOAL #1   Title I with advanced HEP (11/03/17)    Time 4   Period Weeks   Status On-going     PT LONG TERM GOAL #2   Title improve FOTO =/< 42% limited (11/03/17)    Time 4   Period Weeks   Status On-going      PT LONG TERM GOAL #3   Title report =/> 75% reduction in Rt knee pain with functional activities and work ( 11/03/17)    Time 4   Period Weeks   Status --  60-65% improvement     PT LONG TERM GOAL #4   Title Rt LE strength =/> Lt to allow her to perform her work without pain or difficulty in her knees. ( 11/03/17)    Time 4   Period Weeks   Status On-going               Plan - 10/13/17 1430    Clinical Impression Statement Pt progressing towards LTG#3 with improvement in Rt knee pain.  Pt demonstrated weakness in Rt hamstring.  Quad flexibility decreased since last assessment.  Pt has made good gains since initiating therapy, but continues to have fluctuating knee swelling/pain dependent on activity load at work.  Recommended pt schedule follow up with MD soon.    Rehab Potential Good   PT Frequency 2x / week   PT Duration 4 weeks   PT Treatment/Interventions Moist Heat;Ultrasound;Therapeutic exercise;Dry needling;Taping;Vasopneumatic Device;Manual techniques;Neuromuscular re-education;Cryotherapy;Electrical Stimulation;Patient/family education;Iontophoresis 4mg /ml Dexamethasone   PT Next Visit Plan continue Rt quad strengthening, LE stretches.       Patient will benefit from skilled therapeutic intervention in order to improve the following deficits and impairments:  Decreased range of motion, Pain, Decreased strength, Increased edema, Postural dysfunction, Difficulty walking  Visit Diagnosis: Acute pain of right knee  Muscle weakness (generalized)  Stiffness of right knee, not elsewhere classified     Problem List Patient Active Problem List   Diagnosis Date Noted  . Pain of right breast 07/07/2017  . Family history of breast cancer in mother 07/07/2017  . Abnormal weight gain 07/07/2017  . Obesity (BMI 30.0-34.9) 01/27/2017  . Low back sprain 07/28/2016  . C7 radiculopathy 12/01/2015  . Paresthesia of both hands 12/01/2015  . Intractable chronic migraine  without aura and with status migrainosus 10/02/2014  . Low back pain 02/19/2014  . Trochanteric bursitis of right hip 11/22/2013  . 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/2012  . 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 Herpes simplex 10/23/2012  . Allergic rhinitis 06/09/2011  . Asthma 06/09/2011  . GERD 06/09/2011   Mayer CamelJennifer Carlson-Long, PTA 10/13/17 5:45  PM   [facility] [address] [place], [place], [address] Phone: [phone]   Fax:  [phone]  Name: Kyndra Condron MRN: [id number] Date of Birth: 11-29-1966

## 2017-10-17 ENCOUNTER — Encounter: Payer: Self-pay | Admitting: Family Medicine

## 2017-10-17 ENCOUNTER — Ambulatory Visit: Payer: Federal, State, Local not specified - PPO | Admitting: Family Medicine

## 2017-10-17 ENCOUNTER — Encounter: Payer: Federal, State, Local not specified - PPO | Admitting: Physical Therapy

## 2017-10-17 VITALS — BP 126/76 | HR 78 | Wt 195.0 lb

## 2017-10-17 DIAGNOSIS — M25561 Pain in right knee: Secondary | ICD-10-CM | POA: Diagnosis not present

## 2017-10-17 NOTE — Patient Instructions (Signed)
Thank you for coming in today. Let me know about the MRI.,  Recheck with me as needed.  Take Meloxicam daily.  Use the diclofenac gel for pain 4x daily.    Meniscus Tear A meniscus tear is a knee injury in which a piece of the meniscus is torn. The meniscus is a thick, rubbery, wedge-shaped cartilage in the knee. Two menisci are located in each knee. They sit between the upper bone (femur) and lower bone (tibia) that make up the knee joint. Each meniscus acts as a shock absorber for the knee. A torn meniscus is one of the most common types of knee injuries. This injury can range from mild to severe. Surgery may be needed for a severe tear. What are the causes? This injury may be caused by any squatting, twisting, or pivoting movement. Sports-related injuries are the most common cause. These often occur from:  Running and stopping suddenly.  Changing direction.  Being tackled or knocked off your feet.  As people get older, their meniscus gets thinner and weaker. In these people, tears can happen more easily, such as from climbing stairs. What increases the risk? This injury is more likely to happen to:  People who play contact sports.  Males.  People who are 53 years of age of age.  What are the signs or symptoms? Symptoms of this injury include:  Knee pain, especially at the side of the knee joint. You may feel pain when the injury occurs, or you may only hear a pop and feel pain later.  A feeling that your knee is clicking, catching, locking, or giving way.  Not being able to fully bend or extend your knee.  Bruising or swelling in your knee.  How is this diagnosed? This injury may be diagnosed based on your symptoms and a physical exam. The physical exam may include:  Moving your knee in different ways.  Feeling for tenderness.  Listening for a clicking sound.  Checking if your knee locks or catches.  You may also have tests, such as:  X-rays.  MRI.  A procedure  to look inside your knee with a narrow surgical telescope (arthroscopy).  You may be referred to a knee specialist (orthopedic surgeon). How is this treated? Treatment for this injury depends on the severity of the tear. Treatment for a mild tear may include:  Rest.  Medicine to reduce pain and swelling. This is usually a nonsteroidal anti-inflammatory drug (NSAID).  A knee brace or an elastic sleeve or wrap.  Using crutches or a walker to keep weight off your knee and to help you walk.  Exercises to strengthen your knee (physical therapy).  You may need surgery if you have a severe tear or if other treatments are not working. Follow these instructions at home: Managing pain and swelling  Take over-the-counter and prescription medicines only as told by your health care provider.  If directed, apply ice to the injured area: ? Put ice in a plastic bag. ? Place a towel between your skin and the bag. ? Leave the ice on for 20 minutes, 2-3 times per day.  Raise (elevate) the injured area above the level of your heart while you are sitting or lying down. Activity  Do not use the injured limb to support your body weight until your health care provider says that you can. Use crutches or a walker as told by your health care provider.  Return to your normal activities as told by your health care provider. Ask  your health care provider what activities are safe for you.  Perform range-of-motion exercises only as told by your health care provider.  Begin doing exercises to strengthen your knee and leg muscles only as told by your health care provider. After you recover, your health care provider may recommend these exercises to help prevent another injury. General instructions  Use a knee brace or elastic wrap as told by your health care provider.  Keep all follow-up visits as told by your health care provider. This is important. Contact a health care provider if:  You have a  fever.  Your knee becomes red, tender, or swollen.  Your pain medicine is not helping.  Your symptoms get worse or do not improve after 2 weeks of home care. This information is not intended to replace advice given to you by your health care provider. Make sure you discuss any questions you have with your health care provider. Document Released: 02/19/2003 Document Revised: 05/06/2016 Document Reviewed: 03/24/2015 Elsevier Interactive Patient Education  [employer]2018 [employer]

## 2017-10-18 NOTE — Progress Notes (Signed)
Angela Oliver is a 51 y.o. female who [facility] today for right knee pain.  Patient was seen August night for right knee pain.  During that visit she received a steroid injection in the additionally she has attended physical therapy.  She notes improved symptoms but still persistent symptoms.  She has pressure pain and stiffness.  She also has crepitations and popping.  She denies severe locking or catching.  She denies fevers chills nausea vomiting or diarrhea.  Past Medical History:  Diagnosis Date  . Anxiety   . Arthritis   . Asthma   . Complication of anesthesia    slow to awaken  . Family history of adverse reaction to anesthesia    mother slow to awaken  . GERD (gastroesophageal reflux disease)   . Herpes simplex   . Irregular heart beat    in the past- no palpations- "skipped a beat"  . Migraines   . Seasonal allergies    Past Surgical History:  Procedure Laterality Date  . APPENDECTOMY    . CHOLECYSTECTOMY  06/01/2016   laproscopic   . COLONOSCOPY     age 51  . OVARIAN CYST REMOVAL Left    part of ovary- cyst hadf ruptured   Social History   Tobacco Use  . Smoking status: Never Smoker  . Smokeless tobacco: Never Used  Substance Use Topics  . Alcohol use: No     ROS:  As above   Medications: Current Outpatient Medications  Medication Sig Dispense Refill  . acyclovir (ZOVIRAX) 400 MG tablet TAKE 1 TABLET BY MOUTH 2 TIMES DAILY. 60 tablet 0  . Albuterol Sulfate (PROAIR RESPICLICK) 108 (90 BASE) MCG/ACT AEPB Inhale 2 puffs into the lungs every 4 (four) hours as needed. (Patient taking differently: Inhale 2 puffs into the lungs every 4 (four) hours as needed (shortness of breath/ wheezing). ) 1 each 2  . cetirizine (ZYRTEC) 10 MG tablet Take 10 mg by mouth daily.    Marland Kitchen. CONTRAVE 8-90 MG TB12 TAKE 1 TAB DAILY FOR 7 DAYS, THEN 1 TWICE A DAY FOR 7 DAYS, THEN 2 IN THE AM AND 1 IN THE PM FOR 7 DAYS THEN TAKE 2  TWICE DAILY THEREAFTER. 120 tablet 3  . diclofenac sodium (VOLTAREN) 1 % GEL Apply 4 g topically 4 (four) times daily. To affected joint. 100 g 11  . meloxicam (MOBIC) 15 MG tablet TAKE 1 TO 2 TABLETS BY MOUTH EVERY DAY FOR 2 WEEKS THEN AS NEEDED FOR JOINT PAIN 90 tablet 2  . montelukast (SINGULAIR) 10 MG tablet TAKE 1 TABLET (10 MG TOTAL) BY MOUTH DAILY. 90 tablet 3  . omeprazole (PRILOSEC) 40 MG capsule Take 1 capsule (40 mg total) by mouth daily. 90 capsule 1  . propranolol (INDERAL) 40 MG tablet Take 1 tablet (40 mg total) by mouth 2 (two) times daily. Due for follow up 180 tablet 1  . QVAR 80 MCG/ACT inhaler INHALE 2 PUFFS INTO THE LUNGS 2 (TWO) TIMES DAILY. 8.7 g 4  . sertraline (ZOLOFT) 50 MG tablet TAKE 1 TABLET BY MOUTH EVERY DAY. ** PATIENT NEEDS TO SCHEDULE FOLLOW UP APPOINTMENT FOR MORE REFILL 30 tablet 0  . TROKENDI XR 25 MG CP24 TAKE ONE CAPSULE EVERY DAY 30 capsule 0   No current facility-administered medications for this visit.    Allergies  Allergen Reactions  . Sulfa Antibiotics Hives, Shortness Of Breath, Swelling and Rash    Throat swelling  . Sulfonamide  Derivatives Hives, Shortness Of Breath, Swelling and Rash    Throat swelling  . Topamax [Topiramate] Other (See Comments)    Severe stomach pain     Exam:  BP 126/76   Pulse 78   Wt 195 lb (88.5 kg)   BMI 33.47 kg/m  General: Well Developed, well nourished, and in no acute distress.  Neuro/Psych: Alert and oriented x3, extra-ocular muscles intact, able to move all 4 extremities, sensation grossly intact. Skin: Warm and dry, no rashes noted.  Respiratory: Not using accessory muscles, speaking in full sentences, trachea midline.  Cardiovascular: Pulses palpable, no extremity edema. Abdomen: Does not appear distended. MSK: Right knee mild effusion no erythema.  Nontender normal motion crepitations on exam.  Stable ligamentous exam.    No results found for this or any previous visit (from the past 48  hour(s)). No results found.    Assessment and Plan: 51 y.o. female with right knee pain concerning for meniscus injury based on duration of symptoms.  Patient has had some success with conservative management but not full success.  We discussed options.  Patient would like to continue conservative management.  She notes that she will make her mind up about whether or not she will proceed with MRI for surgical planning in the future.  Recheck as needed.    No orders of the defined types were placed in this encounter.  No orders of the defined types were placed in this encounter.   Discussed warning signs or symptoms. Please see discharge instructions. Patient expresses understanding.

## 2017-10-20 ENCOUNTER — Encounter: Payer: Self-pay | Admitting: Physical Therapy

## 2017-10-20 ENCOUNTER — Ambulatory Visit (INDEPENDENT_AMBULATORY_CARE_PROVIDER_SITE_OTHER): Payer: Federal, State, Local not specified - PPO | Admitting: Physical Therapy

## 2017-10-20 DIAGNOSIS — M25661 Stiffness of right knee, not elsewhere classified: Secondary | ICD-10-CM

## 2017-10-20 DIAGNOSIS — M25561 Pain in right knee: Secondary | ICD-10-CM | POA: Diagnosis not present

## 2017-10-20 DIAGNOSIS — M6281 Muscle weakness (generalized): Secondary | ICD-10-CM | POA: Diagnosis not present

## 2017-10-20 NOTE — Therapy (Signed)
[address] [place], [place], [address] Phone: [phone]   Fax:  [phone]  Physical Therapy Treatment  Patient Details  Name: Angela Oliver MRN: [id number] Date of Birth: October 29, 1966 Referring Provider: Dr Georgina Snell   Encounter Date: 10/20/2017  PT End of Session - 10/20/17 1410    Visit Number  10    Number of Visits  14    Date for PT Re-Evaluation  11/03/17    PT Start Time  9892    PT Stop Time  1501    PT Time Calculation (min)  58 min    Activity Tolerance  Patient limited by pain       Past Medical History:  Diagnosis Date  . Anxiety   . Arthritis   . Asthma   . Complication of anesthesia    slow to awaken  . Family history of adverse reaction to anesthesia    mother slow to awaken  . GERD (gastroesophageal reflux disease)   . Herpes simplex   . Irregular heart beat    in the past- no palpations- "skipped a beat"  . Migraines   . Seasonal allergies     Past Surgical History:  Procedure Laterality Date  . APPENDECTOMY    . CHOLECYSTECTOMY  06/01/2016   laproscopic   . COLONOSCOPY     age 26  . OVARIAN CYST REMOVAL Left    part of ovary- cyst hadf ruptured    There were no vitals filed for this visit.  Subjective Assessment - 10/20/17 1407    Subjective  Hassan Rowan saw the MD and he wants her to have an MRI, she is hesitant due to the cost.  She reports she was at work today and the knee is very sore,  feels like it doesn't want to bend.     Patient Stated Goals  walk without pain and swelling, bend the knee through full motion without feeling pressure. Squat at work.    Currently in Pain?  Yes    Pain Score  7     Pain Location  Knee    Pain Orientation  Right    Pain Descriptors / Indicators  Aching;Sharp;Tightness    Pain Type  Acute pain    Pain Onset  More than a month ago    Pain Frequency  Constant    Aggravating Factors   being on her feet and stairs    Pain Relieving  Factors  resting.          [facility] PT Assessment - 10/20/17 0001      Assessment   Medical Diagnosis  Rt knee pain    Referring Provider  Dr Georgina Snell      Observation/Other Assessments   Focus on Therapeutic Outcomes (FOTO)   60% limited      AROM   Right Knee Extension  -5    Right Knee Flexion  131      Strength   Right Hip Flexion  5/5    Right Hip ABduction  5/5    Right Knee Flexion  5/5 pain at tibial tuberosity    Right Knee Extension  5/5      Palpation   Palpation comment  palpable clicking at lateral tibial plateau with moving patella medially                   [facility] PT Treatment/Exercise - 10/20/17 0001      Knee/Hip Exercises: Stretches   ITB  Stretch  30 seconds;Both      Knee/Hip Exercises: Aerobic   Stationary Bike  L3x6'      Knee/Hip Exercises: Supine   Quad Sets  Strengthening;Right;10 reps 10 sec holds    Single Leg Bridge  Right;Strengthening;3 sets;10 reps figure 4      Ultrasound   Ultrasound Location  Rt ant/lateral knee    Ultrasound Parameters  50% 1.17mz, 1.0w/cm2, 8 min    Ultrasound Goals  Pain      Vasopneumatic   Number Minutes Vasopneumatic   15 minutes    Vasopnuematic Location   Knee    Vasopneumatic Pressure  Medium    Vasopneumatic Temperature   3*                  PT Long Term Goals - 10/13/17 1410      PT LONG TERM GOAL #1   Title  I with advanced HEP (11/03/17)     Time  4    Period  Weeks    Status  On-going      PT LONG TERM GOAL #2   Title  improve FOTO =/< 42% limited (11/03/17)     Time  4    Period  Weeks    Status  On-going      PT LONG TERM GOAL #3   Title  report =/> 75% reduction in Rt knee pain with functional activities and work ( 11/03/17)     Time  4    Period  Weeks    Status  -- 60-65% improvement      PT LONG TERM GOAL #4   Title  Rt LE strength =/> Lt to allow her to perform her work without pain or difficulty in her knees. ( 11/03/17)     Time  4    Period  Weeks     Status  On-going              Patient will benefit from skilled therapeutic intervention in order to improve the following deficits and impairments:     Visit Diagnosis: Acute pain of right knee  Muscle weakness (generalized)  Stiffness of right knee, not elsewhere classified     Problem List Patient Active Problem List   Diagnosis Date Noted  . Pain of right breast 07/07/2017  . Family history of breast cancer in mother 07/07/2017  . Abnormal weight gain 07/07/2017  . Obesity (BMI 30.0-34.9) 01/27/2017  . Low back sprain 07/28/2016  . C7 radiculopathy 12/01/2015  . Paresthesia of both hands 12/01/2015  . 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 Low back pain 02/19/2014  . Trochanteric bursitis of right hip 11/22/2013  . 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/2012  . 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 Herpes simplex 10/23/2012  . Allergic rhinitis 06/09/2011  . Asthma 06/09/2011  . GERD 06/09/2011    SJeral PinchPT 10/20/2017, 5:44 PM  [facility] [address]Phone: [phone]  Fax:  [phone] Name: BBlanchie ZeleznikMRN: [id number] of Birth: [birth year]  PHYSICAL THERAPY DISCHARGE SUMMARY  Visits from Start of Care: 10  Current functional level related to goals / functional outcomes: See above for current measurements   Remaining deficits: BGiavannihas made strength gains however she still has pain, catching in the knee and is limited in her mobility.  She is going to have an MRI as progress with conservative treatment is  not working.    Education / Equipment: HEP Plan: Patient agrees to discharge.  Patient goals were not met. Patient is being discharged due to a change in medical status.  ?????having further diagnostic performed. Jeral Pinch, PT 10/20/17 5:46 PM

## 2017-10-24 ENCOUNTER — Encounter: Payer: Federal, State, Local not specified - PPO | Admitting: Physical Therapy

## 2017-10-27 ENCOUNTER — Encounter: Payer: Federal, State, Local not specified - PPO | Admitting: Physical Therapy

## 2017-11-13 ENCOUNTER — Other Ambulatory Visit: Payer: Self-pay | Admitting: Sports Medicine

## 2017-11-29 ENCOUNTER — Encounter: Payer: Self-pay | Admitting: Sports Medicine

## 2017-11-29 ENCOUNTER — Ambulatory Visit: Payer: Federal, State, Local not specified - PPO | Admitting: Sports Medicine

## 2017-11-29 ENCOUNTER — Ambulatory Visit (INDEPENDENT_AMBULATORY_CARE_PROVIDER_SITE_OTHER): Payer: Federal, State, Local not specified - PPO

## 2017-11-29 DIAGNOSIS — M545 Low back pain, unspecified: Secondary | ICD-10-CM

## 2017-11-29 DIAGNOSIS — G5603 Carpal tunnel syndrome, bilateral upper limbs: Secondary | ICD-10-CM | POA: Diagnosis not present

## 2017-11-29 DIAGNOSIS — M50121 Cervical disc disorder at C4-C5 level with radiculopathy: Secondary | ICD-10-CM

## 2017-11-29 DIAGNOSIS — M50222 Other cervical disc displacement at C5-C6 level: Secondary | ICD-10-CM | POA: Diagnosis not present

## 2017-11-29 DIAGNOSIS — M5412 Radiculopathy, cervical region: Secondary | ICD-10-CM | POA: Diagnosis not present

## 2017-11-29 DIAGNOSIS — M5136 Other intervertebral disc degeneration, lumbar region: Secondary | ICD-10-CM

## 2017-11-29 DIAGNOSIS — M50221 Other cervical disc displacement at C4-C5 level: Secondary | ICD-10-CM | POA: Diagnosis not present

## 2017-11-29 DIAGNOSIS — G8929 Other chronic pain: Secondary | ICD-10-CM

## 2017-11-29 MED ORDER — CELECOXIB 200 MG PO CAPS: ORAL_CAPSULE | ORAL | 2 refills | Status: DC

## 2017-11-29 MED ORDER — ACYCLOVIR 400 MG PO TABS
400.0000 mg | ORAL_TABLET | Freq: Two times a day (BID) | ORAL | 0 refills | Status: DC
Start: 2017-11-29 — End: 2017-12-26

## 2017-11-29 MED ORDER — DULOXETINE HCL 30 MG PO CPEP: 30.0000 mg | ORAL_CAPSULE | Freq: Every day | ORAL | 3 refills | Status: DC

## 2017-11-29 NOTE — Assessment & Plan Note (Signed)
I think the paresthesias into her hands are more related to carpal tunnel syndrome at night, first 4 fingers. She should get Velcro braces and wear them nightly.

## 2017-11-29 NOTE — Progress Notes (Signed)
Subjective:    I'm seeing this patient as a consultation for: Angela GawJade Breeback, PA-C  CC: Neck, back, shoulder pain  HPI: This is a pleasant 51 year old female, [place], for [place] months now she has had worsening neck pain radiating into the shoulder, as well as the hands and fingertips, low back pain without radiation, worse with sitting, flexion, Valsalva.  Bilateral hip and shoulder girdle pain.  No trauma, she is tearful in the exam room.  No bowel or bladder dysfunction, saddle numbness, constitutional symptoms, no progressive weakness.  She tells me that she has a history of thoracic outlet syndrome confirmed on a nerve conduction study years ago, I have requested these records.  She is also having paresthesias on the left hand, first through fourth fingers, worse in the morning.  Past medical history, Surgical history, Family history not pertinant except as noted below, Social history, Allergies, and medications have been entered into the medical record, reviewed, and no changes needed.   (To billers/coders, pertinent past medical, social, surgical, family history can be found in problem list, if problem list is marked as reviewed then this indicates that past medical, social, surgical, family history was also reviewed)  Review of Systems: No headache, visual changes, nausea, vomiting, diarrhea, constipation, dizziness, abdominal pain, skin rash, fevers, chills, night sweats, weight loss, swollen lymph nodes, body aches, joint swelling, muscle aches, chest pain, shortness of breath, mood changes, visual or auditory hallucinations.   Objective:   General: Well Developed, well nourished, and in no acute distress.  Neuro:  Extra-ocular muscles intact, able to move all 4 extremities, sensation grossly intact.  Deep tendon reflexes tested were normal. Psych: Alert and oriented, mood congruent with affect. ENT:  Ears and nose appear unremarkable.  Hearing grossly  normal. Neck: Unremarkable overall appearance, trachea midline.  No visible thyroid enlargement. Eyes: Conjunctivae and lids appear unremarkable.  Pupils equal and round. Skin: Warm and dry, no rashes noted.  Cardiovascular: Pulses palpable, no extremity edema. Neck: Negative spurling's Full neck range of motion Grip strength and sensation normal in bilateral hands Strength good C4 to T1 distribution No sensory change to C4 to T1 Reflexes normal Back Exam:  Inspection: Unremarkable  Motion: Flexion 45 deg, Extension 45 deg, Side Bending to 45 deg bilaterally,  Rotation to 45 deg bilaterally  SLR laying: Negative  XSLR laying: Negative  Palpable tenderness: None. FABER: negative. Sensory change: Gross sensation intact to all lumbar and sacral dermatomes.  Reflexes: 2+ at both patellar tendons, 2+ at achilles tendons, Babinski's downgoing.  Strength at foot  Plantar-flexion: 5/5 Dorsi-flexion: 5/5 Eversion: 5/5 Inversion: 5/5  Leg strength  Quad: 5/5 Hamstring: 5/5 Hip flexor: 5/5 Hip abductors: 5/5  Gait unremarkable.  Impression and Recommendations:   This case required medical decision making of moderate complexity.  Low back pain Axial low back pain without radiculitis. X-rays, MRI considering symptoms have been present for a long time, restarting formal physical therapy. Adding Celebrex, discontinue Flexeril because she cannot work with this. Also adding Cymbalta, down taper off of sertraline, she is tearful in the exam room has widespread neck and shoulder girdle pain consistent with myofascial pain syndrome of some type.  C7 radiculopathy Right-sided axial neck pain, also has some paresthesias in the hand that likely represent carpal tunnel syndrome. X-rays, MRI considering symptoms have been present for a long time, restarting formal physical therapy. Adding Celebrex, discontinue Flexeril because she cannot work with this. Also adding Cymbalta, down taper off  of  sertraline, she is tearful in the exam room has widespread neck and shoulder girdle pain consistent with myofascial pain syndrome of some type.  Carpal tunnel syndrome on both sides I think the paresthesias into her hands are more related to carpal tunnel syndrome at night, first 4 fingers. She should get Velcro braces and wear them nightly.  ___________________________________________ Ihor Austinhomas J. Benjamin Stainhekkekandam, M.D., ABFM., CAQSM. Primary Care and Sports Medicine [facility]  Adjunct Instructor of Family Medicine  [employer]

## 2017-11-29 NOTE — Assessment & Plan Note (Signed)
Axial low back pain without radiculitis. X-rays, MRI considering symptoms have been present for a long time, restarting formal physical therapy. Adding Celebrex, discontinue Flexeril because she cannot work with this. Also adding Cymbalta, down taper off of sertraline, she is tearful in the exam room has widespread neck and shoulder girdle pain consistent with myofascial pain syndrome of some type.

## 2017-11-29 NOTE — Assessment & Plan Note (Signed)
Right-sided axial neck pain, also has some paresthesias in the hand that likely represent carpal tunnel syndrome. X-rays, MRI considering symptoms have been present for a long time, restarting formal physical therapy. Adding Celebrex, discontinue Flexeril because she cannot work with this. Also adding Cymbalta, down taper off of sertraline, she is tearful in the exam room has widespread neck and shoulder girdle pain consistent with myofascial pain syndrome of some type.

## 2017-12-01 ENCOUNTER — Ambulatory Visit (INDEPENDENT_AMBULATORY_CARE_PROVIDER_SITE_OTHER): Payer: Federal, State, Local not specified - PPO | Admitting: Physical Therapy

## 2017-12-01 ENCOUNTER — Encounter: Payer: Self-pay | Admitting: Physical Therapy

## 2017-12-01 DIAGNOSIS — M6281 Muscle weakness (generalized): Secondary | ICD-10-CM | POA: Diagnosis not present

## 2017-12-01 DIAGNOSIS — M542 Cervicalgia: Secondary | ICD-10-CM | POA: Diagnosis not present

## 2017-12-01 DIAGNOSIS — M62830 Muscle spasm of back: Secondary | ICD-10-CM

## 2017-12-01 DIAGNOSIS — G8929 Other chronic pain: Secondary | ICD-10-CM | POA: Diagnosis not present

## 2017-12-01 DIAGNOSIS — M545 Low back pain: Secondary | ICD-10-CM | POA: Diagnosis not present

## 2017-12-01 NOTE — Therapy (Addendum)
[facility] [place], [place], [address] Phone: [phone]   Fax:  [phone]  Physical Therapy Evaluation  Patient Details  Name: Angela Oliver MRN: [id number] Date of Birth: [birth year] Referring Provider: Dr Dianah Field   Encounter Date: 12/01/2017  PT End of Session - 12/01/17 1621    Visit Number  1    Number of Visits  12    Date for PT Re-Evaluation  01/12/18    PT Start Time  7893    PT Stop Time  1731    PT Time Calculation (min)  70 min    Activity Tolerance  Patient limited by pain       Past Medical History:  Diagnosis Date  . Anxiety   . Arthritis   . Asthma   . Complication of anesthesia    slow to awaken  . Family history of adverse reaction to anesthesia    mother slow to awaken  . GERD (gastroesophageal reflux disease)   . Herpes simplex   . Irregular heart beat    in the past- no palpations- "skipped a beat"  . Migraines   . Seasonal allergies     Past Surgical History:  Procedure Laterality Date  . APPENDECTOMY    . CHOLECYSTECTOMY  06/01/2016   laproscopic   . CHOLECYSTECTOMY N/A 06/01/2016   Procedure: LAPAROSCOPIC CHOLECYSTECTOMY WITH INTRAOPERATIVE CHOLANGIOGRAM, REMOVAL PERITONEAL NODULE ADJACENT TO SIGMOID COLON;  Surgeon: Jackolyn Confer, MD;  Location: [facility];  Service: General;  Laterality: N/A;  . COLONOSCOPY     age 58  . OVARIAN CYST REMOVAL Left    part of ovary- cyst hadf ruptured    There were no vitals filed for this visit.   Subjective Assessment - 12/01/17 1621    Subjective  Angela Oliver reports [place] and had increase in neck and low back pain about 1-2 wks ago. She had stopped taking mobic and refilled the bottle and it didn't help.  She was taken off that and started on celebrex, she will start in a couple of days.  Currently having trouble turning her head to the Rt     How long can you sit comfortably?   sitting it fine however the longer she sits the harder it is to transition to stand.     How long can you stand comfortably?  miserable by 30 minutes    How long can you walk comfortably?  better than standing still    Patient Stated Goals  turn easier to the Rt with driving, stand without pain    Currently in Pain?  Yes    Pain Score  7     Pain Location  Neck    Pain Orientation  Right    Pain Descriptors / Indicators  Stabbing;Sharp    Pain Type  Chronic pain    Pain Onset  1 to 4 weeks ago    Pain Frequency  Constant    Aggravating Factors   turning head to the right, some numbness in hands with sleeping - sleeps in different position    Pain Relieving Factors  massage it out, heat.    Multiple Pain Sites  Yes    Pain Score  6    Pain Location  Back    Pain Orientation  Right    Pain Descriptors / Indicators  Constant;Aching;Stabbing    Pain Type  Chronic pain    Pain Onset  1 to 4 weeks ago    Pain Frequency  Constant    Aggravating Factors   standing still    Pain Relieving Factors  nothing yet.          [facility] PT Assessment - 12/01/17 0001      Assessment   Medical Diagnosis  Cervical and lumbar stenosis    Referring Provider  Dr Dianah Field    Onset Date/Surgical Date  11/17/17    Next MD Visit  after PT      Precautions   Precautions  None      Balance Screen   Has the patient fallen in the past 6 months  No      [place] residence      Prior Function   Level of Independence  Independent    Vocation  Full time employment    Vocation Requirements  airport security       Observation/Other Assessments   Focus on Therapeutic Outcomes (FOTO)   64% limited      ROM / Strength   AROM / PROM / Strength  AROM;Strength      AROM   AROM Assessment Site  Cervical;Lumbar;Shoulder    Right/Left Shoulder  -- bilat WNL    Cervical Flexion  58    Cervical Extension  52    Cervical - Right Side Bend  25  pain    Cervical -  Left Side Bend  43    Cervical - Right Rotation  58 pain Rt side    Cervical - Left Rotation  66    Lumbar Flexion  to floor, slow movement , feels in her Rt low back    Lumbar Extension  10% present, significant Rt LBP    Lumbar - Right Rotation  75% present with pain Rt low back    Lumbar - Left Rotation  90%       Strength   Strength Assessment Site  Shoulder;Elbow;Hip;Knee;Lumbar    Right/Left Shoulder  -- bilat WNL    Right/Left Elbow  -- bilat WNL    Right/Left Hip  -- bilat WNL    Right/Left Knee  -- bilat WNL    Lumbar Flexion  -- TA poor    Lumbar Extension  -- multifidi poor      Palpation   Spinal mobility  hypomobile and lots of guarding with PA mobs the whole spine.     Palpation comment  very tight and tender in Rt QL, lumbar paraspinals, gluts and piriformis, some tenderness and tightness in the Rt side. Trigger point in Rt upper trap and levator and perioccipital muscles with tenderness.              Objective measurements completed on examination: See above findings.      [facility] PT Treatment/Exercise - 12/01/17 0001      Exercises   Exercises  Lumbar;Neck      Neck Exercises: Supine   Neck Retraction  10 reps;5 secs with slight chin tuck      Lumbar Exercises: Stretches   Double Knee to Chest Stretch Limitations  seated FWD lean back stretch 2x30sec    Lower Trunk Rotation  3 reps;30 seconds      Modalities   Modalities  Electrical Stimulation;Moist Heat      Moist Heat Therapy   Number Minutes Moist Heat  20 Minutes    Moist Heat Location  Cervical;Lumbar Spine thoracic  Acupuncturist Location  Rt upper trap and Rt lumbar/buttocks    Electrical Stimulation Action  burst to Rt upper trap, modulated to Rt low back    Electrical Stimulation Parameters  to tolerance    Electrical Stimulation Goals  Pain;Tone      Neck Exercises: Stretches   Upper Trapezius Stretch  1 rep;30 seconds    Levator Stretch   1 rep;30 seconds             PT Education - 12/01/17 1721    Education provided  Yes    Education Details  HEP, DN and TENS    Person(s) Educated  Patient    Methods  Handout;Demonstration;Explanation    Comprehension  Verbalized understanding;Returned demonstration          PT Long Term Goals - 12/01/17 1727      PT LONG TERM GOAL #1   Title  I with advanced HEP (01/12/18)     Time  6    Period  Weeks    Status  New      PT LONG TERM GOAL #2   Title  improve FOTO =/< 45% limited (01/12/18)     Time  6    Period  Weeks    Status  New      PT LONG TERM GOAL #3   Title  report =/> 75% reduction of pain in her back with functional activities and work ( 01/12/18)     Time  6    Period  Weeks    Status  New      PT LONG TERM GOAL #4   Title  improve bilateral cervical rotation =/> 70 degrees to help with looking for traffic ( 01/12/18)     Time  6    Period  Weeks    Status  New      PT LONG TERM GOAL #5   Title  demo lumbar extension WNL without pain ( 01/12/18)     Time  6    Period  Weeks    Status  New      Additional Long Term Goals   Additional Long Term Goals  Yes      PT LONG TERM GOAL #6   Title  report =/> 75% improvement with transfering sit to /from stand ( 01/12/18)     Time  6    Period  Weeks    Status  New             Plan - 12/01/17 1724    Clinical Impression Statement  51 yo female well known to this therapist from previous treatment.  She presents with a flare up of neck and back pain.  It is limiting her ability to perform IADLs, work and leisure activities.  She has limited cervical and lumbar motion, core weakness and multiple areas of tightness in the back and shoulder muscularture, Rt side > Lt.      Clinical Presentation  Stable    Clinical Decision Making  Low    Rehab Potential  Good    PT Frequency  2x / week    PT Duration  6 weeks    PT Treatment/Interventions  Moist Heat;Ultrasound;Therapeutic exercise;Dry  needling;Taping;Manual techniques;Neuromuscular re-education;Cryotherapy;Electrical Stimulation;Patient/family education;Iontophoresis 35m/ml Dexamethasone;Traction    PT Next Visit Plan  DN and manual work to rt low back/gluts and Rt upper trap/levator, gentle exercise to increase spinal mobility.     Consulted and Agree with Plan  of Care  Patient       Patient will benefit from skilled therapeutic intervention in order to improve the following deficits and impairments:  Pain, Increased muscle spasms, Decreased mobility, Decreased range of motion, Hypomobility, Impaired UE functional use, Difficulty walking  Visit Diagnosis: Cervicalgia - Plan: PT plan of care cert/re-cert  Chronic bilateral low back pain without sciatica - Plan: PT plan of care cert/re-cert  Muscle weakness (generalized) - Plan: PT plan of care cert/re-cert  Muscle spasm of back - Plan: PT plan of care cert/re-cert     Problem List Patient Active Problem List   Diagnosis Date Noted  . Carpal tunnel syndrome on both sides 11/29/2017  . Pain of right breast 07/07/2017  . Family history of breast cancer in mother 07/07/2017  . Abnormal weight gain 07/07/2017  . Obesity (BMI 30.0-34.9) 01/27/2017  . C7 radiculopathy 12/01/2015  . 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 Low back pain 02/19/2014  . Trochanteric bursitis of right hip 11/22/2013  . 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/2012  . 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 Herpes simplex 10/23/2012  . Allergic rhinitis 06/09/2011  . Asthma 06/09/2011  . GERD 06/09/2011    Jeral Pinch PT  12/01/2017, 5:34 PM  [facility] [place] [address], [place], [address] Phone: [phone]   Fax:  [phone]  Name: Angela Oliver MRN: [id number] Date of Birth: [birth year]   PHYSICAL THERAPY DISCHARGE SUMMARY  Visits from Start of Care: 1  Current functional  level related to goals / functional outcomes: unknown   Remaining deficits: unknown   Education / Equipment: Initial HEP Plan:                                                    Patient goals were not met. Patient is being discharged due to not returning since the last visit.  ?????     Jeral Pinch, PT 01/17/18 9:18 AM

## 2017-12-01 NOTE — Patient Instructions (Addendum)
TENS: recommendations   for rt upper shoulder/neck : use one channel, one electrode by spine Rt side, other at top of shoulder blade right side.  Use Burst mode, increase intensity to get a strong muscle twitch. Can use heat with this.   30 min on, 15 min off for 2-3 hrs  Low back , use all four electrodes, modulation mode and an X set up.    TENS stands for Transcutaneous Electrical Nerve Stimulation. In other words, electrical impulses are allowed to pass through the skin in order to excite a nerve.   Purpose and Use of TENS:  TENS is a method used to manage acute and chronic pain without the use of drugs. It has been effective in managing pain associated with surgery, sprains, strains, trauma, rheumatoid arthritis, and neuralgias. It is a non-addictive, low risk, and non-invasive technique used to control pain. It is not, by any means, a curative form of treatment.   How TENS Works:  Most TENS units are a [employer] pocket-sized unit powered by one 9 volt battery. Attached to the outside of the unit are two lead wires where two pins and/or snaps connect on each wire. All units come with a set of four reusable pads or electrodes. These are placed on the skin surrounding the area involved. By inserting the leads into  the pads, the electricity can pass from the unit making the circuit complete.  As the intensity is turned up slowly, the electrical current enters the body from the electrodes through the skin to the surrounding nerve fibers. This triggers the release of hormones from within the body. These hormones contain pain relievers. By increasing the circulation of these hormones, the person's pain may be lessened. It is also believed that the electrical stimulation itself helps to block the pain messages being sent to the brain, thus also decreasing the body's perception of pain.   Hazards:  TENS units are NOT to be used by patients with PACEMAKERS, DEFIBRILLATORS, DIABETIC PUMPS, PREGNANT WOMEN, and  patients with SEIZURE DISORDERS.  TENS units are NOT to be used over the heart, throat, brain, or spinal cord.  One of the major side effects from the TENS unit may be skin irritation. Some people may develop a rash if they are sensitive to the materials used in the electrodes or the connecting wires.   Avoid overuse due the body getting used to the stem making it not as effective over time.    Trigger Point Dry Needling  . What is Trigger Point Dry Needling (DN)? o DN is a physical therapy technique used to treat muscle pain and dysfunction. Specifically, DN helps deactivate muscle trigger points (muscle knots).  o A thin filiform needle is used to penetrate the skin and stimulate the underlying trigger point. The goal is for a local twitch response (LTR) to occur and for the trigger point to relax. No medication of any kind is injected during the procedure.   . What Does Trigger Point Dry Needling Feel Like?  o The procedure feels different for each individual patient. Some patients report that they do not actually feel the needle enter the skin and overall the process is not painful. Very mild bleeding may occur. However, many patients feel a deep cramping in the muscle in which the needle was inserted. This is the local twitch response.   Marland Kitchen. How Will I feel after the treatment? o Soreness is normal, and the onset of soreness may not occur for a few  hours. Typically this soreness does not last longer than two days.  o Bruising is uncommon, however; ice can be used to decrease any possible bruising.  o In rare cases feeling tired or nauseous after the treatment is normal. In addition, your symptoms may get worse before they get better, this period will typically not last longer than 24 hours.   . What Can I do After My Treatment? o Increase your hydration by drinking more water for the next 24 hours. o You may place ice or heat on the areas treated that have become sore, however, do not use  heat on inflamed or bruised areas. Heat often brings more relief post needling. o You can continue your regular activities, but vigorous activity is not recommended initially after the treatment for 24 hours. o DN is best combined with other physical therapy such as strengthening, stretching, and other therapies.    Lower Back Stretch (Sitting)    Sit in chair with knees spread apart. Bend forward to floor. A comfortable stretch should be felt in lower back. Hold _20-30___ seconds. Repeat _2___ times per set. Do __1__ sets per session. Do __2-3__ sessions per day.   Lumbar Rotation (Non-Weight Bearing)    Feet on floor, slowly rock knees from side to side in small, pain-free range of motion. Allow lower back to rotate slightly. Repeat __2__ times per set. Do __1__ sets per session. Do __2-3__ sessions per day.  Head Press With Chin Tuck    Tuck chin SLIGHTLY toward chest, keep mouth closed. Feel weight on back of head. Increase weight by pressing head down. Hold __5_ seconds. Relax. Repeat _10__ times. Repeat 2 -3times a day.    Flexibility: Upper Trapezius Stretch - second stretch - turn chin towards left armpit and then gentle over pressure to move stretch to the back of the neck.     Gently grasp right side of head while reaching behind back with other hand. Tilt head away until a gentle stretch is felt. Hold _20-30___ seconds. Repeat __2__ times per set. Do __1__ sets per session. Do __2-3__ sessions per day.

## 2017-12-08 ENCOUNTER — Encounter: Payer: Federal, State, Local not specified - PPO | Admitting: Physical Therapy

## 2017-12-26 ENCOUNTER — Other Ambulatory Visit: Payer: Self-pay | Admitting: Physician Assistant

## 2017-12-27 ENCOUNTER — Ambulatory Visit: Payer: Federal, State, Local not specified - PPO | Admitting: Sports Medicine

## 2018-01-10 ENCOUNTER — Telehealth: Payer: Self-pay | Admitting: *Deleted

## 2018-01-10 NOTE — Telephone Encounter (Signed)
Pt's forms corrected, copied,scanned, faxed, confirmation received .Angela Oliver, Angela Oliver

## 2018-01-23 ENCOUNTER — Ambulatory Visit (INDEPENDENT_AMBULATORY_CARE_PROVIDER_SITE_OTHER): Payer: Federal, State, Local not specified - PPO | Admitting: Physician Assistant

## 2018-01-23 ENCOUNTER — Encounter: Payer: Self-pay | Admitting: Physician Assistant

## 2018-01-23 VITALS — BP 134/82 | HR 61 | Temp 98.2°F | Resp 14 | Wt 200.0 lb

## 2018-01-23 DIAGNOSIS — G43709 Chronic migraine without aura, not intractable, without status migrainosus: Secondary | ICD-10-CM

## 2018-01-23 DIAGNOSIS — J019 Acute sinusitis, unspecified: Secondary | ICD-10-CM

## 2018-01-23 DIAGNOSIS — J4530 Mild persistent asthma, uncomplicated: Secondary | ICD-10-CM

## 2018-01-23 LAB — POCT INFLUENZA A/B
INFLUENZA B, POC: NEGATIVE
Influenza A, POC: NEGATIVE

## 2018-01-23 MED ORDER — ALBUTEROL SULFATE 108 (90 BASE) MCG/ACT IN AEPB: 2.0000 | INHALATION_SPRAY | RESPIRATORY_TRACT | 1 refills | Status: DC | PRN

## 2018-01-23 MED ORDER — PREDNISONE 50 MG PO TABS
50.0000 mg | ORAL_TABLET | Freq: Every day | ORAL | 0 refills | Status: DC
Start: 2018-01-23 — End: 2018-02-01

## 2018-01-23 MED ORDER — IPRATROPIUM BROMIDE 0.06 % NA SOLN: 2.0000 | Freq: Four times a day (QID) | NASAL | 0 refills | Status: DC | PRN

## 2018-01-23 MED ORDER — DEXAMETHASONE SODIUM PHOSPHATE 4 MG/ML IJ SOLN
4.0000 mg | Freq: Once | INTRAMUSCULAR | Status: AC
  Administered 2018-01-23: 4 mg via INTRAMUSCULAR

## 2018-01-23 MED ORDER — CEFUROXIME AXETIL 250 MG PO TABS: 250.0000 mg | ORAL_TABLET | Freq: Two times a day (BID) | ORAL | 0 refills | Status: DC

## 2018-01-23 MED ORDER — KETOROLAC TROMETHAMINE 30 MG/ML IJ SOLN
30.0000 mg | Freq: Once | INTRAMUSCULAR | Status: AC
  Administered 2018-01-23: 30 mg via INTRAMUSCULAR

## 2018-01-23 NOTE — Patient Instructions (Signed)
-   antibiotic twice a day for 10 days for sinus infection - steroid with breakfast for 5 days for migraine and to prevent asthma exacerbation. Start Prednisone tomorrow. You already received steroid in the office today - use Albuterol inhaler 1-2 puffs every 4-6 hours as needed for shortness of breath and wheezing - Atrovent nasal spray for nasal congestion - nasal saline rinses/netty pot - warm facial compresses    Sinus Headache A sinus headache occurs when the paranasal sinuses become clogged or swollen. Paranasal sinuses are air pockets within the bones of the face. Sinus headaches can range from mild to severe. What are the causes? A sinus headache can result from various conditions that affect the sinuses, such as:  Colds.  Sinus infections.  Allergies.  What are the signs or symptoms? The main symptom of this condition is a headache that may feel like pain or pressure in the face, forehead, ears, or upper teeth. People who have a sinus headache often have other symptoms, such as:  Congested or runny nose.  Fever.  Inability to smell.  Weather changes can make symptoms worse. How is this diagnosed? This condition may be diagnosed based on:  A physical exam and medical history.  Imaging tests, such as a CT scan and MRI, to check for problems with the sinuses.  A specialist may look into the sinuses with a tool that has a camera (endoscopy).  How is this treated? Treatment for this condition depends on the cause.  Sinus pain that is caused by a sinus infection may be treated with antibiotic medicine.  Sinus pain that is caused by allergies may be helped by allergy medicines (antihistamines) and medicated nasal sprays.  Sinus pain that is caused by congestion may be helped by flushing the nose and sinuses with saline solution.  Follow these instructions at home:  Take medicines only as directed by your health care provider.  If you were prescribed an antibiotic  medicine, finish all of it even if you start to feel better.  If you have congestion, use a nasal spray to help reduce pressure.  If directed, apply a warm, moist washcloth to your face to help relieve pain. Contact a health care provider if:  You have headaches more than one time each week.  You have sensitivity to light or sound.  You have a fever.  You feel sick to your stomach (nauseous) or you throw up (vomit).  Your headaches do not get better with treatment. Many people think that they have a sinus headache when they actually have migraines or tension headaches. Get help right away if:  You have vision problems.  You have sudden, severe pain in your face or head.  You have a seizure.  You are confused.  You have a stiff neck. This information is not intended to replace advice given to you by your health care provider. Make sure you discuss any questions you have with your health care provider. Document Released: 01/06/2005 Document Revised: 07/25/2016 Document Reviewed: 11/25/2014 Elsevier Interactive Patient Education  [employer]2018 [employer]

## 2018-01-23 NOTE — Progress Notes (Signed)
HPI:                                                                Angela Oliver is a 52 y.o. female who presents to [facility] Angela Oliver: Primary Care Sports Medicine today for sinus congestion and headache  Migraine   This is a recurrent problem. The current episode started today. The problem occurs constantly. The problem has been unchanged. The pain is located in the frontal region. The pain quality is similar to prior headaches. The quality of the pain is described as boring. Associated symptoms include coughing (non-productive) and sinus pressure. Pertinent negatives include no ear pain or sore throat. She has tried Excedrin, darkened room and beta blockers (Excedrin) for the symptoms. Her past medical history is significant for migraine headaches.  Sinusitis  This is a new problem. The current episode started 1 to 4 weeks ago. The problem has been gradually worsening since onset. The pain is mild. Associated symptoms include chills, congestion, coughing (non-productive), a hoarse voice and sinus pressure. Pertinent negatives include no ear pain or sore throat. Treatments tried: Emergen-C.     No flowsheet data found.  No flowsheet data found.    Past Medical History:  Diagnosis Date  . Anxiety   . Arthritis   . Asthma   . Complication of anesthesia    slow to awaken  . Family history of adverse reaction to anesthesia    mother slow to awaken  . GERD (gastroesophageal reflux disease)   . Herpes simplex   . Irregular heart beat    in the past- no palpations- "skipped a beat"  . Migraines   . Seasonal allergies    Past Surgical History:  Procedure Laterality Date  . APPENDECTOMY    . CHOLECYSTECTOMY  06/01/2016   laproscopic   . CHOLECYSTECTOMY N/A 06/01/2016   Procedure: LAPAROSCOPIC CHOLECYSTECTOMY WITH INTRAOPERATIVE CHOLANGIOGRAM, REMOVAL PERITONEAL NODULE ADJACENT TO SIGMOID COLON;  Surgeon: Angela Peace, MD;  Location: [facility] OR;  Service: General;   Laterality: N/A;  . COLONOSCOPY     age 79  . OVARIAN CYST REMOVAL Left    part of ovary- cyst hadf ruptured   Social History   Tobacco Use  . Smoking status: Never Smoker  . Smokeless tobacco: Never Used  Substance Use Topics  . Alcohol use: No   family history includes Alcohol abuse in her father; Asthma in her other; Cancer in her mother; Diabetes in her father; Hyperlipidemia in her father.    ROS: negative except as noted in the HPI  Medications: Current Outpatient Medications  Medication Sig Dispense Refill  . acyclovir (ZOVIRAX) 400 MG tablet TAKE 1 TABLET BY MOUTH TWICE A DAY 60 tablet 2  . Albuterol Sulfate (PROAIR RESPICLICK) 108 (90 Base) MCG/ACT AEPB Inhale 2 puffs into the lungs every 4 (four) hours as needed (shortness of breath/ wheezing). 1 each 1  . cefUROXime (CEFTIN) 250 MG tablet Take 1 tablet (250 mg total) by mouth 2 (two) times daily with a meal for 10 days. 20 tablet 0  . celecoxib (CELEBREX) 200 MG capsule One to 2 tablets by mouth daily as needed for pain. 60 capsule 2  . cetirizine (ZYRTEC) 10 MG tablet Take 10 mg by mouth daily.    Marland Kitchen  CONTRAVE 8-90 MG TB12 TAKE 1 TAB DAILY FOR 7 DAYS, THEN 1 TWICE A DAY FOR 7 DAYS, THEN 2 IN THE AM AND 1 IN THE PM FOR 7 DAYS THEN TAKE 2 TWICE DAILY THEREAFTER. 120 tablet 3  . diclofenac sodium (VOLTAREN) 1 % GEL Apply 4 g topically 4 (four) times daily. To affected joint. 100 g 11  . DULoxetine (CYMBALTA) 30 MG capsule Take 1 capsule (30 mg total) by mouth daily. 30 capsule 3  . ipratropium (ATROVENT) 0.06 % nasal spray Place 2 sprays into both nostrils 4 (four) times daily as needed. 15 mL 0  . montelukast (SINGULAIR) 10 MG tablet TAKE 1 TABLET (10 MG TOTAL) BY MOUTH DAILY. 90 tablet 3  . omeprazole (PRILOSEC) 40 MG capsule Take 1 capsule (40 mg total) by mouth daily. 90 capsule 1  . predniSONE (DELTASONE) 50 MG tablet Take 1 tablet (50 mg total) by mouth daily. 5 tablet 0  . propranolol (INDERAL) 40 MG tablet Take 1  tablet (40 mg total) by mouth 2 (two) times daily. Due for follow up 180 tablet 1  . QVAR 80 MCG/ACT inhaler INHALE 2 PUFFS INTO THE LUNGS 2 (TWO) TIMES DAILY. 8.7 g 4  . TROKENDI XR 25 MG CP24 TAKE ONE CAPSULE EVERY DAY 30 capsule 0   No current facility-administered medications for this visit.    Allergies  Allergen Reactions  . Sulfa Antibiotics Hives, Shortness Of Breath, Swelling and Rash    Throat swelling  . Topamax [Topiramate] Other (See Comments)    Severe stomach pain       Objective:  BP 134/82   Pulse 61   Temp 98.2 F (36.8 C)   Wt 200 lb (90.7 kg)   LMP 11/12/2016 (Approximate)   SpO2 100%   BMI 34.33 kg/m  Gen:  alert, not ill-appearing, no distress, appropriate for age HEENT: head normocephalic without obvious abnormality, conjunctiva and cornea clear, PERRL, there is bilateral frontal sinus tenderness, nasal mucosa edematous, oropharynx clear, neck supple, no adenopathy, trachea midline Pulm: Normal work of breathing, normal phonation, clear to auscultation bilaterally, no wheezes, rales or rhonchi CV: Normal rate, regular rhythm, s1 and s2 distinct, no murmurs, clicks or rubs  Neuro: alert and oriented x 3, no tremor MSK: extremities atraumatic, normal gait and station Skin: intact, no rashes on exposed skin, no jaundice, no cyanosis     Results for orders placed or performed in visit on 01/23/18 (from the past 72 hour(s))  POCT Influenza A/B     Status: Normal   Collection Time: 01/23/18  4:43 PM  Result Value Ref Range   Influenza A, POC Negative Negative   Influenza B, POC Negative Negative   No results found.    Assessment and Plan: 52 y.o. female with   1. Acute non-recurrent sinusitis, unspecified location - POCT Influenza A/B negative - approx. 10 days of symptoms, worsening, covering for bacterial sinusitis - cefUROXime (CEFTIN) 250 MG tablet; Take 1 tablet (250 mg total) by mouth 2 (two) times daily with a meal for 10 days.  Dispense:  20 tablet; Refill: 0 - ipratropium (ATROVENT) 0.06 % nasal spray; Place 2 sprays into both nostrils 4 (four) times daily as needed.  Dispense: 15 mL; Refill: 0  2. Chronic migraine without aura without status migrainosus, not intractable - usual migraine with photophobia and nausea.  - dexamethasone (DECADRON) injection 4 mg - ketorolac (TORADOL) 30 MG/ML injection 30 mg  3. Mild persistent asthma without complication - SpO2 100%  on RA at rest. Lungs CTA. NO evidence of asthma exacerbation. Refill of Albuterol provided.  - Albuterol Sulfate (PROAIR RESPICLICK) 108 (90 Base) MCG/ACT AEPB; Inhale 2 puffs into the lungs every 4 (four) hours as needed (shortness of breath/ wheezing).  Dispense: 1 each; Refill: 1 - predniSONE (DELTASONE) 50 MG tablet; Take 1 tablet (50 mg total) by mouth daily.  Dispense: 5 tablet; Refill: 0     Patient education and anticipatory guidance given Patient agrees with treatment plan Follow-up with PCP in 1-2 weeks for migraines and asthma or sooner as needed if symptoms worsen or fail to improve  Levonne Hubert PA-C

## 2018-02-01 ENCOUNTER — Ambulatory Visit (INDEPENDENT_AMBULATORY_CARE_PROVIDER_SITE_OTHER): Payer: Federal, State, Local not specified - PPO | Admitting: Physician Assistant

## 2018-02-01 ENCOUNTER — Encounter: Payer: Self-pay | Admitting: Physician Assistant

## 2018-02-01 VITALS — BP 124/69 | HR 66 | Ht 64.0 in | Wt 199.0 lb

## 2018-02-01 DIAGNOSIS — R635 Abnormal weight gain: Secondary | ICD-10-CM | POA: Diagnosis not present

## 2018-02-01 DIAGNOSIS — R197 Diarrhea, unspecified: Secondary | ICD-10-CM

## 2018-02-01 DIAGNOSIS — J4530 Mild persistent asthma, uncomplicated: Secondary | ICD-10-CM | POA: Diagnosis not present

## 2018-02-01 DIAGNOSIS — J3089 Other allergic rhinitis: Secondary | ICD-10-CM | POA: Diagnosis not present

## 2018-02-01 DIAGNOSIS — G43711 Chronic migraine without aura, intractable, with status migrainosus: Secondary | ICD-10-CM

## 2018-02-01 DIAGNOSIS — E669 Obesity, unspecified: Secondary | ICD-10-CM

## 2018-02-01 DIAGNOSIS — H6982 Other specified disorders of Eustachian tube, left ear: Secondary | ICD-10-CM | POA: Diagnosis not present

## 2018-02-01 MED ORDER — LIRAGLUTIDE -WEIGHT MANAGEMENT 18 MG/3ML ~~LOC~~ SOPN: 0.6000 mg | PEN_INJECTOR | Freq: Every day | SUBCUTANEOUS | 1 refills | Status: DC

## 2018-02-01 MED ORDER — MONTELUKAST SODIUM 10 MG PO TABS: ORAL_TABLET | ORAL | 3 refills | Status: DC

## 2018-02-01 NOTE — Patient Instructions (Addendum)
emgality/aimovig  Eustachian Tube Dysfunction The eustachian tube connects the middle ear to the back of the nose. It regulates air pressure in the middle ear by allowing air to move between the ear and nose. It also helps to drain fluid from the middle ear space. When the eustachian tube does not function properly, air pressure, fluid, or both can build up in the middle ear. Eustachian tube dysfunction can affect one or both ears. What are the causes? This condition happens when the eustachian tube becomes blocked or cannot open normally. This may result from:  Ear infections.  Colds and other upper respiratory infections.  Allergies.  Irritation, such as from cigarette smoke or acid from the stomach coming up into the esophagus (gastroesophageal reflux).  Sudden changes in air pressure, such as from descending in an airplane.  Abnormal growths in the nose or throat, such as nasal polyps, tumors, or enlarged tissue at the back of the throat (adenoids).  What increases the risk? This condition may be more likely to develop in people who smoke and people who are overweight. Eustachian tube dysfunction may also be more likely to develop in children, especially children who have:  Certain birth defects of the mouth, such as cleft palate.  Large tonsils and adenoids.  What are the signs or symptoms? Symptoms of this condition may include:  A feeling of fullness in the ear.  Ear pain.  Clicking or popping noises in the ear.  Ringing in the ear.  Hearing loss.  Loss of balance.  Symptoms may get worse when the air pressure around you changes, such as when you travel to an area of high elevation or fly on an airplane. How is this diagnosed? This condition may be diagnosed based on:  Your symptoms.  A physical exam of your ear, nose, and throat.  Tests, such as those that measure: ? The movement of your eardrum (tympanogram). ? Your hearing (audiometry).  How is this  treated? Treatment depends on the cause and severity of your condition. If your symptoms are mild, you may be able to relieve your symptoms by moving air into ("popping") your ears. If you have symptoms of fluid in your ears, treatment may include:  Decongestants.  Antihistamines.  Nasal sprays or ear drops that contain medicines that reduce swelling (steroids).  In some cases, you may need to have a procedure to drain the fluid in your eardrum (myringotomy). In this procedure, a small tube is placed in the eardrum to:  Drain the fluid.  Restore the air in the middle ear space.  Follow these instructions at home:  Take over-the-counter and prescription medicines only as told by your health care provider.  Use techniques to help pop your ears as recommended by your health care provider. These may include: ? Chewing gum. ? Yawning. ? Frequent, forceful swallowing. ? Closing your mouth, holding your nose closed, and gently blowing as if you are trying to blow air out of your nose.  Do not do any of the following until your health care provider approves: ? Travel to high altitudes. ? Fly in airplanes. ? Work in a Estate agent or room. ? Scuba dive.  Keep your ears dry. Dry your ears completely after showering or bathing.  Do not smoke.  Keep all follow-up visits as told by your health care provider. This is important. Contact a health care provider if:  Your symptoms do not go away after treatment.  Your symptoms come back after treatment.  You are unable to pop your ears.  You have: ? A fever. ? Pain in your ear. ? Pain in your head or neck. ? Fluid draining from your ear.  Your hearing suddenly changes.  You become very dizzy.  You lose your balance. This information is not intended to replace advice given to you by your health care provider. Make sure you discuss any questions you have with your health care provider. Document Released: 12/26/2015 Document  Revised: 05/06/2016 Document Reviewed: 12/18/2014 Elsevier Interactive Patient Education  [employer]2018 Elsevier Inc.

## 2018-02-01 NOTE — Progress Notes (Deleted)
   Subjective:    Patient ID: Hubert AzureBrenda Coven, female    DOB: 11/30/1966, 52 y.o.   MRN: [id number]  HPI Dex/tor 7-10  34  Weight   contrave twice  May last year.   Black 3-5         Review of Systems     Objective:   Physical Exam        Assessment & Plan:

## 2018-02-01 NOTE — Progress Notes (Signed)
Subjective:     Patient ID: Angela Oliver, female   DOB: October 11, 1966, 52 y.o.   MRN: [id number]  HPI   Angela Oliver is a 52 year old female here today for follow up visit for sinus infection and migraine sinus infection and migraine on 02/11. Patient stated that her symptoms resolving and feeling better since seen last time. Patient admits that she still feeling nasal drainage, mild wheezing in am but symptoms related to her asthma and allergies has been improved.   Patient normally have 7-10 episodes of migraine per month. She had one episode last Sunday and Monday with similar symptoms. Excedrin seems to be effective to resolve her migraine symptoms. Patient states that her left ear is sore and have buzzing and loud noices since the infection which comes and goes. Patient denies vertigo but felt light headed couple of times today.   Patient also experienced an episode of diarrhea on Sunday and Monday. She used bathroom 5-6 times with loose stool. She felt nauseated/dry heaving during that episode but denies vomiting. No vision changes, no congestion, no increased sputum production or hemoptysis. No fever chills night sweats. No hearing loss, no vision change.  She also comes in to discuss weight. She tried contrave for 2 months with little benefit. She feels very frustrated with this. She has tried to stay active and eat low calorie meals.   .. Active Ambulatory Problems    Diagnosis Date Noted  . Allergic rhinitis 06/09/2011  . Asthma 06/09/2011  . GERD 06/09/2011  . 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 Herpes simplex 10/23/2012  . 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/2012  . Trochanteric bursitis of right hip 11/22/2013  . Low back pain 02/19/2014  . 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 C7 radiculopathy 12/01/2015  . Obesity (BMI 30.0-34.9) 01/27/2017  . Pain of right breast 07/07/2017  . Family history of breast cancer in mother 07/07/2017  .  Abnormal weight gain 07/07/2017  . Carpal tunnel syndrome on both sides 11/29/2017  . Dysfunction of left eustachian tube 02/01/2018   Resolved Ambulatory Problems    Diagnosis Date Noted  . RUQ PAIN 06/09/2011  . Migraines 10/23/2012  . Trochanteric bursitis 11/03/2012  . Neck muscle spasm 11/03/2012  . Peroneal tendinitis of left lower extremity 12/29/2012  . Left foot pain 11/22/2013  . Foot pain, left 02/19/2014  . Poison ivy dermatitis 07/04/2014  . Acute bronchitis 09/16/2015  . Paresthesia of both hands 12/01/2015  . Calculus of gallbladder without cholecystitis without obstruction 05/03/2016  . Symptomatic cholelithiasis 06/01/2016  . S/P cholecystectomy 07/16/2016  . Low back sprain 07/28/2016   Past Medical History:  Diagnosis Date  . Anxiety   . Arthritis   . Asthma   . Complication of anesthesia   . Family history of adverse reaction to anesthesia   . GERD (gastroesophageal reflux disease)   . Herpes simplex   . Irregular heart beat   . Migraines   . Seasonal allergies      Review of Systems  As mentioned in HPI.      Objective:   Physical Exam  Constitutional: She is oriented to person, place, and time. She appears well-developed and well-nourished. No distress.  HENT:  Head: Normocephalic and atraumatic.  Right Ear: External ear normal.  Left Ear: External ear normal.  Mouth/Throat: Oropharynx is clear and moist. No oropharyngeal exudate.  Eyes: Pupils are equal, round, and reactive to light. Right eye exhibits no discharge. Left eye exhibits  no discharge. No scleral icterus.  Neck: Neck supple. No tracheal deviation present. No thyromegaly present.  Cardiovascular: Normal rate, regular rhythm, normal heart sounds and intact distal pulses. Exam reveals no gallop and no friction rub.  No murmur heard. Pulmonary/Chest: No respiratory distress. She has no wheezes. She has no rales. She exhibits no tenderness.  Neurological: She is alert and oriented to  person, place, and time.  Psychiatric: She has a normal mood and affect. Her behavior is normal.       Assessment:     Ms. Angela Oliver with history of migraine, recently treated for sinus infection, here for follow up, reported feeling better    Plan:      Marland Kitchen.Marland Kitchen.Diagnoses and all orders for this visit:  Dysfunction of left eustachian tube  Obesity (BMI 30.0-34.9) -     Liraglutide -Weight Management (SAXENDA) 18 MG/3ML SOPN; Inject 0.6 mg into the skin daily. For one week then increase by .6mg  weekly until reaches 3mg  daily.  Please include ultra fine needles 6mm  Abnormal weight gain  Mild persistent asthma without complication -     montelukast (SINGULAIR) 10 MG tablet; TAKE 1 TABLET (10 MG TOTAL) BY MOUTH DAILY.  Non-seasonal allergic rhinitis, unspecified trigger -     montelukast (SINGULAIR) 10 MG tablet; TAKE 1 TABLET (10 MG TOTAL) BY MOUTH DAILY.  Intractable chronic migraine without aura and with status migrainosus  Diarrhea, unspecified type   Discussed ETD and treatment. She declines any nasal spray use. She states "she just can't stand nasal sprays". I offered conservative treatment and HO. If no improvement with time and conservative treatment we could consider a steroid taper. singulair refilled today.   Marland Kitchen..Discussed low carb diet with 1500 calories and 80g of protein.  Exercising at least 150 minutes a week.  My Fitness Pal could be a [employer] resource.  Discussed options. She agreed to saxenda. Discussed side effects and titration up. Follow up in 6 weeks.  She has tried contrave with no benefit. topamax side effects.  Demonstrated use of saxenda pen today.   Discussed migraines: I think she would be a great candidate for emgality. Gave HO and coupon card. Will discuss at next visit. Pt cannot take any "mind altering drugs" with her job and that has excluded a lot of benefical treatments for her. Follow up in 6 weeks.  Demonstrated use of emgality pen today.   Marland Kitchen..Spent 40  minutes with patient and greater than 50 percent of visit spent counseling patient regarding treatment plan.

## 2018-02-10 ENCOUNTER — Other Ambulatory Visit: Payer: Self-pay | Admitting: Sports Medicine

## 2018-02-24 ENCOUNTER — Other Ambulatory Visit: Payer: Self-pay | Admitting: Sports Medicine

## 2018-02-24 DIAGNOSIS — G8929 Other chronic pain: Secondary | ICD-10-CM

## 2018-02-24 DIAGNOSIS — M545 Low back pain: Principal | ICD-10-CM

## 2018-03-05 ENCOUNTER — Other Ambulatory Visit: Payer: Self-pay | Admitting: Physician Assistant

## 2018-03-05 DIAGNOSIS — J019 Acute sinusitis, unspecified: Secondary | ICD-10-CM

## 2018-03-12 ENCOUNTER — Other Ambulatory Visit: Payer: Self-pay | Admitting: Physician Assistant

## 2018-03-12 DIAGNOSIS — J019 Acute sinusitis, unspecified: Secondary | ICD-10-CM

## 2018-03-15 ENCOUNTER — Encounter: Payer: Self-pay | Admitting: Physician Assistant

## 2018-03-15 ENCOUNTER — Telehealth: Payer: Self-pay | Admitting: Physician Assistant

## 2018-03-15 ENCOUNTER — Ambulatory Visit (INDEPENDENT_AMBULATORY_CARE_PROVIDER_SITE_OTHER): Payer: Federal, State, Local not specified - PPO | Admitting: Physician Assistant

## 2018-03-15 VITALS — BP 135/80 | HR 62 | Ht 64.0 in | Wt 202.0 lb

## 2018-03-15 DIAGNOSIS — Z13220 Encounter for screening for lipoid disorders: Secondary | ICD-10-CM

## 2018-03-15 DIAGNOSIS — J4530 Mild persistent asthma, uncomplicated: Secondary | ICD-10-CM

## 2018-03-15 DIAGNOSIS — E669 Obesity, unspecified: Secondary | ICD-10-CM

## 2018-03-15 DIAGNOSIS — G43711 Chronic migraine without aura, intractable, with status migrainosus: Secondary | ICD-10-CM

## 2018-03-15 DIAGNOSIS — Z131 Encounter for screening for diabetes mellitus: Secondary | ICD-10-CM | POA: Diagnosis not present

## 2018-03-15 DIAGNOSIS — Z0000 Encounter for general adult medical examination without abnormal findings: Secondary | ICD-10-CM

## 2018-03-15 MED ORDER — ONDANSETRON HCL 8 MG PO TABS: 8.0000 mg | ORAL_TABLET | Freq: Three times a day (TID) | ORAL | 2 refills | Status: DC | PRN

## 2018-03-15 MED ORDER — PROPRANOLOL HCL 40 MG PO TABS: 40.0000 mg | ORAL_TABLET | Freq: Two times a day (BID) | ORAL | 3 refills | Status: DC

## 2018-03-15 MED ORDER — KETOROLAC TROMETHAMINE 60 MG/2ML IM SOLN
60.0000 mg | Freq: Once | INTRAMUSCULAR | Status: AC
  Administered 2018-03-15: 60 mg via INTRAMUSCULAR

## 2018-03-15 NOTE — Telephone Encounter (Signed)
Can we call drug company for aimovig and see if can get a sample? And confirm best way to make sure approved so patient can continue with treatment.

## 2018-03-15 NOTE — Progress Notes (Signed)
Subjective:    Patient ID: Angela Oliver, female    DOB: [birth year], 52 y.o.   MRN: [id number]  HPI Pt is a 52 yo obese female who presents to the clinic to fill out FMLA paperwork for missed migraine days and followup.   Pt need FMLA paperwork filled out so that she can be allowed to miss a few days a month due to migraines. Long hx of migraines. Still not controlled. She has on average 1-2 a week but not all of them keep her out of work.she would estimate greater than 15 migraine days a month. She takes Excedrin migraine. Her company does not let her take anything else that could be mood altering. She is interested in trying amovig a new monoclonal antibody. Pt is on propranolol for prevention. She has tried topamax but had side effects. She cannot be on Elavil or trptians for rescue due to work protocol.   She also needs an rescue inhaler for as needed usage.   saxenda was not covered to start for weight loss. Pt would like for me to write letter to see if insurance would pay for it.   .. Active Ambulatory Problems    Diagnosis Date Noted  . Allergic rhinitis 06/09/2011  . Asthma 06/09/2011  . GERD 06/09/2011  . 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 Herpes simplex 10/23/2012  . 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/2012  . Trochanteric bursitis of right hip 11/22/2013  . Low back pain 02/19/2014  . 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 C7 radiculopathy 12/01/2015  . Obesity (BMI 30.0-34.9) 01/27/2017  . Pain of right breast 07/07/2017  . Family history of breast cancer in mother 07/07/2017  . Abnormal weight gain 07/07/2017  . Carpal tunnel syndrome on both sides 11/29/2017  . Dysfunction of left eustachian tube 02/01/2018   Resolved Ambulatory Problems    Diagnosis Date Noted  . RUQ PAIN 06/09/2011  . Migraines 10/23/2012  . Trochanteric bursitis 11/03/2012  . Neck muscle spasm 11/03/2012  . Peroneal tendinitis of left lower  extremity 12/29/2012  . Left foot pain 11/22/2013  . Foot pain, left 02/19/2014  . Poison ivy dermatitis 07/04/2014  . Acute bronchitis 09/16/2015  . Paresthesia of both hands 12/01/2015  . Calculus of gallbladder without cholecystitis without obstruction 05/03/2016  . Symptomatic cholelithiasis 06/01/2016  . S/P cholecystectomy 07/16/2016  . Low back sprain 07/28/2016   Past Medical History:  Diagnosis Date  . Anxiety   . Arthritis   . Asthma   . Complication of anesthesia   . Family history of adverse reaction to anesthesia   . GERD (gastroesophageal reflux disease)   . Herpes simplex   . Irregular heart beat   . Migraines   . Seasonal allergies       Review of Systems  All other systems reviewed and are negative.      Objective:   Physical Exam  Constitutional: She is oriented to person, place, and time. She appears well-developed and well-nourished.  HENT:  Head: Normocephalic and atraumatic.  Right Ear: External ear normal.  Left Ear: External ear normal.  Eyes: Pupils are equal, round, and reactive to light. Conjunctivae and EOM are normal.  Neck: Normal range of motion. Neck supple.  Cardiovascular: Normal rate, regular rhythm and normal heart sounds.  Pulmonary/Chest: Effort normal and breath sounds normal.  Neurological: She is alert and oriented to person, place, and time. She has normal reflexes. No cranial nerve deficit.  Skin: Skin is  dry.  Psychiatric: She has a normal mood and affect. Her behavior is normal.          Assessment & Plan:  Marland Kitchen.Marland Kitchen.Angela Oliver was seen today for migraine and obesity.  Diagnoses and all orders for this visit:  Intractable chronic migraine without aura and with status migrainosus -     propranolol (INDERAL) 40 MG tablet; Take 1 tablet (40 mg total) by mouth 2 (two) times daily. Due for follow up -     ketorolac (TORADOL) injection 60 mg -     ondansetron (ZOFRAN) 8 MG tablet; Take 1 tablet (8 mg total) by mouth every 8  (eight) hours as needed for nausea or vomiting.  Screening for lipid disorders -     Lipid Panel w/reflex Direct LDL  Screening for diabetes mellitus -     COMPLETE METABOLIC PANEL WITH GFR  Mild persistent asthma without complication  Obesity (BMI 30.0-34.9)  Preventative health care -     Lipid Panel w/reflex Direct LDL -     COMPLETE METABOLIC PANEL WITH GFR   FMLA paperwork filled out. Toradol given for migraine today. zofran for as needed nausea. I think trying Aimovig is a great choice. Will call for sample. Discussed pen and showed how to use. Discussed possible site reaction.   Pt needs screening labs. Ordered today.   Marland Kitchen..Discussed low carb diet with 1500 calories and 80g of protein.  Exercising at least 150 minutes a week.  My Fitness Pal could be a [employer] resource.  Will submit PA for saxenda.

## 2018-03-15 NOTE — Telephone Encounter (Signed)
Can we look into saxenda injection for weight loss? It was denied before and said I might need to write a letter.

## 2018-03-16 NOTE — Telephone Encounter (Signed)
Per PCP Bernie CoveySaxenda has been sent to the insurance company and awaiting response.

## 2018-03-17 ENCOUNTER — Encounter: Payer: Self-pay | Admitting: Physician Assistant

## 2018-03-17 NOTE — Telephone Encounter (Signed)
Called Aimovig representative and she will bring some information by the office for Aimovig approval so I can work on approval for [employer].

## 2018-03-19 ENCOUNTER — Other Ambulatory Visit: Payer: Self-pay | Admitting: Sports Medicine

## 2018-03-19 DIAGNOSIS — M5412 Radiculopathy, cervical region: Secondary | ICD-10-CM

## 2018-03-21 NOTE — Telephone Encounter (Signed)
Information has been sent through Covermymeds for Aimovig. Awaiting determination.

## 2018-04-04 ENCOUNTER — Encounter: Payer: Self-pay | Admitting: Family Medicine

## 2018-04-04 ENCOUNTER — Ambulatory Visit (INDEPENDENT_AMBULATORY_CARE_PROVIDER_SITE_OTHER): Payer: Federal, State, Local not specified - PPO | Admitting: Family Medicine

## 2018-04-04 VITALS — BP 135/82 | HR 77 | Ht 64.0 in | Wt 199.0 lb

## 2018-04-04 DIAGNOSIS — G43711 Chronic migraine without aura, intractable, with status migrainosus: Secondary | ICD-10-CM | POA: Diagnosis not present

## 2018-04-04 MED ORDER — KETOROLAC TROMETHAMINE 60 MG/2ML IM SOLN
60.0000 mg | Freq: Once | INTRAMUSCULAR | Status: AC
  Administered 2018-04-04: 60 mg via INTRAMUSCULAR

## 2018-04-04 MED ORDER — PROMETHAZINE HCL 25 MG/ML IJ SOLN
25.0000 mg | Freq: Once | INTRAMUSCULAR | Status: AC
  Administered 2018-04-04: 25 mg via INTRAMUSCULAR

## 2018-04-04 NOTE — Patient Instructions (Signed)
Not feeling significantly better by tomorrow morning then please call back and I will put you on a 5-day prednisone taper.  If you do take the prednisone.  Just make sure to take with food and water to avoid any stomach upset or irritation.

## 2018-04-04 NOTE — Progress Notes (Signed)
Subjective:    Patient ID: Angela Oliver, female    DOB: 05/07/1966, 52 y.o.   MRN: [id number]  HPI HA started about 48 hours ago. Yesterday with nauseated and vomited. .   Now photosensitivity and watery eyes.  She mostly uses Excedrin Migraine for rescue because her employer will not allow her to use a triptan.  Though she has used Imitrex in the past and it was helpful.  Right now she is currently on propranolol twice a day for prophylaxis.  Recently they were able to get Aimovig approved and so she is just going to schedule an appointment to follow-up to see how to administer the medication.  Her headache was initially mid frontal but now is more left temporal.  She says anytime the barometric branch pressures shifts it tends to trigger headaches for her.   Review of Systems  BP 135/82   Pulse 77   Ht 5\' 4"  (1.626 m)   Wt 199 lb (90.3 kg)   LMP 04/15/2017 (Exact Date)   BMI 34.16 kg/m     Allergies  Allergen Reactions  . Sulfa Antibiotics Hives, Shortness Of Breath, Swelling and Rash    Throat swelling  . Topamax [Topiramate] Other (See Comments)    Severe stomach pain    Past Medical History:  Diagnosis Date  . Anxiety   . Arthritis   . Asthma   . Complication of anesthesia    slow to awaken  . Family history of adverse reaction to anesthesia    mother slow to awaken  . GERD (gastroesophageal reflux disease)   . Herpes simplex   . Irregular heart beat    in the past- no palpations- "skipped a beat"  . Migraines   . Seasonal allergies     Past Surgical History:  Procedure Laterality Date  . APPENDECTOMY    . CHOLECYSTECTOMY  06/01/2016   laproscopic   . CHOLECYSTECTOMY N/A 06/01/2016   Procedure: LAPAROSCOPIC CHOLECYSTECTOMY WITH INTRAOPERATIVE CHOLANGIOGRAM, REMOVAL PERITONEAL NODULE ADJACENT TO SIGMOID COLON;  Surgeon: Avel Peaceodd Rosenbower, MD;  [facility];  Service: General;  Laterality: N/A;  . COLONOSCOPY     age 52  . OVARIAN CYST REMOVAL Left    part of  ovary- cyst hadf ruptured    Social History   Socioeconomic History  . Marital status: Single    Spouse name: Not on file  . Number of children: Not on file  . Years of education: Not on file  . Highest education level: Not on file  Occupational History  . Not on file  Social Needs  . Financial resource strain: Not on file  . Food insecurity:    Worry: Not on file    Inability: Not on file  . Transportation needs:    Medical: Not on file    Non-medical: Not on file  Tobacco Use  . Smoking status: Never Smoker  . Smokeless tobacco: Never Used  Substance and Sexual Activity  . Alcohol use: No  . Drug use: No  . Sexual activity: Not on file  Lifestyle  . Physical activity:    Days per week: Not on file    Minutes per session: Not on file  . Stress: Not on file  Relationships  . Social connections:    Talks on phone: Not on file    Gets together: Not on file    Attends religious service: Not on file    Active member of club or organization: Not on file  Attends meetings of clubs or organizations: Not on file    Relationship status: Not on file  . Intimate partner violence:    Fear of current or ex partner: Not on file    Emotionally abused: Not on file    Physically abused: Not on file    Forced sexual activity: Not on file  Other Topics Concern  . Not on file  Social History Narrative  . Not on file    Family History  Problem Relation Age of Onset  . Cancer Mother        breast cancer  . Alcohol abuse Father   . Diabetes Father   . Hyperlipidemia Father   . Asthma Other     Outpatient Encounter Medications as of 04/04/2018  Medication Sig  . Albuterol Sulfate (PROAIR RESPICLICK) 108 (90 Base) MCG/ACT AEPB Inhale 2 puffs into the lungs every 4 (four) hours as needed (shortness of breath/ wheezing).  . celecoxib (CELEBREX) 200 MG capsule TAKE 1 TO 2 TABLETS BY MOUTH DAILY AS NEEDED FOR PAIN.  . cetirizine (ZYRTEC) 10 MG tablet Take 10 mg by mouth daily.   . diclofenac sodium (VOLTAREN) 1 % GEL Apply 4 g topically 4 (four) times daily. To affected joint.  . DULoxetine (CYMBALTA) 30 MG capsule TAKE 1 CAPSULE BY MOUTH EVERY DAY  . meloxicam (MOBIC) 15 MG tablet TAKE 1 TO 2 TABLETS BY MOUTH EVERY DAY FOR 2 WEEKS THEN AS NEEDED FOR JOINT PAIN  . montelukast (SINGULAIR) 10 MG tablet TAKE 1 TABLET (10 MG TOTAL) BY MOUTH DAILY.  Marland Kitchen omeprazole (PRILOSEC) 40 MG capsule Take 1 capsule (40 mg total) by mouth daily.  . ondansetron (ZOFRAN) 8 MG tablet Take 1 tablet (8 mg total) by mouth every 8 (eight) hours as needed for nausea or vomiting.  . propranolol (INDERAL) 40 MG tablet Take 1 tablet (40 mg total) by mouth 2 (two) times daily. Due for follow up  . QVAR 80 MCG/ACT inhaler INHALE 2 PUFFS INTO THE LUNGS 2 (TWO) TIMES DAILY.   Facility-Administered Encounter Medications as of 04/04/2018  Medication  . ketorolac (TORADOL) injection 60 mg  . promethazine (PHENERGAN) injection 25 mg         Objective:   Physical Exam  Constitutional: She is oriented to person, place, and time. She appears well-developed and well-nourished.  HENT:  Head: Normocephalic and atraumatic.  Cardiovascular: Normal rate, regular rhythm and normal heart sounds.  Pulmonary/Chest: Effort normal and breath sounds normal.  Neurological: She is alert and oriented to person, place, and time.  Skin: Skin is warm and dry.  Psychiatric: She has a normal mood and affect. Her behavior is normal.       Assessment & Plan:  Status migrainous-we will treat acutely with Toradol and Phenergan IM.  If not some relief by tomorrow morning then have her call back the office and will start a steroid taper over the next 5 days.  Its unfortunate that she cannot really use any additional rescue medications besides Excedrin.  For prophylaxis she will follow-up to start Aimovig.

## 2018-04-04 NOTE — Telephone Encounter (Signed)
[facility] that Aimovig was approved from 02/19/2018 through 09/17/2018. Pharmacy notified and forms sent to scan.   Reference ID: [id number].

## 2018-04-18 ENCOUNTER — Ambulatory Visit (INDEPENDENT_AMBULATORY_CARE_PROVIDER_SITE_OTHER): Payer: Federal, State, Local not specified - PPO | Admitting: Physician Assistant

## 2018-04-18 DIAGNOSIS — G43711 Chronic migraine without aura, intractable, with status migrainosus: Secondary | ICD-10-CM | POA: Diagnosis not present

## 2018-04-18 MED ORDER — ERENUMAB-AOOE 140 MG/ML ~~LOC~~ SOAJ: 1.0000 "pen " | SUBCUTANEOUS | 2 refills | Status: DC

## 2018-04-18 NOTE — Progress Notes (Signed)
Aimovig  ZOX#[id number] Exp. Date 07/2019.  Pt was instructed how to use. Will send over Aimovig for once month injection.

## 2018-04-18 NOTE — Telephone Encounter (Signed)
Patient will come in the office this afternoon to pick up samples. 2 samples of  dose came in the office 04/18/2018. Patient will pick up in the afternoon.

## 2018-04-24 ENCOUNTER — Ambulatory Visit: Payer: Federal, State, Local not specified - PPO | Admitting: Physician Assistant

## 2018-05-08 ENCOUNTER — Other Ambulatory Visit: Payer: Self-pay | Admitting: Sports Medicine

## 2018-05-08 DIAGNOSIS — M5412 Radiculopathy, cervical region: Secondary | ICD-10-CM

## 2018-05-09 NOTE — Telephone Encounter (Signed)
To PCP

## 2018-05-14 ENCOUNTER — Other Ambulatory Visit: Payer: Self-pay | Admitting: Sports Medicine

## 2018-05-18 ENCOUNTER — Other Ambulatory Visit: Payer: Self-pay | Admitting: Physician Assistant

## 2018-05-23 ENCOUNTER — Other Ambulatory Visit: Payer: Self-pay | Admitting: *Deleted

## 2018-05-23 MED ORDER — ERENUMAB-AOOE 140 MG/ML ~~LOC~~ SOAJ: 1.0000 "pen " | SUBCUTANEOUS | 0 refills | Status: DC

## 2018-05-31 ENCOUNTER — Other Ambulatory Visit: Payer: Self-pay | Admitting: Physician Assistant

## 2018-05-31 MED ORDER — ERENUMAB-AOOE 140 MG/ML ~~LOC~~ SOAJ: 1.0000 "pen " | SUBCUTANEOUS | 0 refills | Status: DC

## 2018-06-07 ENCOUNTER — Telehealth: Payer: Self-pay | Admitting: Physician Assistant

## 2018-06-07 MED ORDER — ERENUMAB-AOOE 140 MG/ML ~~LOC~~ SOAJ: 1.0000 "pen " | SUBCUTANEOUS | 0 refills | Status: DC

## 2018-06-07 NOTE — Telephone Encounter (Signed)
Ms. Angela Oliver called. She said her migraine meds(shots) is too expensive through [facility] and can we send it through [facility]. Difference in cost  is $45 versus $240.

## 2018-06-07 NOTE — Telephone Encounter (Signed)
Rx sent 

## 2018-06-11 ENCOUNTER — Other Ambulatory Visit: Payer: Self-pay | Admitting: Physician Assistant

## 2018-06-11 DIAGNOSIS — M5412 Radiculopathy, cervical region: Secondary | ICD-10-CM

## 2018-06-16 ENCOUNTER — Other Ambulatory Visit: Payer: Self-pay | Admitting: Physician Assistant

## 2018-07-12 ENCOUNTER — Encounter: Payer: Self-pay | Admitting: Physician Assistant

## 2018-07-12 ENCOUNTER — Ambulatory Visit (INDEPENDENT_AMBULATORY_CARE_PROVIDER_SITE_OTHER): Payer: Federal, State, Local not specified - PPO | Admitting: Physician Assistant

## 2018-07-12 VITALS — BP 103/71 | HR 62 | Ht 64.0 in | Wt 198.0 lb

## 2018-07-12 DIAGNOSIS — M5412 Radiculopathy, cervical region: Secondary | ICD-10-CM | POA: Diagnosis not present

## 2018-07-12 DIAGNOSIS — M7062 Trochanteric bursitis, left hip: Secondary | ICD-10-CM | POA: Diagnosis not present

## 2018-07-12 DIAGNOSIS — G43711 Chronic migraine without aura, intractable, with status migrainosus: Secondary | ICD-10-CM

## 2018-07-12 DIAGNOSIS — J4530 Mild persistent asthma, uncomplicated: Secondary | ICD-10-CM | POA: Diagnosis not present

## 2018-07-12 DIAGNOSIS — M7061 Trochanteric bursitis, right hip: Secondary | ICD-10-CM

## 2018-07-12 DIAGNOSIS — M545 Low back pain, unspecified: Secondary | ICD-10-CM

## 2018-07-12 DIAGNOSIS — K219 Gastro-esophageal reflux disease without esophagitis: Secondary | ICD-10-CM

## 2018-07-12 DIAGNOSIS — G8929 Other chronic pain: Secondary | ICD-10-CM

## 2018-07-12 DIAGNOSIS — J3089 Other allergic rhinitis: Secondary | ICD-10-CM

## 2018-07-12 MED ORDER — PROPRANOLOL HCL 40 MG PO TABS: 40.0000 mg | ORAL_TABLET | Freq: Two times a day (BID) | ORAL | 3 refills | Status: DC

## 2018-07-12 MED ORDER — MONTELUKAST SODIUM 10 MG PO TABS: ORAL_TABLET | ORAL | 3 refills | Status: DC

## 2018-07-12 MED ORDER — OMEPRAZOLE 40 MG PO CPDR: DELAYED_RELEASE_CAPSULE | ORAL | 3 refills | Status: DC

## 2018-07-12 MED ORDER — CELECOXIB 200 MG PO CAPS: ORAL_CAPSULE | ORAL | 3 refills | Status: DC

## 2018-07-12 MED ORDER — BECLOMETHASONE DIPROPIONATE 80 MCG/ACT IN AERS: 2.0000 | INHALATION_SPRAY | Freq: Two times a day (BID) | RESPIRATORY_TRACT | 3 refills | Status: DC

## 2018-07-12 MED ORDER — DULOXETINE HCL 30 MG PO CPEP
ORAL_CAPSULE | ORAL | 3 refills | Status: DC
Start: 2018-07-12 — End: 2018-12-25

## 2018-07-12 MED ORDER — CETIRIZINE HCL 10 MG PO TABS: 10.0000 mg | ORAL_TABLET | Freq: Every day | ORAL | 3 refills | Status: AC

## 2018-07-12 MED ORDER — ERENUMAB-AOOE 140 MG/ML ~~LOC~~ SOAJ: 1.0000 "pen " | SUBCUTANEOUS | 0 refills | Status: DC

## 2018-07-12 NOTE — Patient Instructions (Signed)
Hip Bursitis Hip bursitis is swelling of a fluid-filled sac (bursa) in your hip. This swelling (inflammation) can be painful. This condition may come and go over time. Follow these instructions at home: Medicines  Take over-the-counter and prescription medicines only as told by your doctor.  Do not drive or use heavy machinery while taking prescription pain medicine, or as told by your doctor.  If you were prescribed an antibiotic medicine, take it as told by your doctor. Do not stop taking the antibiotic even if you start to feel better. Activity  Return to your normal activities as told by your doctor. Ask your doctor what activities are safe for you.  Rest and protect your hip until you feel better. General instructions  Wear wraps that put pressure on your hip (compression wraps) only as told by your doctor.  Raise (elevate) your hip above the level of your heart as much as you can. To do this, try putting a pillow under your hips while you lie down. Stop if this causes pain.  Do not use your hip to support your body weight until your doctor says that you can.  Use crutches as told by your doctor.  Gently rub and stretch your injured area as often as is comfortable.  Keep all follow-up visits as told by your doctor. This is important. How is this prevented?  Exercise regularly, as told by your doctor.  Warm up and stretch before being active.  Cool down and stretch after being active.  Avoid activities that bother your hip or cause pain.  Avoid sitting down for long periods at a time. Contact a doctor if:  You have a fever.  You get new symptoms.  You have trouble walking.  You have trouble doing everyday activities.  You have pain that gets worse.  You have pain that does not get better with medicine.  You get red skin on your hip area.  You get a feeling of warmth in your hip area. Get help right away if:  You cannot move your hip.  You have very bad  pain. This information is not intended to replace advice given to you by your health care provider. Make sure you discuss any questions you have with your health care provider. Document Released: 01/01/2011 Document Revised: 05/06/2016 Document Reviewed: 07/01/2015 Elsevier Interactive Patient Education  2018 Elsevier Inc.  

## 2018-07-12 NOTE — Progress Notes (Signed)
Subjective:    Patient ID: Jamina Macbeth, female    DOB: Oct 14, 1966, 52 y.o.   MRN: [id number]  HPI Pt is a 52 yo female who presents to the clinic with bilateral lateral hip pain that feels like her bursitis acting up. She has had injections before and helped a lot. It has been over a year since having any problems. She admits to walking more at work but denies any injuries.   Pt is taking celebrex once a day and mobic once a day. She can tell if she misses either.   Pt request medication refills. Overall her allergies, migraines, asthma, radiculopathy, chronic low back pain is doing great.    .. Active Ambulatory Problems    Diagnosis Date Noted  . Allergic rhinitis 06/09/2011  . Asthma 06/09/2011  . GERD 06/09/2011  . 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 Herpes simplex 10/23/2012  . 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/2012  . Trochanteric bursitis of both hips 11/03/2012  . Trochanteric bursitis of right hip 11/22/2013  . Low back pain 02/19/2014  . 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 C7 radiculopathy 12/01/2015  . Obesity (BMI 30.0-34.9) 01/27/2017  . Pain of right breast 07/07/2017  . Family history of breast cancer in mother 07/07/2017  . Abnormal weight gain 07/07/2017  . Carpal tunnel syndrome on both sides 11/29/2017  . Dysfunction of left eustachian tube 02/01/2018   Resolved Ambulatory Problems    Diagnosis Date Noted  . RUQ PAIN 06/09/2011  . Migraines 10/23/2012  . Neck muscle spasm 11/03/2012  . Peroneal tendinitis of left lower extremity 12/29/2012  . Left foot pain 11/22/2013  . Foot pain, left 02/19/2014  . Poison ivy dermatitis 07/04/2014  . Acute bronchitis 09/16/2015  . Paresthesia of both hands 12/01/2015  . Calculus of gallbladder without cholecystitis without obstruction 05/03/2016  . Symptomatic cholelithiasis 06/01/2016  . S/P cholecystectomy 07/16/2016  . Low back sprain 07/28/2016   Past Medical  History:  Diagnosis Date  . Anxiety   . Arthritis   . Asthma   . Complication of anesthesia   . Family history of adverse reaction to anesthesia   . GERD (gastroesophageal reflux disease)   . Herpes simplex   . Irregular heart beat   . Migraines   . Seasonal allergies       Review of Systems See HPI.     Objective:   Physical Exam  Constitutional: She is oriented to person, place, and time. She appears well-developed and well-nourished.  HENT:  Head: Normocephalic and atraumatic.  Cardiovascular: Normal rate.  Pulmonary/Chest: Effort normal.  Musculoskeletal:  Bilateral tenderness over the greater trochanter.  NROM and 5/5 strength of bilateral lower extremity.    Neurological: She is alert and oriented to person, place, and time.  Psychiatric: She has a normal mood and affect. Her behavior is normal.          Assessment & Plan:  Marland KitchenMarland KitchenDiagnoses and all orders for this visit:  Trochanteric bursitis of both hips -     celecoxib (CELEBREX) 200 MG capsule; TAKE 1 TO 2 TABLETS BY MOUTH DAILY AS NEEDED FOR PAIN.  Chronic low back pain without sciatica, unspecified back pain laterality -     celecoxib (CELEBREX) 200 MG capsule; TAKE 1 TO 2 TABLETS BY MOUTH DAILY AS NEEDED FOR PAIN.  C7 radiculopathy -     DULoxetine (CYMBALTA) 30 MG capsule; TAKE 1 CAPSULE BY MOUTH EVERY DAY  Mild persistent asthma without complication -  montelukast (SINGULAIR) 10 MG tablet; TAKE 1 TABLET (10 MG TOTAL) BY MOUTH DAILY. -     beclomethasone (QVAR) 80 MCG/ACT inhaler; Inhale 2 puffs into the lungs 2 (two) times daily.  Non-seasonal allergic rhinitis, unspecified trigger -     cetirizine (ZYRTEC) 10 MG tablet; Take 1 tablet (10 mg total) by mouth daily. -     montelukast (SINGULAIR) 10 MG tablet; TAKE 1 TABLET (10 MG TOTAL) BY MOUTH DAILY.  Intractable chronic migraine without aura and with status migrainosus -     propranolol (INDERAL) 40 MG tablet; Take 1 tablet (40 mg total) by  mouth 2 (two) times daily. -     Erenumab-aooe (AIMOVIG) 140 MG/ML SOAJ; Inject 1 pen into the skin every 30 (thirty) days.  Gastroesophageal reflux disease without esophagitis -     omeprazole (PRILOSEC) 40 MG capsule; TAKE 1 CAPSULE BY MOUTH EVERY DAY  discussed not taking mobic and celebrex together she needs to take one or the other.   Injection Procedure Note Hubert AzureBrenda Twardowski [id number] 05/08/1966  Procedure: Injection Indications: greater trochanter bilateral injection   Procedure Details Consent: Risks of procedure as well as the alternatives and risks of each were explained to the (patient/caregiver).  Consent for procedure obtained. Time Out: Verified patient identification, verified procedure, site/side was marked, verified correct patient position, special equipment/implants available, medications/allergies/relevent history reviewed, required imaging and test results available.  Performed   Local Anesthesia Used:Ethyl Chloride Spray Injection of depo medrol 40mg  9cc with 1 percent lidocaine without epi 1cc A sterile dressing was applied.  Patient did tolerate procedure well.  Tandy GawJade Tanequa Kretz

## 2018-07-20 ENCOUNTER — Other Ambulatory Visit: Payer: Self-pay

## 2018-07-20 ENCOUNTER — Emergency Department
Admission: EM | Admit: 2018-07-20 | Discharge: 2018-07-20 | Disposition: A | Discharge status: Home / Self Care | Payer: Federal, State, Local not specified - PPO | Source: Home / Self Care | Attending: Emergency Medicine | Admitting: Emergency Medicine

## 2018-07-20 ENCOUNTER — Encounter: Payer: Self-pay | Admitting: Emergency Medicine

## 2018-07-20 DIAGNOSIS — R3129 Other microscopic hematuria: Secondary | ICD-10-CM

## 2018-07-20 DIAGNOSIS — G43101 Migraine with aura, not intractable, with status migrainosus: Secondary | ICD-10-CM

## 2018-07-20 DIAGNOSIS — E860 Dehydration: Secondary | ICD-10-CM

## 2018-07-20 DIAGNOSIS — R531 Weakness: Secondary | ICD-10-CM

## 2018-07-20 LAB — POCT CBC W AUTO DIFF (K'VILLE URGENT CARE)

## 2018-07-20 LAB — POCT URINALYSIS DIP (MANUAL ENTRY)
BILIRUBIN UA: NEGATIVE mg/dL
Bilirubin, UA: NEGATIVE
Glucose, UA: NEGATIVE mg/dL
LEUKOCYTES UA: NEGATIVE
Nitrite, UA: NEGATIVE
PH UA: 5.5 (ref 5.0–8.0)
UROBILINOGEN UA: 0.2 U/dL

## 2018-07-20 LAB — POCT FASTING CBG KUC MANUAL ENTRY: POCT GLUCOSE (MANUAL ENTRY) KUC: 99 mg/dL (ref 70–99)

## 2018-07-20 MED ORDER — ONDANSETRON 4 MG PO TBDP
4.0000 mg | ORAL_TABLET | Freq: Once | ORAL | Status: AC
  Administered 2018-07-20: 4 mg via ORAL

## 2018-07-20 MED ORDER — KETOROLAC TROMETHAMINE 60 MG/2ML IM SOLN
60.0000 mg | Freq: Once | INTRAMUSCULAR | Status: AC
  Administered 2018-07-20: 60 mg via INTRAMUSCULAR

## 2018-07-20 NOTE — ED Triage Notes (Signed)
Patient has been working in heat past few days; feels like she has been in and out of heat exhaustion. Feels weak and tired; aches in general from heavy lifting.Nothing to eat since awaking at 1100. Has bruise on left lower arm.

## 2018-07-20 NOTE — Discharge Instructions (Addendum)
There is blood in your urine which will need follow-up with your primary care physician. We will call you with results of your blood work tomorrow. Please increase your fluid intake primarily water or Gatorade. Please follow-up with your primary care physician tomorrow if your headache is persistent.

## 2018-07-20 NOTE — ED Provider Notes (Signed)
[facility]    CSN: [id number] Arrival date & time: 07/20/18  1238     History   Chief Complaint Chief Complaint  Patient presents with  . Weakness  . Migraine  . Dehydration    HPI Keyla Milone is a 52 y.o. female.  Patient states that she became overheated last night at work.  She works for [employer].  They were doing a good amount of lifting in the heat yesterday and she became lightheaded and weak.  She has been battling a migraine headache over the past few days.  It is not unusual for her to suffer from migraines.  She has had no nausea or vomiting.  Her major complaint today is aching of her muscles in her back and legs.  She has noted that her urine is dark. HPI  Past Medical History:  Diagnosis Date  . Anxiety   . Arthritis   . Asthma   . Complication of anesthesia    slow to awaken  . Family history of adverse reaction to anesthesia    mother slow to awaken  . GERD (gastroesophageal reflux disease)   . Herpes simplex   . Irregular heart beat    in the past- no palpations- "skipped a beat"  . Migraines   . Seasonal allergies     Patient Active Problem List   Diagnosis Date Noted  . Dysfunction of left eustachian tube 02/01/2018  . Carpal tunnel syndrome on both sides 11/29/2017  . Pain of right breast 07/07/2017  . Family history of breast cancer in mother 07/07/2017  . Abnormal weight gain 07/07/2017  . Obesity (BMI 30.0-34.9) 01/27/2017  . C7 radiculopathy 12/01/2015  . 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 Low back pain 02/19/2014  . Trochanteric bursitis of right hip 11/22/2013  . 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/2012  . Trochanteric bursitis of both hips 11/03/2012  . 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 Herpes simplex 10/23/2012  . Allergic rhinitis 06/09/2011  . Asthma 06/09/2011  . GERD 06/09/2011    Past Surgical History:  Procedure Laterality Date  . APPENDECTOMY    .  CHOLECYSTECTOMY  06/01/2016   laproscopic   . CHOLECYSTECTOMY N/A 06/01/2016   Procedure: LAPAROSCOPIC CHOLECYSTECTOMY WITH INTRAOPERATIVE CHOLANGIOGRAM, REMOVAL PERITONEAL NODULE ADJACENT TO SIGMOID COLON;  Surgeon: Avel Peace, MD;  Location: [facility] OR;  Service: General;  Laterality: N/A;  . COLONOSCOPY     age 60  . OVARIAN CYST REMOVAL Left    part of ovary- cyst hadf ruptured    OB History   None      Home Medications    Prior to Admission medications   Medication Sig Start Date End Date Taking? Authorizing Provider  Albuterol Sulfate (PROAIR RESPICLICK) 108 (90 Base) MCG/ACT AEPB Inhale 2 puffs into the lungs every 4 (four) hours as needed (shortness of breath/ wheezing). 01/23/18   Carlis Stable, PA-C  beclomethasone (QVAR) 80 MCG/ACT inhaler Inhale 2 puffs into the lungs 2 (two) times daily. 07/12/18   Breeback, Jade L, PA-C  celecoxib (CELEBREX) 200 MG capsule TAKE 1 TO 2 TABLETS BY MOUTH DAILY AS NEEDED FOR PAIN. 07/12/18   Breeback, Jade L, PA-C  cetirizine (ZYRTEC) 10 MG tablet Take 1 tablet (10 mg total) by mouth daily. 07/12/18   Breeback, Jade L, PA-C  diclofenac sodium (VOLTAREN) 1 % GEL Apply 4 g topically 4 (four) times daily. To affected joint. 07/21/17   Rodolph Bong, MD  DULoxetine (CYMBALTA)  30 MG capsule TAKE 1 CAPSULE BY MOUTH EVERY DAY 07/12/18   Breeback, Jade L, PA-C  Erenumab-aooe (AIMOVIG) 140 MG/ML SOAJ Inject 1 pen into the skin every 30 (thirty) days. 07/12/18   Breeback, Jade L, PA-C  meloxicam (MOBIC) 15 MG tablet TAKE 1 TABLET BY MOUTH DAILY AS NEEDED FOR PAIN (FOR JOINT PAIN). DUE FOR FOLLOW UP WITH [facility] 06/16/18   Breeback, Jade L, PA-C  montelukast (SINGULAIR) 10 MG tablet TAKE 1 TABLET (10 MG TOTAL) BY MOUTH DAILY. 07/12/18   Breeback, Jade L, PA-C  omeprazole (PRILOSEC) 40 MG capsule TAKE 1 CAPSULE BY MOUTH EVERY DAY 07/12/18   Breeback, Jade L, PA-C  ondansetron (ZOFRAN) 8 MG tablet Take 1 tablet (8 mg total) by mouth every 8 (eight)  hours as needed for nausea or vomiting. 03/15/18   Breeback, Lonna CobbJade L, PA-C  propranolol (INDERAL) 40 MG tablet Take 1 tablet (40 mg total) by mouth 2 (two) times daily. 07/12/18   Jomarie LongsBreeback, Jade L, PA-C    Family History Family History  Problem Relation Age of Onset  . Cancer Mother        breast cancer  . Alcohol abuse Father   . Diabetes Father   . Hyperlipidemia Father   . Asthma Other     Social History Social History   Tobacco Use  . Smoking status: Never Smoker  . Smokeless tobacco: Never Used  Substance Use Topics  . Alcohol use: No  . Drug use: No     Allergies   Sulfa antibiotics and Topamax [topiramate]   Review of Systems Review of Systems  Constitutional: Positive for fatigue. Negative for fever.  HENT: Negative.   Respiratory: Negative.   Genitourinary:       Patient has noticed her urine being dark.  Musculoskeletal:       She is having aching in her arms back and legs.  Neurological: Positive for headaches.     Physical Exam Triage Vital Signs ED Triage Vitals  Enc Vitals Group     BP 07/20/18 1339 118/76     Pulse Rate 07/20/18 1339 67     Resp 07/20/18 1339 18     Temp 07/20/18 1339 98.4 F (36.9 C)     Temp Source 07/20/18 1339 Oral     SpO2 07/20/18 1339 98 %     Weight 07/20/18 1340 192 lb (87.1 kg)     Height 07/20/18 1340 5\' 4"  (1.626 m)     Head Circumference --      Peak Flow --      Pain Score 07/20/18 1339 0     Pain Loc --      Pain Edu? --      Excl. in GC? --    No data found.  Updated Vital Signs BP 118/76 (BP Location: Right Arm)   Pulse 67   Temp 98.4 F (36.9 C) (Oral)   Resp 18   Ht 5\' 4"  (1.626 m)   Wt 87.1 kg   LMP 04/15/2017 (Exact Date)   SpO2 98%   BMI 32.96 kg/m   Visual Acuity Right Eye Distance:   Left Eye Distance:   Bilateral Distance:    Right Eye Near:   Left Eye Near:    Bilateral Near:     Physical Exam   UC Treatments / Results  Labs (all labs ordered are listed, but only abnormal  results are displayed) Labs Reviewed  POCT URINALYSIS DIP (MANUAL ENTRY) - Abnormal; Notable for  the following components:      Result Value   Clarity, UA cloudy (*)    Spec Grav, UA >=1.030 (*)    Blood, UA large (*)    Protein Ur, POC =30 (*)    All other components within normal limits  COMPLETE METABOLIC PANEL WITH GFR  CK  POCT FASTING CBG [facility] MANUAL ENTRY  POCT CBC W AUTO DIFF ([facility])    EKG None  Radiology No results found.  Procedures Procedures (including critical care time)  Medications Ordered in UC Medications  ketorolac (TORADOL) injection 60 mg (60 mg Intramuscular Given 07/20/18 1415)  ondansetron (ZOFRAN-ODT) disintegrating tablet 4 mg (4 mg Oral Given 07/20/18 1415)    Initial Impression / Assessment and Plan / UC Course  I have reviewed the triage vital signs and the nursing notes.  Pertinent labs & imaging results that were available during my care of the patient were reviewed by me and considered in my medical decision making (see chart for details). Specific gravity was still 1.030.  She was encouraged to drink more fluids.  I have taken her out of work until Monday.  We will check a CBC, CK, and comprehensive metabolic panel to be sure she has not had any muscle damage affecting her kidneys.  She was given Toradol 60 along with 4 mg Odansartan  for her headache.  There was blood in her urine which will need to be followed up.  There is a good deal of muscle aching so certainly she could have rhabdomyolysis.     Final Clinical Impressions(s) / UC Diagnoses   Final diagnoses:  Dehydration  Migraine with aura and with status migrainosus, not intractable  Hematuria, microscopic     Discharge Instructions     There is blood in your urine which will need follow-up with your primary care physician. We will call you with results of your blood work tomorrow. Please increase your fluid intake primarily water or Gatorade. Please follow-up with  your primary care physician tomorrow if your headache is persistent.    ED Prescriptions    None     Controlled Substance Prescriptions [place] Controlled Substance Registry consulted? Not Applicable   Collene Gobble, MD 07/20/18 1450

## 2018-07-21 ENCOUNTER — Telehealth: Payer: Self-pay | Admitting: Emergency Medicine

## 2018-07-21 LAB — COMPLETE METABOLIC PANEL WITH GFR
AG RATIO: 1.8 (calc) (ref 1.0–2.5)
ALT: 19 U/L (ref 6–29)
AST: 19 U/L (ref 10–35)
Albumin: 4.8 g/dL (ref 3.6–5.1)
Alkaline phosphatase (APISO): 86 U/L (ref 33–130)
BUN/Creatinine Ratio: 31 (calc) — ABNORMAL HIGH (ref 6–22)
BUN: 26 mg/dL — ABNORMAL HIGH (ref 7–25)
CALCIUM: 9.5 mg/dL (ref 8.6–10.4)
CO2: 26 mmol/L (ref 20–32)
Chloride: 103 mmol/L (ref 98–110)
Creat: 0.85 mg/dL (ref 0.50–1.05)
GFR, EST AFRICAN AMERICAN: 90 mL/min/{1.73_m2} (ref >=60)
GFR, EST NON AFRICAN AMERICAN: 78 mL/min/{1.73_m2} (ref >=60)
GLOBULIN: 2.6 g/dL (ref 1.9–3.7)
Glucose, Bld: 112 mg/dL — ABNORMAL HIGH (ref 65–99)
POTASSIUM: 3.9 mmol/L (ref 3.5–5.3)
Sodium: 140 mmol/L (ref 135–146)
Total Bilirubin: 1.8 mg/dL — ABNORMAL HIGH (ref 0.2–1.2)
Total Protein: 7.4 g/dL (ref 6.1–8.1)

## 2018-07-21 LAB — CK: Total CK: 158 U/L — ABNORMAL HIGH (ref 29–143)

## 2018-07-31 ENCOUNTER — Encounter: Payer: Self-pay | Admitting: Family Medicine

## 2018-07-31 ENCOUNTER — Ambulatory Visit (INDEPENDENT_AMBULATORY_CARE_PROVIDER_SITE_OTHER): Payer: Federal, State, Local not specified - PPO | Admitting: Family Medicine

## 2018-07-31 VITALS — BP 109/53 | HR 73 | Ht 64.0 in | Wt 193.0 lb

## 2018-07-31 DIAGNOSIS — E860 Dehydration: Secondary | ICD-10-CM

## 2018-07-31 DIAGNOSIS — R233 Spontaneous ecchymoses: Secondary | ICD-10-CM

## 2018-07-31 DIAGNOSIS — G43711 Chronic migraine without aura, intractable, with status migrainosus: Secondary | ICD-10-CM

## 2018-07-31 DIAGNOSIS — R238 Other skin changes: Secondary | ICD-10-CM

## 2018-07-31 DIAGNOSIS — R319 Hematuria, unspecified: Secondary | ICD-10-CM

## 2018-07-31 LAB — POCT URINALYSIS DIPSTICK
Bilirubin, UA: NEGATIVE
Blood, UA: NEGATIVE
Glucose, UA: NEGATIVE
Nitrite, UA: NEGATIVE
Protein, UA: NEGATIVE
Urobilinogen, UA: 0.2 E.U./dL
pH, UA: 5.5 (ref 5.0–8.0)

## 2018-07-31 NOTE — Progress Notes (Signed)
Subjective:    Patient ID: Angela Oliver, female    DOB: 03/13/1966, 52 y.o.   MRN: [id number]  HPI  52 year old female comes in today to discuss follow-up care for her migraine headaches.  She was been having them since age 52.  Previously she would get about 2 to 3/month but more recently she is been getting 5 to 6/month.  She gets nausea with them.  She was seen at the urgent care here in our building on August 8 for dehydration and migraine headache.  She had been working out in the heat and became lightheaded and weak.  He is actually not currently on Aimovig.  She has not been able to pick it up at the pharmacy because of the cost.  She did want to check into mail order to see if that would be an option.  She just feels like with the migraines she is in a brain fog at times.  Weather changes are a big trigger for her.  Is actually supposed to rain later this week and she can already tell the barometric pressure is changing because she is been having a headache.  She also wanted to discuss that she is been bruising easily is actually noticed this for a couple of weeks.  In fact she has significant bruises on her forearms where she accidentally hit the edges of a cardboard box while she was trying to lift it and put it down.  In fact the one on her left arm has a couple of hard areas within it.  Is been very tender to touch.  Also when she went to urgent care she was noted to have some blood in her urine.  Regards to the dehydration-she is actually been working diligently to try to increase her fluids.  She is been getting about 4 ounces a day.  She is been hydrating with water, coconut water, boost, and some over-the-counter hydration solutions for adults.  When she had lab work done at urgent care 2 weeks ago she had normal platelets and normal liver enzymes.  She did have mild elevation in BUN as well as a CK level.   Review of Systems  BP (!) 109/53   Pulse 73   Ht 5\' 4"  (1.626 m)   Wt 193  lb (87.5 kg)   LMP 04/15/2017 (Exact Date)   BMI 33.13 kg/m     Allergies  Allergen Reactions  . Sulfa Antibiotics Hives, Shortness Of Breath, Swelling and Rash    Throat swelling  . Topamax [Topiramate] Other (See Comments)    Severe stomach pain    Past Medical History:  Diagnosis Date  . Anxiety   . Arthritis   . Asthma   . Complication of anesthesia    slow to awaken  . Family history of adverse reaction to anesthesia    mother slow to awaken  . GERD (gastroesophageal reflux disease)   . Herpes simplex   . Irregular heart beat    in the past- no palpations- "skipped a beat"  . Migraines   . Seasonal allergies     Past Surgical History:  Procedure Laterality Date  . APPENDECTOMY    . CHOLECYSTECTOMY  06/01/2016   laproscopic   . CHOLECYSTECTOMY N/A 06/01/2016   Procedure: LAPAROSCOPIC CHOLECYSTECTOMY WITH INTRAOPERATIVE CHOLANGIOGRAM, REMOVAL PERITONEAL NODULE ADJACENT TO SIGMOID COLON;  Surgeon: Avel Peaceodd Rosenbower, MD;  [facility];  Service: General;  Laterality: N/A;  . COLONOSCOPY     age 52  .  OVARIAN CYST REMOVAL Left    part of ovary- cyst hadf ruptured    Social History   Socioeconomic History  . Marital status: Single    Spouse name: Not on file  . Number of children: Not on file  . Years of education: Not on file  . Highest education level: Not on file  Occupational History  . Not on file  Social Needs  . Financial resource strain: Not on file  . Food insecurity:    Worry: Not on file    Inability: Not on file  . Transportation needs:    Medical: Not on file    Non-medical: Not on file  Tobacco Use  . Smoking status: Never Smoker  . Smokeless tobacco: Never Used  Substance and Sexual Activity  . Alcohol use: No  . Drug use: No  . Sexual activity: Not on file  Lifestyle  . Physical activity:    Days per week: Not on file    Minutes per session: Not on file  . Stress: Not on file  Relationships  . Social connections:    Talks on  phone: Not on file    Gets together: Not on file    Attends religious service: Not on file    Active member of club or organization: Not on file    Attends meetings of clubs or organizations: Not on file    Relationship status: Not on file  . Intimate partner violence:    Fear of current or ex partner: Not on file    Emotionally abused: Not on file    Physically abused: Not on file    Forced sexual activity: Not on file  Other Topics Concern  . Not on file  Social History Narrative  . Not on file    Family History  Problem Relation Age of Onset  . Cancer Mother        breast cancer  . Alcohol abuse Father   . Diabetes Father   . Hyperlipidemia Father   . Asthma Other     Outpatient Encounter Medications as of 07/31/2018  Medication Sig  . Albuterol Sulfate (PROAIR RESPICLICK) 108 (90 Base) MCG/ACT AEPB Inhale 2 puffs into the lungs every 4 (four) hours as needed (shortness of breath/ wheezing).  . beclomethasone (QVAR) 80 MCG/ACT inhaler Inhale 2 puffs into the lungs 2 (two) times daily.  . celecoxib (CELEBREX) 200 MG capsule TAKE 1 TO 2 TABLETS BY MOUTH DAILY AS NEEDED FOR PAIN.  . cetirizine (ZYRTEC) 10 MG tablet Take 1 tablet (10 mg total) by mouth daily.  . diclofenac sodium (VOLTAREN) 1 % GEL Apply 4 g topically 4 (four) times daily. To affected joint.  . DULoxetine (CYMBALTA) 30 MG capsule TAKE 1 CAPSULE BY MOUTH EVERY DAY  . Erenumab-aooe (AIMOVIG) 140 MG/ML SOAJ Inject 1 pen into the skin every 30 (thirty) days.  . meloxicam (MOBIC) 15 MG tablet TAKE 1 TABLET BY MOUTH DAILY AS NEEDED FOR PAIN (FOR JOINT PAIN). DUE FOR FOLLOW UP WITH SPORTS MED  . montelukast (SINGULAIR) 10 MG tablet TAKE 1 TABLET (10 MG TOTAL) BY MOUTH DAILY.  Marland Kitchen. omeprazole (PRILOSEC) 40 MG capsule TAKE 1 CAPSULE BY MOUTH EVERY DAY  . ondansetron (ZOFRAN) 8 MG tablet Take 1 tablet (8 mg total) by mouth every 8 (eight) hours as needed for nausea or vomiting.  . propranolol (INDERAL) 40 MG tablet Take 1  tablet (40 mg total) by mouth 2 (two) times daily.   No facility-administered encounter medications  on file as of 07/31/2018.         Objective:   Physical Exam  Constitutional: She is oriented to person, place, and time. She appears well-developed and well-nourished.  HENT:  Head: Normocephalic and atraumatic.  Neck: Neck supple. No thyromegaly present.  Cardiovascular: Normal rate, regular rhythm and normal heart sounds.  Pulmonary/Chest: Effort normal and breath sounds normal.  Lymphadenopathy:    She has no cervical adenopathy.  Neurological: She is alert and oriented to person, place, and time.  Skin: Skin is warm and dry.  He does have large circular bruises on her forearms bilaterally.  The one on the left forearm has some hardening  Psychiatric: She has a normal mood and affect. Her behavior is normal.       Assessment & Plan:  Migraine headaches-new prescription was sent to [facility] home delivery.  She has not called to set up an account yet so encouraged her to do so.  She had only really tried one shot.  Dehydration-she is been pushing her fluids and doing a lot better with this.  Easy bruising-unclear etiology.  She did have a normal liver enzyme as well as platelet count.  We can also check a PT/INR.  Did encourage her to massage the lesion on the left side.  Microscopic hematuria with proteinuria-repeat urinalysis today is negative for any blood or protein levels.

## 2018-08-17 ENCOUNTER — Telehealth: Payer: Self-pay | Admitting: Physician Assistant

## 2018-08-17 NOTE — Telephone Encounter (Signed)
Patient is requesting an update be made to her FMLA paperwork for her employer. Her current FMLA paperwork states that her migraines occur 2x per month and last 2-3 days each episode. The update needs to state that her migraines occur 5-6x per month and last 2-3 days per episode. She stated that her employer is fine with printing off the current FMLA paperwork, crossing through the old frequency, updating the new frequency, and initialing the changes. I have printed her previous FMLA paperwork and placed it in your box for review. Please contact patient when complete or if additional information is needed.  Also, pt wanted to let you know that she is no longer getting her Aimovig. Her insurance has stopped using [facility].

## 2018-08-18 NOTE — Telephone Encounter (Signed)
Updated FMLA. We can fax over and leave a copy for you as well.   I am sure your increase in migraines is due to not being on aimovig. If you have the access care(if you don't come and get one) you can go to any pharmacy and pay 5 dollars or if insurance doesn't cover 0 dollars. Lets get you back on this. Ok to send new prescription to pharmacy of her choice.

## 2018-08-22 NOTE — Telephone Encounter (Signed)
Left Vm advising Pt of status update.

## 2018-08-25 ENCOUNTER — Encounter: Payer: Self-pay | Admitting: Emergency Medicine

## 2018-08-25 ENCOUNTER — Emergency Department (INDEPENDENT_AMBULATORY_CARE_PROVIDER_SITE_OTHER)
Admission: EM | Admit: 2018-08-25 | Discharge: 2018-08-25 | Disposition: A | Discharge status: Home / Self Care | Payer: Federal, State, Local not specified - PPO | Source: Home / Self Care

## 2018-08-25 ENCOUNTER — Other Ambulatory Visit: Payer: Self-pay | Admitting: Sports Medicine

## 2018-08-25 DIAGNOSIS — M545 Low back pain, unspecified: Secondary | ICD-10-CM

## 2018-08-25 DIAGNOSIS — G43909 Migraine, unspecified, not intractable, without status migrainosus: Secondary | ICD-10-CM | POA: Diagnosis not present

## 2018-08-25 DIAGNOSIS — G8929 Other chronic pain: Secondary | ICD-10-CM

## 2018-08-25 MED ORDER — KETOROLAC TROMETHAMINE 60 MG/2ML IM SOLN
60.0000 mg | Freq: Once | INTRAMUSCULAR | Status: AC
  Administered 2018-08-25: 60 mg via INTRAMUSCULAR

## 2018-08-25 MED ORDER — PROMETHAZINE HCL 25 MG/ML IJ SOLN
25.0000 mg | Freq: Once | INTRAMUSCULAR | Status: AC
  Administered 2018-08-25: 25 mg via INTRAMUSCULAR

## 2018-08-25 NOTE — ED Triage Notes (Signed)
Pt c/o diarrhea x2 weeks. Happens after every meal. Also c/o HA and vomiting x2 days.

## 2018-08-25 NOTE — Discharge Instructions (Signed)
Continue current medications.   See your Provider for recheck.

## 2018-08-27 NOTE — ED Provider Notes (Signed)
[facility]    CSN: [id number] Arrival date & time: 08/25/18  1417     History   Chief Complaint Chief Complaint  Patient presents with  . Diarrhea    HPI Angela Oliver is a 52 y.o. female.   The history is provided by the patient. No language interpreter was used.  Diarrhea  Quality:  Unable to specify Severity:  Moderate Onset quality:  Gradual Number of episodes:  Multiple  Duration:  2 weeks Timing:  Intermittent Relieved by:  Nothing Worsened by:  Nothing Ineffective treatments:  None tried Risk factors: no recent antibiotic use   Pt also reports having a headache.  Pt reports no relief with home medications   Past Medical History:  Diagnosis Date  . Anxiety   . Arthritis   . Asthma   . Complication of anesthesia    slow to awaken  . Family history of adverse reaction to anesthesia    mother slow to awaken  . GERD (gastroesophageal reflux disease)   . Herpes simplex   . Irregular heart beat    in the past- no palpations- "skipped a beat"  . Migraines   . Seasonal allergies     Patient Active Problem List   Diagnosis Date Noted  . Dysfunction of left eustachian tube 02/01/2018  . Carpal tunnel syndrome on both sides 11/29/2017  . Pain of right breast 07/07/2017  . Family history of breast cancer in mother 07/07/2017  . Abnormal weight gain 07/07/2017  . Obesity (BMI 30.0-34.9) 01/27/2017  . C7 radiculopathy 12/01/2015  . 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 Low back pain 02/19/2014  . Trochanteric bursitis of right hip 11/22/2013  . 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/2012  . Trochanteric bursitis of both hips 11/03/2012  . 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 Herpes simplex 10/23/2012  . Allergic rhinitis 06/09/2011  . Asthma 06/09/2011  . GERD 06/09/2011    Past Surgical History:  Procedure Laterality Date  . APPENDECTOMY    . CHOLECYSTECTOMY  06/01/2016   laproscopic   .  CHOLECYSTECTOMY N/A 06/01/2016   Procedure: LAPAROSCOPIC CHOLECYSTECTOMY WITH INTRAOPERATIVE CHOLANGIOGRAM, REMOVAL PERITONEAL NODULE ADJACENT TO SIGMOID COLON;  Surgeon: Avel Peace, MD;  Location: [facility] OR;  Service: General;  Laterality: N/A;  . COLONOSCOPY     age 19  . OVARIAN CYST REMOVAL Left    part of ovary- cyst hadf ruptured    OB History   None      Home Medications    Prior to Admission medications   Medication Sig Start Date End Date Taking? Authorizing Provider  Albuterol Sulfate (PROAIR RESPICLICK) 108 (90 Base) MCG/ACT AEPB Inhale 2 puffs into the lungs every 4 (four) hours as needed (shortness of breath/ wheezing). 01/23/18   Carlis Stable, PA-C  beclomethasone (QVAR) 80 MCG/ACT inhaler Inhale 2 puffs into the lungs 2 (two) times daily. 07/12/18   Breeback, Jade L, PA-C  celecoxib (CELEBREX) 200 MG capsule TAKE 1 TO 2 TABLETS BY MOUTH DAILY AS NEEDED FOR PAIN. 07/12/18   Breeback, Jade L, PA-C  celecoxib (CELEBREX) 200 MG capsule TAKE 1 TO 2 TABLETS BY MOUTH DAILY AS NEEDED FOR PAIN. 08/25/18   Monica Becton, MD  cetirizine (ZYRTEC) 10 MG tablet Take 1 tablet (10 mg total) by mouth daily. 07/12/18   Breeback, Jade L, PA-C  diclofenac sodium (VOLTAREN) 1 % GEL Apply 4 g topically 4 (four) times daily. To affected joint. 07/21/17   Rodolph Bong,  MD  DULoxetine (CYMBALTA) 30 MG capsule TAKE 1 CAPSULE BY MOUTH EVERY DAY 07/12/18   Breeback, Jade L, PA-C  Erenumab-aooe (AIMOVIG) 140 MG/ML SOAJ Inject 1 pen into the skin every 30 (thirty) days. 07/12/18   Breeback, Jade L, PA-C  meloxicam (MOBIC) 15 MG tablet TAKE 1 TABLET BY MOUTH DAILY AS NEEDED FOR PAIN (FOR JOINT PAIN). DUE FOR FOLLOW UP WITH SPORTS MED 06/16/18   Breeback, Jade L, PA-C  montelukast (SINGULAIR) 10 MG tablet TAKE 1 TABLET (10 MG TOTAL) BY MOUTH DAILY. 07/12/18   Breeback, Jade L, PA-C  omeprazole (PRILOSEC) 40 MG capsule TAKE 1 CAPSULE BY MOUTH EVERY DAY 07/12/18   Breeback, Jade L, PA-C    ondansetron (ZOFRAN) 8 MG tablet Take 1 tablet (8 mg total) by mouth every 8 (eight) hours as needed for nausea or vomiting. 03/15/18   Breeback, Lonna CobbJade L, PA-C  propranolol (INDERAL) 40 MG tablet Take 1 tablet (40 mg total) by mouth 2 (two) times daily. 07/12/18   Jomarie LongsBreeback, Jade L, PA-C    Family History Family History  Problem Relation Age of Onset  . Cancer Mother        breast cancer  . Alcohol abuse Father   . Diabetes Father   . Hyperlipidemia Father   . Asthma Other     Social History Social History   Tobacco Use  . Smoking status: Never Smoker  . Smokeless tobacco: Never Used  Substance Use Topics  . Alcohol use: No  . Drug use: No     Allergies   Sulfa antibiotics and Topamax [topiramate]   Review of Systems Review of Systems  Gastrointestinal: Positive for diarrhea.  All other systems reviewed and are negative.    Physical Exam Triage Vital Signs ED Triage Vitals [08/25/18 1438]  Enc Vitals Group     BP 139/89     Pulse Rate 68     Resp      Temp 98.2 F (36.8 C)     Temp Source Oral     SpO2 99 %     Weight 196 lb (88.9 kg)     Height      Head Circumference      Peak Flow      Pain Score 0     Pain Loc      Pain Edu?      Excl. in GC?    No data found.  Updated Vital Signs BP 139/89 (BP Location: Right Arm)   Pulse 68   Temp 98.2 F (36.8 C) (Oral)   Wt 196 lb (88.9 kg)   LMP 04/15/2017 (Exact Date)   SpO2 99%   BMI 33.64 kg/m   Visual Acuity Right Eye Distance:   Left Eye Distance:   Bilateral Distance:    Right Eye Near:   Left Eye Near:    Bilateral Near:     Physical Exam  Constitutional: She is oriented to person, place, and time. She appears well-developed and well-nourished.  HENT:  Head: Normocephalic.  Eyes: EOM are normal.  Neck: Normal range of motion.  Pulmonary/Chest: Effort normal.  Abdominal: She exhibits no distension.  Musculoskeletal: Normal range of motion.  Neurological: She is alert and oriented to  person, place, and time.  Psychiatric: She has a normal mood and affect.  Nursing note and vitals reviewed.    UC Treatments / Results  Labs (all labs ordered are listed, but only abnormal results are displayed) Labs Reviewed - No data to display  EKG None  Radiology No results found.  Procedures Procedures (including critical care time)  Medications Ordered in UC Medications  ketorolac (TORADOL) injection 60 mg (60 mg Intramuscular Given 08/25/18 1522)  promethazine (PHENERGAN) injection 25 mg (25 mg Intramuscular Given 08/25/18 1524)    Initial Impression / Assessment and Plan / UC Course  I have reviewed the triage vital signs and the nursing notes.  Pertinent labs & imaging results that were available during my care of the patient were reviewed by me and considered in my medical decision making (see chart for details).     MDM   Pt reports relief with medications.   Pt advised to try imodium for diarrhea.  I suspect symptoms from irritable bowel  Final Clinical Impressions(s) / UC Diagnoses   Final diagnoses:  Migraine without status migrainosus, not intractable, unspecified migraine type     Discharge Instructions     Continue current medications.   See your Provider for recheck.     ED Prescriptions    None     Controlled Substance Prescriptions [place] Controlled Substance Registry consulted? Not Applicable   An After Visit Summary was printed and given to the patient.    Elson Areas, [place] 08/27/18 0110

## 2018-10-25 ENCOUNTER — Other Ambulatory Visit: Payer: Self-pay

## 2018-10-25 ENCOUNTER — Telehealth: Payer: Self-pay | Admitting: Physician Assistant

## 2018-10-25 DIAGNOSIS — G43711 Chronic migraine without aura, intractable, with status migrainosus: Secondary | ICD-10-CM

## 2018-10-25 MED ORDER — ERENUMAB-AOOE 140 MG/ML ~~LOC~~ SOAJ: 1.0000 "pen " | SUBCUTANEOUS | 0 refills | Status: DC

## 2018-10-25 NOTE — Telephone Encounter (Signed)
Ok to refill and to follow up in next 3 months.

## 2018-10-25 NOTE — Telephone Encounter (Signed)
Patient called stating that she has left two voicemail's for casey to call her back about aimovig for her migraines. States that she doesn't know what happened and wanted to know if it was up in limbo. Please contact and advise.

## 2018-10-26 ENCOUNTER — Encounter: Payer: Self-pay | Admitting: Family Medicine

## 2018-10-26 ENCOUNTER — Ambulatory Visit (INDEPENDENT_AMBULATORY_CARE_PROVIDER_SITE_OTHER): Payer: Federal, State, Local not specified - PPO | Admitting: Family Medicine

## 2018-10-26 VITALS — BP 139/74 | HR 75 | Ht 64.0 in | Wt 203.0 lb

## 2018-10-26 DIAGNOSIS — Z1239 Encounter for other screening for malignant neoplasm of breast: Secondary | ICD-10-CM | POA: Diagnosis not present

## 2018-10-26 DIAGNOSIS — G43911 Migraine, unspecified, intractable, with status migrainosus: Secondary | ICD-10-CM | POA: Diagnosis not present

## 2018-10-26 DIAGNOSIS — Z23 Encounter for immunization: Secondary | ICD-10-CM | POA: Diagnosis not present

## 2018-10-26 MED ORDER — PROMETHAZINE HCL 25 MG/ML IJ SOLN
25.0000 mg | Freq: Once | INTRAMUSCULAR | Status: AC
  Administered 2018-10-26: 25 mg via INTRAMUSCULAR

## 2018-10-26 MED ORDER — METHYLPREDNISOLONE ACETATE 80 MG/ML IJ SUSP
80.0000 mg | Freq: Once | INTRAMUSCULAR | Status: AC
  Administered 2018-10-26: 80 mg via INTRAMUSCULAR

## 2018-10-26 MED ORDER — KETOROLAC TROMETHAMINE 60 MG/2ML IM SOLN
60.0000 mg | Freq: Once | INTRAMUSCULAR | Status: AC
  Administered 2018-10-26: 60 mg via INTRAMUSCULAR

## 2018-10-26 NOTE — Telephone Encounter (Signed)
Patients medication was reordered and sent to pharmacy yesterday. Patient has been called and notified of medication being sent to pharmacy as well. No further questions or concerns at this time.

## 2018-10-26 NOTE — Patient Instructions (Signed)
Thank you for coming in today. Bjorn LoserRhonda will give the migraine cocktail.  Go home and rest.  Let me know if not better.  [facility] or me via Mychart so she can have a record of Aimovig improving headaches.    Migraine Headache A migraine headache is an intense, throbbing pain on one side or both sides of the head. Migraines may also cause other symptoms, such as nausea, vomiting, and sensitivity to light and noise. What are the causes? Doing or taking certain things may also trigger migraines, such as:  Alcohol.  Smoking.  Medicines, such as: ? Medicine used to treat chest pain (nitroglycerine). ? Birth control pills. ? Estrogen pills. ? Certain blood pressure medicines.  Aged cheeses, chocolate, or caffeine.  Foods or drinks that contain nitrates, glutamate, aspartame, or tyramine.  Physical activity.  Other things that may trigger a migraine include:  Menstruation.  Pregnancy.  Hunger.  Stress, lack of sleep, too much sleep, or fatigue.  Weather changes.  What increases the risk? The following factors may make you more likely to experience migraine headaches:  Age. Risk increases with age.  Family history of migraine headaches.  Being Caucasian.  Depression and anxiety.  Obesity.  Being a woman.  Having a hole in the heart (patent foramen ovale) or other heart problems.  What are the signs or symptoms? The main symptom of this condition is pulsating or throbbing pain. Pain may:  Happen in any area of the head, such as on one side or both sides.  Interfere with daily activities.  Get worse with physical activity.  Get worse with exposure to bright lights or loud noises.  Other symptoms may include:  Nausea.  Vomiting.  Dizziness.  General sensitivity to bright lights, loud noises, or smells.  Before you get a migraine, you may get warning signs that a migraine is developing (aura). An aura may include:  Seeing flashing  lights or having blind spots.  Seeing bright spots, halos, or zigzag lines.  Having tunnel vision or blurred vision.  Having numbness or a tingling feeling.  Having trouble talking.  Having muscle weakness.  How is this diagnosed? A migraine headache can be diagnosed based on:  Your symptoms.  A physical exam.  Tests, such as CT scan or MRI of the head. These imaging tests can help rule out other causes of headaches.  Taking fluid from the spine (lumbar puncture) and analyzing it (cerebrospinal fluid analysis, or CSF analysis).  How is this treated? A migraine headache is usually treated with medicines that:  Relieve pain.  Relieve nausea.  Prevent migraines from coming back.  Treatment may also include:  Acupuncture.  Lifestyle changes like avoiding foods that trigger migraines.  Follow these instructions at home: Medicines  Take over-the-counter and prescription medicines only as told by your health care provider.  Do not drive or use heavy machinery while taking prescription pain medicine.  To prevent or treat constipation while you are taking prescription pain medicine, your health care provider may recommend that you: ? Drink enough fluid to keep your urine clear or pale yellow. ? Take over-the-counter or prescription medicines. ? Eat foods that are high in fiber, such as fresh fruits and vegetables, whole grains, and beans. ? Limit foods that are high in fat and processed sugars, such as fried and sweet foods. Lifestyle  Avoid alcohol use.  Do not use any products that contain nicotine or tobacco, such as cigarettes and e-cigarettes. If you  need help quitting, ask your health care provider.  Get at least 8 hours of sleep every night.  Limit your stress. General instructions   Keep a journal to find out what may trigger your migraine headaches. For example, write down: ? What you eat and drink. ? How much sleep you get. ? Any change to your diet  or medicines.  If you have a migraine: ? Avoid things that make your symptoms worse, such as bright lights. ? It may help to lie down in a dark, quiet room. ? Do not drive or use heavy machinery. ? Ask your health care provider what activities are safe for you while you are experiencing symptoms.  Keep all follow-up visits as told by your health care provider. This is important. Contact a health care provider if:  You develop symptoms that are different or more severe than your usual migraine symptoms. Get help right away if:  Your migraine becomes severe.  You have a fever.  You have a stiff neck.  You have vision loss.  Your muscles feel weak or like you cannot control them.  You start to lose your balance often.  You develop trouble walking.  You faint. This information is not intended to replace advice given to you by your health care provider. Make sure you discuss any questions you have with your health care provider. Document Released: 11/29/2005 Document Revised: 06/18/2016 Document Reviewed: 05/17/2016 Elsevier Interactive Patient Education  2017 [employer].

## 2018-10-27 ENCOUNTER — Encounter: Payer: Self-pay | Admitting: Family Medicine

## 2018-10-27 ENCOUNTER — Telehealth: Payer: Self-pay

## 2018-10-27 NOTE — Telephone Encounter (Signed)
Patient called requesting a noted for missing work with the following dates including  November 12-November 15th. Please advise once letter is done patient stated that she will print it from her chart and send in on Monday. Aarish Rockers,CMA

## 2018-10-27 NOTE — Progress Notes (Signed)
Angela Oliver is a 52 y.o. female who [facility] today for headache.  Angela Oliver notes a 5-day history of a migraine headache.  She has photophobia and phonophobia.  Symptoms are consistent with prior episodes of migraine.  She is tried ibuprofen which helps a little.  She notes fatigue but denies vomiting or new numbness or loss of function.  She is well otherwise.  In the past she is done well with migraine cocktail injections.  Additionally she just recently started Aimovig for her chronic migraines.  She had a trial of that earlier this year and did extremely well and is excited and hopeful that she will have fewer migraines in the future.   ROS as above:  Exam:  BP 139/74   Pulse 75   Ht 5\' 4"  (1.626 m)   Wt 203 lb (92.1 kg)   LMP 04/15/2017 (Exact Date)   BMI 34.84 kg/m  Wt Readings from Last 5 Encounters:  10/26/18 203 lb (92.1 kg)  08/25/18 196 lb (88.9 kg)  07/31/18 193 lb (87.5 kg)  07/20/18 192 lb (87.1 kg)  07/12/18 198 lb (89.8 kg)    Gen: Well NAD HEENT: EOMI,  MMM PERRLA Lungs: Normal work of breathing. CTABL Heart: RRR no MRG Abd: NABS, Soft. Nondistended, Nontender Exts: Brisk capillary refill, warm and well perfused.  Neuro alert and oriented normal coordination balance and gait  Patient was given migraine cocktail including Toradol 60, Depo-Medrol 80, Phenergan 25 IM     Assessment and Plan: 52 y.o. female with  Migraine headaches test with cocktail as above.  Additionally continue Aimovig for chronic migraines.  Follow-up with PCP in the future.  Flu vaccine given.  Mammogram ordered.   Orders Placed This Encounter  Procedures  . MM 3D SCREEN BREAST BILATERAL    Standing Status:   Future    Standing Expiration Date:   12/27/2019    Order Specific Question:   Reason for Exam (SYMPTOM  OR DIAGNOSIS REQUIRED)    Answer:    screen breast cancer    Order Specific Question:   Is the patient pregnant?    Answer:   No    Order Specific Question:   Preferred imaging location?    [facility]  . Flu Vaccine QUAD 6+ mos PF IM (Fluarix Quad PF)   Meds ordered this encounter  Medications  . promethazine (PHENERGAN) injection 25 mg  . methylPREDNISolone acetate (DEPO-MEDROL) injection 80 mg  . ketorolac (TORADOL) injection 60 mg     Historical information moved to improve visibility of documentation.  Past Medical History:  Diagnosis Date  . Anxiety   . Arthritis   . Asthma   . Complication of anesthesia    slow to awaken  . Family history of adverse reaction to anesthesia    mother slow to awaken  . GERD (gastroesophageal reflux disease)   . Herpes simplex   . Irregular heart beat    in the past- no palpations- "skipped a beat"  . Migraines   . Seasonal allergies    Past Surgical History:  Procedure Laterality Date  . APPENDECTOMY    . CHOLECYSTECTOMY  06/01/2016   laproscopic   . CHOLECYSTECTOMY N/A 06/01/2016   Procedure: LAPAROSCOPIC CHOLECYSTECTOMY WITH INTRAOPERATIVE CHOLANGIOGRAM, REMOVAL PERITONEAL NODULE ADJACENT TO SIGMOID COLON;  Surgeon: Avel Peaceodd Rosenbower, MD;  [facility];  Service: General;  Laterality: N/A;  . COLONOSCOPY  age 33  . OVARIAN CYST REMOVAL Left    part of ovary- cyst hadf ruptured   Social History   Tobacco Use  . Smoking status: Never Smoker  . Smokeless tobacco: Never Used  Substance Use Topics  . Alcohol use: No   family history includes Alcohol abuse in her father; Asthma in her other; Cancer in her mother; Diabetes in her father; Hyperlipidemia in her father.  Medications: Current Outpatient Medications  Medication Sig Dispense Refill  . Albuterol Sulfate (PROAIR RESPICLICK) 108 (90 Base) MCG/ACT AEPB Inhale 2 puffs into the lungs every 4 (four) hours as needed (shortness of breath/ wheezing). 1 each 1  . beclomethasone (QVAR) 80  MCG/ACT inhaler Inhale 2 puffs into the lungs 2 (two) times daily. 3 Inhaler 3  . celecoxib (CELEBREX) 200 MG capsule TAKE 1 TO 2 TABLETS BY MOUTH DAILY AS NEEDED FOR PAIN. 180 capsule 3  . cetirizine (ZYRTEC) 10 MG tablet Take 1 tablet (10 mg total) by mouth daily. 90 tablet 3  . diclofenac sodium (VOLTAREN) 1 % GEL Apply 4 g topically 4 (four) times daily. To affected joint. 100 g 11  . DULoxetine (CYMBALTA) 30 MG capsule TAKE 1 CAPSULE BY MOUTH EVERY DAY 90 capsule 3  . Erenumab-aooe (AIMOVIG) 140 MG/ML SOAJ Inject 1 pen into the skin every 30 (thirty) days. 3 pen 0  . meloxicam (MOBIC) 15 MG tablet TAKE 1 TABLET BY MOUTH DAILY AS NEEDED FOR PAIN (FOR JOINT PAIN). DUE FOR FOLLOW UP WITH SPORTS MED 15 tablet 0  . montelukast (SINGULAIR) 10 MG tablet TAKE 1 TABLET (10 MG TOTAL) BY MOUTH DAILY. 90 tablet 3  . omeprazole (PRILOSEC) 40 MG capsule TAKE 1 CAPSULE BY MOUTH EVERY DAY 90 capsule 3  . ondansetron (ZOFRAN) 8 MG tablet Take 1 tablet (8 mg total) by mouth every 8 (eight) hours as needed for nausea or vomiting. 20 tablet 2  . propranolol (INDERAL) 40 MG tablet Take 1 tablet (40 mg total) by mouth 2 (two) times daily. 180 tablet 3   No current facility-administered medications for this visit.    Allergies  Allergen Reactions  . Sulfa Antibiotics Hives, Shortness Of Breath, Swelling and Rash    Throat swelling  . Topamax [Topiramate] Other (See Comments)    Severe stomach pain     Discussed warning signs or symptoms. Please see discharge instructions. Patient expresses understanding.

## 2018-10-28 ENCOUNTER — Encounter (HOSPITAL_BASED_OUTPATIENT_CLINIC_OR_DEPARTMENT_OTHER): Payer: Self-pay | Admitting: Emergency Medicine

## 2018-10-28 ENCOUNTER — Emergency Department (HOSPITAL_BASED_OUTPATIENT_CLINIC_OR_DEPARTMENT_OTHER): Payer: Federal, State, Local not specified - PPO

## 2018-10-28 ENCOUNTER — Other Ambulatory Visit: Payer: Self-pay

## 2018-10-28 ENCOUNTER — Emergency Department (HOSPITAL_BASED_OUTPATIENT_CLINIC_OR_DEPARTMENT_OTHER)
Admission: EM | Admit: 2018-10-28 | Discharge: 2018-10-28 | Disposition: A | Discharge status: Home / Self Care | Payer: Federal, State, Local not specified - PPO | Attending: Emergency Medicine | Admitting: Emergency Medicine

## 2018-10-28 DIAGNOSIS — N202 Calculus of kidney with calculus of ureter: Secondary | ICD-10-CM | POA: Diagnosis not present

## 2018-10-28 DIAGNOSIS — Z79899 Other long term (current) drug therapy: Secondary | ICD-10-CM | POA: Insufficient documentation

## 2018-10-28 DIAGNOSIS — N200 Calculus of kidney: Secondary | ICD-10-CM | POA: Insufficient documentation

## 2018-10-28 DIAGNOSIS — J45909 Unspecified asthma, uncomplicated: Secondary | ICD-10-CM | POA: Diagnosis not present

## 2018-10-28 DIAGNOSIS — R1012 Left upper quadrant pain: Secondary | ICD-10-CM | POA: Diagnosis not present

## 2018-10-28 LAB — CBC WITH DIFFERENTIAL/PLATELET
ABS IMMATURE GRANULOCYTES: 0.05 10*3/uL (ref 0.00–0.07)
BASOS ABS: 0 10*3/uL (ref 0.0–0.1)
BASOS PCT: 0 %
Eosinophils Absolute: 0.4 10*3/uL (ref 0.0–0.5)
Eosinophils Relative: 4 %
HCT: 40.8 % (ref 36.0–46.0)
HEMOGLOBIN: 13.6 g/dL (ref 12.0–15.0)
IMMATURE GRANULOCYTES: 1 %
LYMPHS PCT: 23 %
Lymphs Abs: 2.1 10*3/uL (ref 0.7–4.0)
MCH: 30.3 pg (ref 26.0–34.0)
MCHC: 33.3 g/dL (ref 30.0–36.0)
MCV: 90.9 fL (ref 80.0–100.0)
Monocytes Absolute: 0.9 10*3/uL (ref 0.1–1.0)
Monocytes Relative: 10 %
NEUTROS ABS: 5.5 10*3/uL (ref 1.7–7.7)
NEUTROS PCT: 62 %
NRBC: 0 % (ref 0.0–0.2)
PLATELETS: 246 10*3/uL (ref 150–400)
RBC: 4.49 MIL/uL (ref 3.87–5.11)
RDW: 12.3 % (ref 11.5–15.5)
WBC: 9 10*3/uL (ref 4.0–10.5)

## 2018-10-28 LAB — URINALYSIS, ROUTINE W REFLEX MICROSCOPIC
Bilirubin Urine: NEGATIVE
Glucose, UA: NEGATIVE mg/dL
Ketones, ur: NEGATIVE mg/dL
Leukocytes, UA: NEGATIVE
NITRITE: NEGATIVE
PROTEIN: NEGATIVE mg/dL
pH: 5.5 (ref 5.0–8.0)

## 2018-10-28 LAB — COMPREHENSIVE METABOLIC PANEL
ALBUMIN: 4 g/dL (ref 3.5–5.0)
ALT: 23 U/L (ref 0–44)
AST: 33 U/L (ref 15–41)
Alkaline Phosphatase: 75 U/L (ref 38–126)
Anion gap: 8 (ref 5–15)
BUN: 26 mg/dL — AB (ref 6–20)
CHLORIDE: 107 mmol/L (ref 98–111)
CO2: 24 mmol/L (ref 22–32)
CREATININE: 1.41 mg/dL — AB (ref 0.44–1.00)
Calcium: 9.1 mg/dL (ref 8.9–10.3)
GFR calc Af Amer: 48 mL/min — ABNORMAL LOW (ref >60)
GFR, EST NON AFRICAN AMERICAN: 41 mL/min — AB (ref >60)
GLUCOSE: 118 mg/dL — AB (ref 70–99)
Potassium: 4.2 mmol/L (ref 3.5–5.1)
Sodium: 139 mmol/L (ref 135–145)
Total Bilirubin: 0.7 mg/dL (ref 0.3–1.2)
Total Protein: 7.3 g/dL (ref 6.5–8.1)

## 2018-10-28 LAB — URINALYSIS, MICROSCOPIC (REFLEX)

## 2018-10-28 LAB — LIPASE, BLOOD: LIPASE: 38 U/L (ref 11–51)

## 2018-10-28 MED ORDER — HYDROCODONE-ACETAMINOPHEN 5-325 MG PO TABS: 1.0000 | ORAL_TABLET | Freq: Four times a day (QID) | ORAL | 0 refills | Status: DC | PRN

## 2018-10-28 MED ORDER — KETOROLAC TROMETHAMINE 30 MG/ML IJ SOLN
30.0000 mg | Freq: Once | INTRAMUSCULAR | Status: AC
  Administered 2018-10-28: 30 mg via INTRAVENOUS
  Filled 2018-10-28: qty 1

## 2018-10-28 MED ORDER — ONDANSETRON HCL 4 MG/2ML IJ SOLN
4.0000 mg | Freq: Once | INTRAMUSCULAR | Status: AC
  Administered 2018-10-28: 4 mg via INTRAVENOUS
  Filled 2018-10-28: qty 2

## 2018-10-28 NOTE — ED Provider Notes (Signed)
[facility] EMERGENCY DEPARTMENT Provider Note   CSN: [id number] Arrival date & time: 10/28/18  0145     History   Chief Complaint Chief Complaint  Patient presents with  . Abdominal Pain    HPI Angela Oliver is a 52 y.o. female.  Patient is a 52 year old female with history of prior cholecystectomy, asthma, and migraines.  She presents today for evaluation of left-sided abdominal pain.  This started yesterday and is worsening.  She describes pain in the left upper quadrant and left flank with associated nausea but no vomiting.  She denies any fevers or chills.  She denies any bowel or bladder complaints.  The history is provided by the patient.  Abdominal Pain   This is a new problem. The current episode started yesterday. The problem occurs constantly. The problem has been rapidly worsening. The pain is associated with an unknown factor. The pain is located in the LUQ. The quality of the pain is cramping. The pain is moderate. Pertinent negatives include fever, hematochezia, melena, constipation and dysuria. Nothing aggravates the symptoms. Nothing relieves the symptoms.    Past Medical History:  Diagnosis Date  . Anxiety   . Arthritis   . Asthma   . Complication of anesthesia    slow to awaken  . Family history of adverse reaction to anesthesia    mother slow to awaken  . GERD (gastroesophageal reflux disease)   . Herpes simplex   . Irregular heart beat    in the past- no palpations- "skipped a beat"  . Migraines   . Seasonal allergies     Patient Active Problem List   Diagnosis Date Noted  . Dysfunction of left eustachian tube 02/01/2018  . Carpal tunnel syndrome on both sides 11/29/2017  . Pain of right breast 07/07/2017  . Family history of breast cancer in mother 07/07/2017  . Abnormal weight gain 07/07/2017  . Obesity (BMI 30.0-34.9) 01/27/2017  . C7 radiculopathy 12/01/2015  . Intractable chronic migraine without aura and with status migrainosus  10/02/2014  . Low back pain 02/19/2014  . Trochanteric bursitis of right hip 11/22/2013  . 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/2012  . Trochanteric bursitis of both hips 11/03/2012  . 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 Herpes simplex 10/23/2012  . Allergic rhinitis 06/09/2011  . Asthma 06/09/2011  . GERD 06/09/2011    Past Surgical History:  Procedure Laterality Date  . APPENDECTOMY    . CHOLECYSTECTOMY  06/01/2016   laproscopic   . CHOLECYSTECTOMY N/A 06/01/2016   Procedure: LAPAROSCOPIC CHOLECYSTECTOMY WITH INTRAOPERATIVE CHOLANGIOGRAM, REMOVAL PERITONEAL NODULE ADJACENT TO SIGMOID COLON;  Surgeon: Avel Peaceodd Rosenbower, MD;  [facility];  Service: General;  Laterality: N/A;  . COLONOSCOPY     age 52  . OVARIAN CYST REMOVAL Left    part of ovary- cyst hadf ruptured     OB History   None      Home Medications    Prior to Admission medications   Medication Sig Start Date End Date Taking? Authorizing Provider  Albuterol Sulfate (PROAIR RESPICLICK) 108 (90 Base) MCG/ACT AEPB Inhale 2 puffs into the lungs every 4 (four) hours as needed (shortness of breath/ wheezing). 01/23/18   Carlis Stableummings, Charley Elizabeth, PA-C  beclomethasone (QVAR) 80 MCG/ACT inhaler Inhale 2 puffs into the lungs 2 (two) times daily. 07/12/18   Breeback, Jade L, PA-C  celecoxib (CELEBREX) 200 MG capsule TAKE 1 TO 2 TABLETS BY MOUTH DAILY AS NEEDED FOR PAIN. 07/12/18   Tandy GawBreeback, Jade L, PA-C  cetirizine (  ZYRTEC) 10 MG tablet Take 1 tablet (10 mg total) by mouth daily. 07/12/18   Breeback, Jade L, PA-C  diclofenac sodium (VOLTAREN) 1 % GEL Apply 4 g topically 4 (four) times daily. To affected joint. 07/21/17   Rodolph Bong, MD  DULoxetine (CYMBALTA) 30 MG capsule TAKE 1 CAPSULE BY MOUTH EVERY DAY 07/12/18   Breeback, Jade L, PA-C  Erenumab-aooe (AIMOVIG) 140 MG/ML SOAJ Inject 1 pen into the skin every 30 (thirty) days. 10/25/18   Breeback, Jade L, PA-C  meloxicam (MOBIC) 15 MG tablet TAKE 1 TABLET BY MOUTH DAILY AS  NEEDED FOR PAIN (FOR JOINT PAIN). DUE FOR FOLLOW UP WITH SPORTS MED 06/16/18   Breeback, Jade L, PA-C  montelukast (SINGULAIR) 10 MG tablet TAKE 1 TABLET (10 MG TOTAL) BY MOUTH DAILY. 07/12/18   Breeback, Jade L, PA-C  omeprazole (PRILOSEC) 40 MG capsule TAKE 1 CAPSULE BY MOUTH EVERY DAY 07/12/18   Breeback, Jade L, PA-C  ondansetron (ZOFRAN) 8 MG tablet Take 1 tablet (8 mg total) by mouth every 8 (eight) hours as needed for nausea or vomiting. 03/15/18   Breeback, Lonna Cobb, PA-C  propranolol (INDERAL) 40 MG tablet Take 1 tablet (40 mg total) by mouth 2 (two) times daily. 07/12/18   Jomarie Longs, PA-C    Family History Family History  Problem Relation Age of Onset  . Cancer Mother        breast cancer  . Alcohol abuse Father   . Diabetes Father   . Hyperlipidemia Father   . Asthma Other     Social History Social History   Tobacco Use  . Smoking status: Never Smoker  . Smokeless tobacco: Never Used  Substance Use Topics  . Alcohol use: No  . Drug use: No     Allergies   Sulfa antibiotics and Topamax [topiramate]   Review of Systems Review of Systems  Constitutional: Negative for fever.  Gastrointestinal: Positive for abdominal pain. Negative for constipation, hematochezia and melena.  Genitourinary: Negative for dysuria.  All other systems reviewed and are negative.    Physical Exam Updated Vital Signs BP (!) 141/97 (BP Location: Left Arm)   Pulse 70   Temp 98.3 F (36.8 C) (Oral)   Resp 20   Ht 5\' 4"  (1.626 m)   Wt 93.5 kg   LMP 04/15/2017 (Exact Date)   SpO2 100%   BMI 35.38 kg/m   Physical Exam  Constitutional: She is oriented to person, place, and time. She appears well-developed and well-nourished. No distress.  HENT:  Head: Normocephalic and atraumatic.  Neck: Normal range of motion. Neck supple.  Cardiovascular: Normal rate and regular rhythm. Exam reveals no gallop and no friction rub.  No murmur heard. Pulmonary/Chest: Effort normal and breath sounds  normal. No respiratory distress. She has no wheezes.  Abdominal: Soft. Bowel sounds are normal. She exhibits no distension. There is tenderness in the left upper quadrant. There is no rigidity, no rebound and no guarding.  There is tenderness to palpation in the left upper quadrant and left flank.  Musculoskeletal: Normal range of motion.  Neurological: She is alert and oriented to person, place, and time.  Skin: Skin is warm and dry. She is not diaphoretic.  Nursing note and vitals reviewed.    ED Treatments / Results  Labs (all labs ordered are listed, but only abnormal results are displayed) Labs Reviewed  COMPREHENSIVE METABOLIC PANEL  CBC WITH DIFFERENTIAL/PLATELET  URINALYSIS, ROUTINE W REFLEX MICROSCOPIC  LIPASE, BLOOD  EKG None  Radiology No results found.  Procedures Procedures (including critical care time)  Medications Ordered in ED Medications  ondansetron (ZOFRAN) injection 4 mg (has no administration in time range)  ketorolac (TORADOL) 30 MG/ML injection 30 mg (has no administration in time range)     Initial Impression / Assessment and Plan / ED Course  I have reviewed the triage vital signs and the nursing notes.  Pertinent labs & imaging results that were available during my care of the patient were reviewed by me and considered in my medical decision making (see chart for details).  CT scan shows a 3 mm stone in the left proximal ureter.  She is feeling better after Toradol.  Urine is not infected, she is afebrile, and has no white count.  At this point feel as though she is appropriate for discharge with pain medication and follow-up with urology.  Final Clinical Impressions(s) / ED Diagnoses   Final diagnoses:  None    ED Discharge Orders    None       Geoffery Lyons, MD 10/28/18 [phone]

## 2018-10-28 NOTE — ED Triage Notes (Signed)
Pt c/o LUQ abd pain that started yesterday. Pt reports diarrhea yesterday. Pt also c/o migraine x 3 days. Seen by PMD yesterday and given "migraine cocktail."

## 2018-10-28 NOTE — Discharge Instructions (Addendum)
Hydrocodone is prescribed as needed for pain.  Make a follow-up appointment with urology if your symptoms are not improving in the next 3 to 4 days.  [facility] has been provided in this discharge summary for you to call and make these arrangements.  Return to the emergency department if you develop worsening pain, high fevers, or other new and concerning symptoms.

## 2018-10-30 NOTE — Telephone Encounter (Signed)
Letter has been done. Merla Sawka,CMA

## 2018-11-15 ENCOUNTER — Encounter: Payer: Self-pay | Admitting: Physician Assistant

## 2018-11-15 ENCOUNTER — Ambulatory Visit (INDEPENDENT_AMBULATORY_CARE_PROVIDER_SITE_OTHER): Payer: Federal, State, Local not specified - PPO | Admitting: Physician Assistant

## 2018-11-15 VITALS — BP 136/60 | HR 84 | Temp 99.2°F | Ht 64.0 in | Wt 202.0 lb

## 2018-11-15 DIAGNOSIS — R05 Cough: Secondary | ICD-10-CM | POA: Diagnosis not present

## 2018-11-15 DIAGNOSIS — R059 Cough, unspecified: Secondary | ICD-10-CM

## 2018-11-15 DIAGNOSIS — J0140 Acute pansinusitis, unspecified: Secondary | ICD-10-CM | POA: Diagnosis not present

## 2018-11-15 MED ORDER — PROMETHAZINE-DM 6.25-15 MG/5ML PO SYRP: 5.0000 mL | ORAL_SOLUTION | Freq: Four times a day (QID) | ORAL | 0 refills | Status: DC | PRN

## 2018-11-15 MED ORDER — AMOXICILLIN-POT CLAVULANATE 875-125 MG PO TABS: 1.0000 | ORAL_TABLET | Freq: Two times a day (BID) | ORAL | 0 refills | Status: DC

## 2018-11-15 NOTE — Progress Notes (Signed)
Subjective:    Patient ID: Angela Oliver, female    DOB: 11/17/1966, 52 y.o.   MRN: [id number]  HPI Patient is a 52 year old female who presents to the clinic with 10 days of sinus pressure, headache, coughing, sinus drainage. Her boyfriend was sick first and improved after abx. She feels like she has the same thing. She admits to coughing so much at night she can't sleep. No fever, chills, body aches. No improvement with OTC medications. No SOB or wheezing.   .. Active Ambulatory Problems    Diagnosis Date Noted  . Allergic rhinitis 06/09/2011  . Asthma 06/09/2011  . GERD 06/09/2011  . 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 Herpes simplex 10/23/2012  . 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/2012  . Trochanteric bursitis of both hips 11/03/2012  . Trochanteric bursitis of right hip 11/22/2013  . Low back pain 02/19/2014  . 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 C7 radiculopathy 12/01/2015  . Obesity (BMI 30.0-34.9) 01/27/2017  . Pain of right breast 07/07/2017  . Family history of breast cancer in mother 07/07/2017  . Abnormal weight gain 07/07/2017  . Carpal tunnel syndrome on both sides 11/29/2017  . Dysfunction of left eustachian tube 02/01/2018   Resolved Ambulatory Problems    Diagnosis Date Noted  . RUQ PAIN 06/09/2011  . Migraines 10/23/2012  . Neck muscle spasm 11/03/2012  . Peroneal tendinitis of left lower extremity 12/29/2012  . Left foot pain 11/22/2013  . Foot pain, left 02/19/2014  . Poison ivy dermatitis 07/04/2014  . Acute bronchitis 09/16/2015  . Paresthesia of both hands 12/01/2015  . Calculus of gallbladder without cholecystitis without obstruction 05/03/2016  . Symptomatic cholelithiasis 06/01/2016  . S/P cholecystectomy 07/16/2016  . Low back sprain 07/28/2016   Past Medical History:  Diagnosis Date  . Anxiety   . Arthritis   . Asthma   . Complication of anesthesia   . Family history of adverse reaction to  anesthesia   . GERD (gastroesophageal reflux disease)   . Irregular heart beat   . Seasonal allergies       Review of Systems See HPI.     Objective:   Physical Exam  Constitutional: She is oriented to person, place, and time. She appears well-developed and well-nourished.  HENT:  Head: Normocephalic and atraumatic.  Right Ear: External ear normal.  Left Ear: External ear normal.  Mouth/Throat: No oropharyngeal exudate.  TM's with some bilateral mild effusion.  Tenderness over maxillary and frontal sinuses.  Bilateral nasal turbinates red and swollen.  Oropharynx erythematous with PND.   Eyes: Conjunctivae are normal. Right eye exhibits no discharge. Left eye exhibits no discharge.  Neck: Normal range of motion.  Cardiovascular: Normal rate and regular rhythm.  Pulmonary/Chest: Effort normal.  Lymphadenopathy:    She has cervical adenopathy.  Neurological: She is alert and oriented to person, place, and time.  Psychiatric: She has a normal mood and affect. Her behavior is normal.          Assessment & Plan:  Marland Kitchen.Marland Kitchen.[name] was seen today for cough.  Diagnoses and all orders for this visit:  Acute non-recurrent pansinusitis -     promethazine-dextromethorphan (PROMETHAZINE-DM) 6.25-15 MG/5ML syrup; Take 5 mLs by mouth 4 (four) times daily as needed for cough. -     amoxicillin-clavulanate (AUGMENTIN) 875-125 MG tablet; Take 1 tablet by mouth 2 (two) times daily.  Cough -     promethazine-dextromethorphan (PROMETHAZINE-DM) 6.25-15 MG/5ML syrup; Take 5 mLs by mouth 4 (four)  times daily as needed for cough.   Treat for sinusitis with Augmentin cough syrup given to patient to use as needed.  Handout for symptomatic care given.  Encouraged Flonase and nasal sinus rinses.  Written out of work for today and possibly tomorrow.  Encourage rest and hydration.

## 2018-11-15 NOTE — Patient Instructions (Signed)

## 2018-11-29 ENCOUNTER — Emergency Department: Admission: EM | Admit: 2018-11-29 | Discharge: 2018-11-29 | Discharge status: Absent without leave | Payer: Self-pay

## 2018-11-29 ENCOUNTER — Ambulatory Visit (INDEPENDENT_AMBULATORY_CARE_PROVIDER_SITE_OTHER): Payer: Self-pay

## 2018-11-29 ENCOUNTER — Other Ambulatory Visit: Payer: Self-pay | Admitting: Gerontology

## 2018-11-29 ENCOUNTER — Other Ambulatory Visit (HOSPITAL_BASED_OUTPATIENT_CLINIC_OR_DEPARTMENT_OTHER): Payer: Self-pay | Admitting: Gerontology

## 2018-11-29 ENCOUNTER — Other Ambulatory Visit: Payer: Self-pay

## 2018-11-29 DIAGNOSIS — M545 Low back pain: Secondary | ICD-10-CM

## 2018-11-29 DIAGNOSIS — R52 Pain, unspecified: Secondary | ICD-10-CM

## 2018-11-29 DIAGNOSIS — M546 Pain in thoracic spine: Secondary | ICD-10-CM

## 2018-11-29 DIAGNOSIS — M25551 Pain in right hip: Secondary | ICD-10-CM

## 2018-12-23 ENCOUNTER — Other Ambulatory Visit: Payer: Self-pay | Admitting: Physician Assistant

## 2018-12-23 DIAGNOSIS — M5412 Radiculopathy, cervical region: Secondary | ICD-10-CM

## 2018-12-23 DIAGNOSIS — K219 Gastro-esophageal reflux disease without esophagitis: Secondary | ICD-10-CM

## 2018-12-29 ENCOUNTER — Ambulatory Visit (INDEPENDENT_AMBULATORY_CARE_PROVIDER_SITE_OTHER): Payer: Federal, State, Local not specified - PPO | Admitting: Physician Assistant

## 2018-12-29 ENCOUNTER — Encounter: Payer: Self-pay | Admitting: Physician Assistant

## 2018-12-29 VITALS — BP 156/99 | HR 56

## 2018-12-29 DIAGNOSIS — M545 Low back pain, unspecified: Secondary | ICD-10-CM

## 2018-12-29 DIAGNOSIS — G43711 Chronic migraine without aura, intractable, with status migrainosus: Secondary | ICD-10-CM | POA: Diagnosis not present

## 2018-12-29 DIAGNOSIS — M5136 Other intervertebral disc degeneration, lumbar region: Secondary | ICD-10-CM | POA: Diagnosis not present

## 2018-12-29 DIAGNOSIS — F430 Acute stress reaction: Secondary | ICD-10-CM | POA: Diagnosis not present

## 2018-12-29 MED ORDER — KETOROLAC TROMETHAMINE 60 MG/2ML IM SOLN
60.0000 mg | Freq: Once | INTRAMUSCULAR | Status: AC
  Administered 2018-12-29: 60 mg via INTRAMUSCULAR

## 2018-12-29 NOTE — Progress Notes (Signed)
Subjective:    Patient ID: Angela Oliver, female    DOB: 04/08/1966, 53 y.o.   MRN: [id number]  HPI  Pt is a 53 yo female who walks into the office crying and asking to speak with me. She left work early today because "she can't take the harrassment". Pt has FMLA for as needed days for migraine flares. She feels like she has needed it less and less since starting aimvig. Her manager gives her a hard time about using FMLA. She feels like her job is threaten when he tells her she is not dependable and cannot keep a dependable schedule. In December she also fell at work and has hurt her back. She is going through [employer] to be worked up.  She feels like they are "trying to get her out". She has an employee under her that gives her attitude but her manager will not do anything about it and keeps putting him on her schedule. She does not feel supported and feels like her manager is trying to find a way to fire her. Today she could not take it and left in tears.    She does have a migraine today and her back hurts. No radiation into legs. No bowel or bladder dysfunction. No saddle anesthesia.   She is not taking her cymbalta.   .. Active Ambulatory Problems    Diagnosis Date Noted  . Allergic rhinitis 06/09/2011  . Asthma 06/09/2011  . GERD 06/09/2011  . 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 Herpes simplex 10/23/2012  . 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/2012  . Trochanteric bursitis of both hips 11/03/2012  . Trochanteric bursitis of right hip 11/22/2013  . Low back pain 02/19/2014  . 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 C7 radiculopathy 12/01/2015  . Obesity (BMI 30.0-34.9) 01/27/2017  . Pain of right breast 07/07/2017  . Family history of breast cancer in mother 07/07/2017  . Abnormal weight gain 07/07/2017  . Carpal tunnel syndrome on both sides 11/29/2017  . Dysfunction of left eustachian tube 02/01/2018   Resolved Ambulatory Problems     Diagnosis Date Noted  . RUQ PAIN 06/09/2011  . Migraines 10/23/2012  . Neck muscle spasm 11/03/2012  . Peroneal tendinitis of left lower extremity 12/29/2012  . Left foot pain 11/22/2013  . Foot pain, left 02/19/2014  . Poison ivy dermatitis 07/04/2014  . Acute bronchitis 09/16/2015  . Paresthesia of both hands 12/01/2015  . Calculus of gallbladder without cholecystitis without obstruction 05/03/2016  . Symptomatic cholelithiasis 06/01/2016  . S/P cholecystectomy 07/16/2016  . Low back sprain 07/28/2016   Past Medical History:  Diagnosis Date  . Anxiety   . Arthritis   . Asthma   . Complication of anesthesia   . Family history of adverse reaction to anesthesia   . GERD (gastroesophageal reflux disease)   . Irregular heart beat   . Seasonal allergies        Review of Systems See HPI.     Objective:   Physical Exam Vitals signs reviewed.  Constitutional:      Appearance: Normal appearance.  HENT:     Head: Normocephalic and atraumatic.  Cardiovascular:     Rate and Rhythm: Normal rate.     Pulses: Normal pulses.  Pulmonary:     Effort: Pulmonary effort is normal.     Breath sounds: Normal breath sounds.  Musculoskeletal:     Comments: Very little ROM at waist.  Tender over lumbar spine to palpation  and paraspinal muscles. More to the left than right.   Neurological:     General: No focal deficit present.     Mental Status: She is alert and oriented to person, place, and time.  Psychiatric:     Comments: tearful           Assessment & Plan:  Marland KitchenMarland KitchenDiagnoses and all orders for this visit:  Stress reaction -     ketorolac (TORADOL) injection 60 mg  DDD (degenerative disc disease), lumbar -     ketorolac (TORADOL) injection 60 mg  Acute bilateral low back pain without sciatica -     ketorolac (TORADOL) injection 60 mg  Intractable chronic migraine without aura and with status migrainosus   Pt did not fill out screening depression and anxiety since  she is in acute crisis today. She denies any SI/HC.  Pt needs to start back on cymbalta for pain and mood. I am not saying they her work situation is not hard but I discussed how I see no evidence of harrassment. She needs to try to talk to all her peers and see how they can all be better. I strongly recommend counseling.  Follow up in 4 weeks.   For migraine and low back pain today a shot of toradol was given.   Note for work given.   Continue follow up with ortho for workmans comp.   Marland KitchenMarland KitchenSpent 30 minutes with patient and greater than 50 percent of visit spent counseling patient regarding treatment plan.

## 2018-12-29 NOTE — Patient Instructions (Signed)
Start cymbalta daily.

## 2019-01-01 DIAGNOSIS — F430 Acute stress reaction: Secondary | ICD-10-CM | POA: Insufficient documentation

## 2019-01-01 DIAGNOSIS — M5136 Other intervertebral disc degeneration, lumbar region: Secondary | ICD-10-CM | POA: Insufficient documentation

## 2019-01-02 ENCOUNTER — Telehealth: Payer: Self-pay

## 2019-01-02 NOTE — Telephone Encounter (Signed)
Patient was wanting to see if we could possibly prescribe her Estradiol for her hot flashes she's been having. Patient states she has hot flashes about 2-3 times per week. I can order medication, I just wanted to check with you first. Thanks!

## 2019-01-03 NOTE — Telephone Encounter (Signed)
We will need to have an office visit related directly to Hormone replacement therapy. HRT does increase risk of breast cancer, stroke, DVT. We need to make sure BP is great and that mammogram is up to date. Order placed for mammogram in November and need to have that done.

## 2019-01-11 NOTE — Telephone Encounter (Signed)
Called and left a voicemail with information for patient. I also provided the office's contact information so she can call back and schedule that appointment.

## 2019-01-18 NOTE — Telephone Encounter (Signed)
Called and left another voicemail for patient. Patient is due for a follow-up appointment on 01/29/2019. Information can be discussed with patient at office visit if patient has not returned phone call to clinic before then.

## 2019-01-19 ENCOUNTER — Other Ambulatory Visit: Payer: Self-pay | Admitting: Sports Medicine

## 2019-01-19 DIAGNOSIS — G8929 Other chronic pain: Secondary | ICD-10-CM

## 2019-01-19 DIAGNOSIS — M545 Low back pain: Principal | ICD-10-CM

## 2019-01-19 DIAGNOSIS — M7062 Trochanteric bursitis, left hip: Secondary | ICD-10-CM

## 2019-01-19 DIAGNOSIS — M7061 Trochanteric bursitis, right hip: Secondary | ICD-10-CM

## 2019-01-29 ENCOUNTER — Ambulatory Visit: Payer: Federal, State, Local not specified - PPO | Admitting: Physician Assistant

## 2019-02-27 ENCOUNTER — Other Ambulatory Visit: Payer: Self-pay | Admitting: Physician Assistant

## 2019-02-27 DIAGNOSIS — J4530 Mild persistent asthma, uncomplicated: Secondary | ICD-10-CM

## 2019-02-27 DIAGNOSIS — J3089 Other allergic rhinitis: Secondary | ICD-10-CM

## 2019-03-02 DIAGNOSIS — Z119 Encounter for screening for infectious and parasitic diseases, unspecified: Secondary | ICD-10-CM | POA: Diagnosis not present

## 2019-03-02 DIAGNOSIS — J4520 Mild intermittent asthma, uncomplicated: Secondary | ICD-10-CM | POA: Diagnosis not present

## 2019-03-02 DIAGNOSIS — R0602 Shortness of breath: Secondary | ICD-10-CM | POA: Diagnosis not present

## 2019-03-02 DIAGNOSIS — Z03818 Encounter for observation for suspected exposure to other biological agents ruled out: Secondary | ICD-10-CM | POA: Diagnosis not present

## 2019-03-15 ENCOUNTER — Other Ambulatory Visit: Payer: Self-pay | Admitting: Sports Medicine

## 2019-03-15 DIAGNOSIS — G8929 Other chronic pain: Secondary | ICD-10-CM

## 2019-03-15 DIAGNOSIS — M545 Low back pain, unspecified: Secondary | ICD-10-CM

## 2019-03-15 DIAGNOSIS — M7061 Trochanteric bursitis, right hip: Secondary | ICD-10-CM

## 2019-03-15 DIAGNOSIS — M7062 Trochanteric bursitis, left hip: Secondary | ICD-10-CM

## 2019-03-16 ENCOUNTER — Encounter: Payer: Self-pay | Admitting: Physician Assistant

## 2019-03-16 ENCOUNTER — Ambulatory Visit (INDEPENDENT_AMBULATORY_CARE_PROVIDER_SITE_OTHER): Payer: Federal, State, Local not specified - PPO | Admitting: Physician Assistant

## 2019-03-16 VITALS — HR 77 | Temp 97.7°F | Wt 205.0 lb

## 2019-03-16 DIAGNOSIS — J3089 Other allergic rhinitis: Secondary | ICD-10-CM

## 2019-03-16 DIAGNOSIS — J302 Other seasonal allergic rhinitis: Secondary | ICD-10-CM

## 2019-03-16 DIAGNOSIS — J4530 Mild persistent asthma, uncomplicated: Secondary | ICD-10-CM | POA: Diagnosis not present

## 2019-03-16 MED ORDER — PREDNISONE 50 MG PO TABS: ORAL_TABLET | ORAL | 0 refills | Status: DC

## 2019-03-16 MED ORDER — BECLOMETHASONE DIPROPIONATE 80 MCG/ACT IN AERS: 2.0000 | INHALATION_SPRAY | Freq: Two times a day (BID) | RESPIRATORY_TRACT | 3 refills | Status: AC

## 2019-03-16 MED ORDER — ALBUTEROL SULFATE HFA 108 (90 BASE) MCG/ACT IN AERS: 2.0000 | INHALATION_SPRAY | Freq: Four times a day (QID) | RESPIRATORY_TRACT | 0 refills | Status: DC | PRN

## 2019-03-16 NOTE — Progress Notes (Signed)
Subjective:    Patient ID: Angela Oliver, female    DOB: December 23, 1965, 53 y.o.   MRN: [id number]  HPI  Pt is a 53 yo female with hx of mild asthma and seasonal allergies who calls in via webex to discuss plan for her to go back to work. Pt works [place] a [place]. 2 weeks ago she was tested for COVID due to symptoms and exposure on 3/20. She tested negative. She does feel better. She is still having some chest tightness and cough likely assoicated with asthma. Albuterol does help this. In the past prednisone has helped her a lot. She has not been taking her qvar for over a year. She is on zyrtec and singular.  Pt is concerned due to high risk job and preexisiting conditions if she should return to work.   .. Active Ambulatory Problems    Diagnosis Date Noted  . Allergic rhinitis 06/09/2011  . Asthma 06/09/2011  . GERD 06/09/2011  . 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 Herpes simplex 10/23/2012  . 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/2012  . Trochanteric bursitis of both hips 11/03/2012  . Trochanteric bursitis of right hip 11/22/2013  . Low back pain 02/19/2014  . 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 C7 radiculopathy 12/01/2015  . Obesity (BMI 30.0-34.9) 01/27/2017  . Pain of right breast 07/07/2017  . Family history of breast cancer in mother 07/07/2017  . Abnormal weight gain 07/07/2017  . Carpal tunnel syndrome on both sides 11/29/2017  . Dysfunction of left eustachian tube 02/01/2018  . Stress reaction 01/01/2019  . DDD (degenerative disc disease), lumbar 01/01/2019  . Seasonal allergies 03/18/2019   Resolved Ambulatory Problems    Diagnosis Date Noted  . RUQ PAIN 06/09/2011  . Migraines 10/23/2012  . Neck muscle spasm 11/03/2012  . Peroneal tendinitis of left lower extremity 12/29/2012  . Left foot pain 11/22/2013  . Foot pain, left 02/19/2014  . Poison ivy dermatitis 07/04/2014  . Acute bronchitis 09/16/2015  .  Paresthesia of both hands 12/01/2015  . Calculus of gallbladder without cholecystitis without obstruction 05/03/2016  . Symptomatic cholelithiasis 06/01/2016  . S/P cholecystectomy 07/16/2016  . Low back sprain 07/28/2016   Past Medical History:  Diagnosis Date  . Anxiety   . Arthritis   . Asthma   . Complication of anesthesia   . Family history of adverse reaction to anesthesia   . GERD (gastroesophageal reflux disease)   . Irregular heart beat          Review of Systems See HPI.     Objective:   Physical Exam Vitals signs reviewed.  Cardiovascular:     Rate and Rhythm: Normal rate.  Pulmonary:     Effort: Pulmonary effort is normal.  Neurological:     General: No focal deficit present.     Mental Status: She is alert and oriented to person, place, and time.  Psychiatric:        Mood and Affect: Mood normal.           Assessment & Plan:  Marland KitchenMarland KitchenDiagnoses and all orders for this visit:  Mild persistent asthma without complication -     albuterol (PROVENTIL HFA;VENTOLIN HFA) 108 (90 Base) MCG/ACT inhaler; Inhale 2 puffs into the lungs every 6 (six) hours as needed for wheezing. -     beclomethasone (QVAR) 80 MCG/ACT inhaler; Inhale 2 puffs into the lungs 2 (two) times daily. -     predniSONE (DELTASONE)  50 MG tablet; One tab PO daily for 5 days.  Non-seasonal allergic rhinitis, unspecified trigger  Seasonal allergies   Letter written to keep out of work during pandemic peak season.   Restart qvar and use albuterol as needed. I printed prednisone if wheezing and more SOB. She has been quarantine and risk of recent COVID exposure is low. I did discuss if she starts prednisone data has shown it could make COVID symptoms worse. She understands and will hold prednisone only as needed. Discussed DO NOT take if running fever.   Marland Kitchen.Spent 30 minutes with patient and greater than 50 percent of visit spent counseling patient regarding treatment plan.

## 2019-03-18 ENCOUNTER — Encounter: Payer: Self-pay | Admitting: Physician Assistant

## 2019-03-18 DIAGNOSIS — J302 Other seasonal allergic rhinitis: Secondary | ICD-10-CM | POA: Insufficient documentation

## 2019-04-24 ENCOUNTER — Other Ambulatory Visit: Payer: Self-pay | Admitting: Physician Assistant

## 2019-04-24 DIAGNOSIS — G43711 Chronic migraine without aura, intractable, with status migrainosus: Secondary | ICD-10-CM

## 2019-05-06 IMAGING — DX DG LUMBAR SPINE COMPLETE 4+V
5 series · 5 of 5 positions shown · non-contrast
Comparison: CT abdomen and pelvis 10/28/2018

CLINICAL DATA: Fell this morning, mid and low back pain, RIGHT hip
pain, initial encounter

EXAM:
LUMBAR SPINE - COMPLETE 4+ VIEW

[l-spine ap]
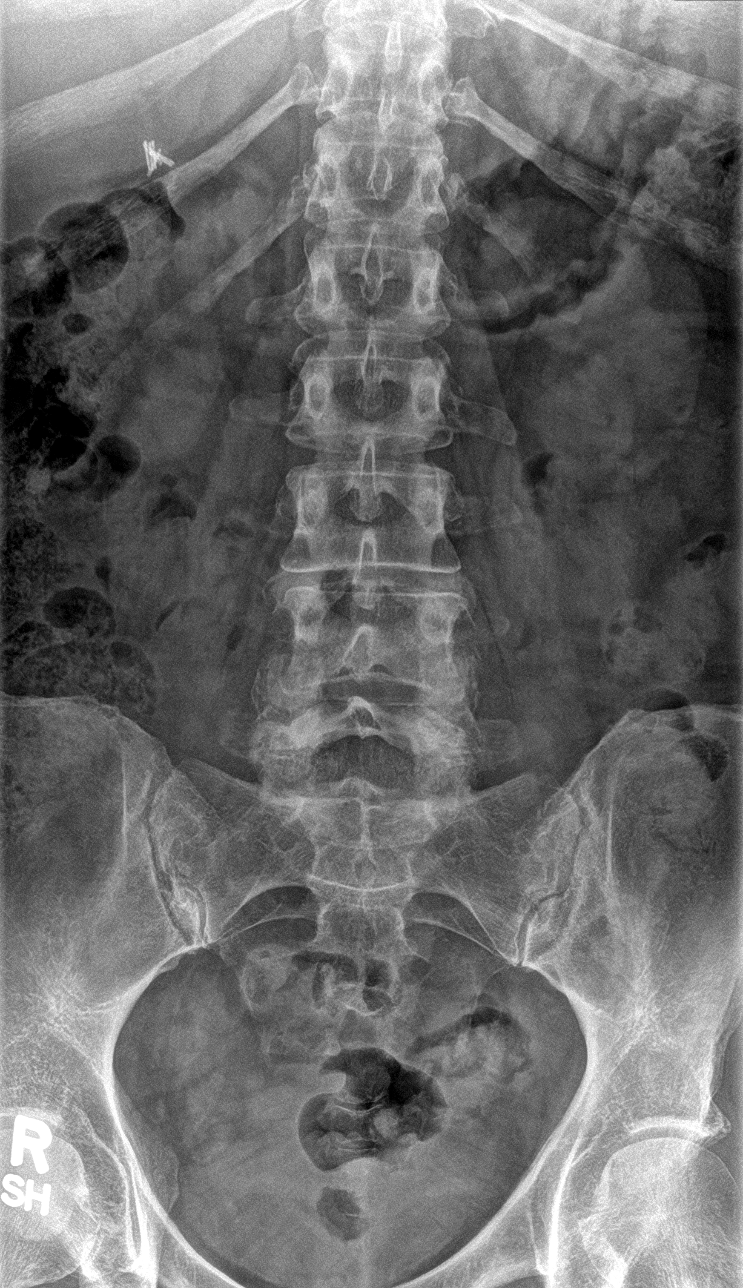

[l-spine obl (1 of 2)]
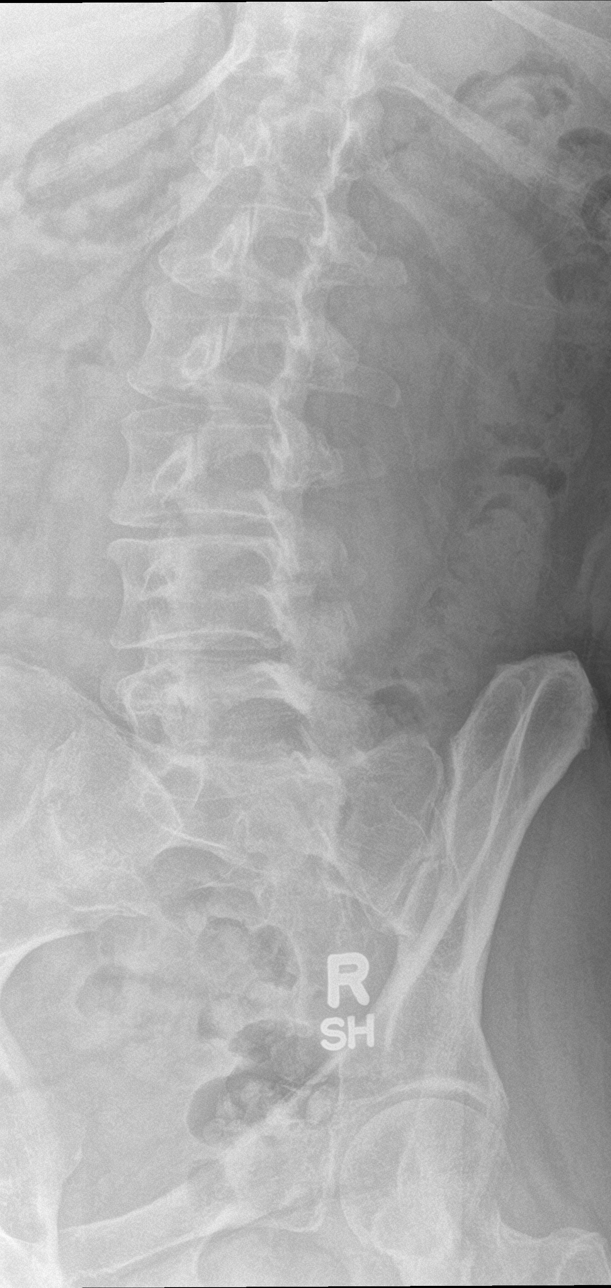

[l-spine obl (2 of 2)]
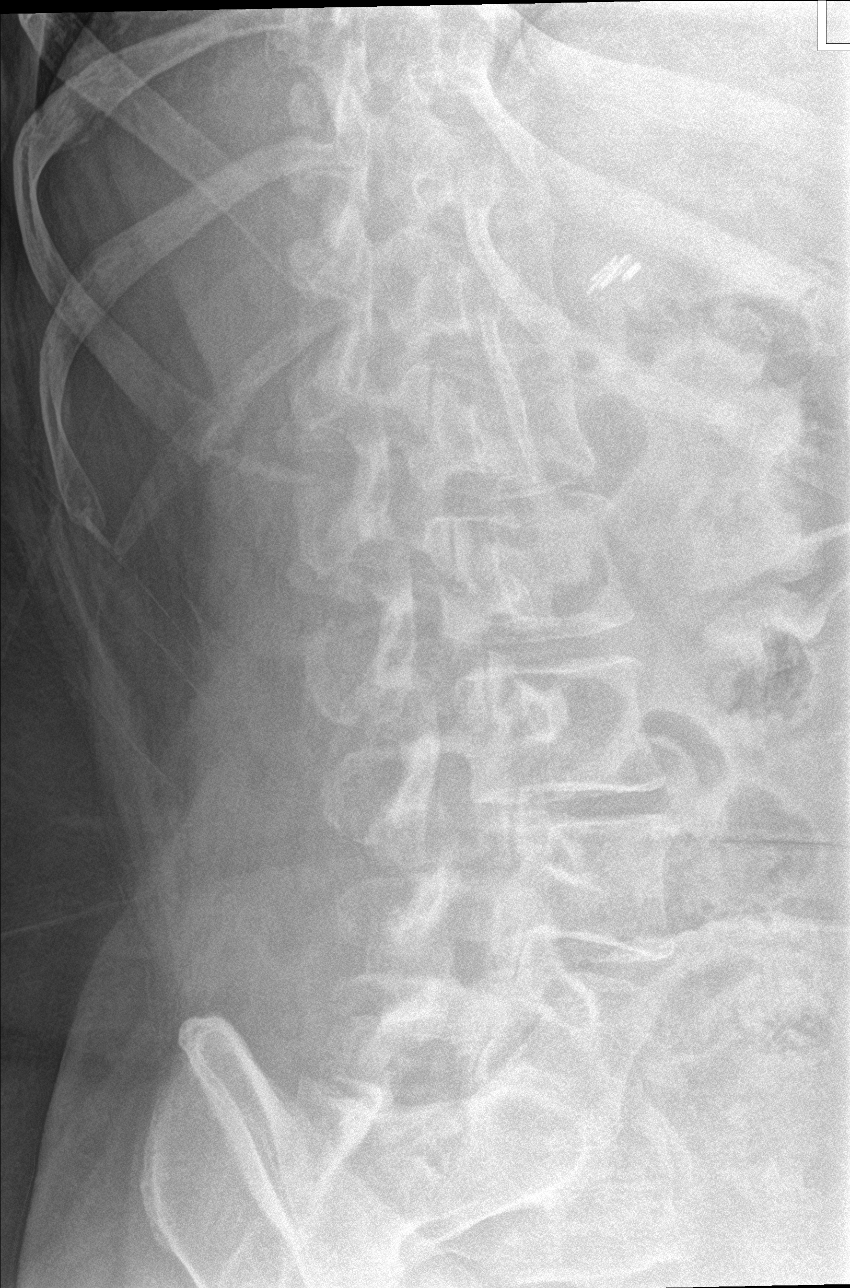

[l-spine lat]
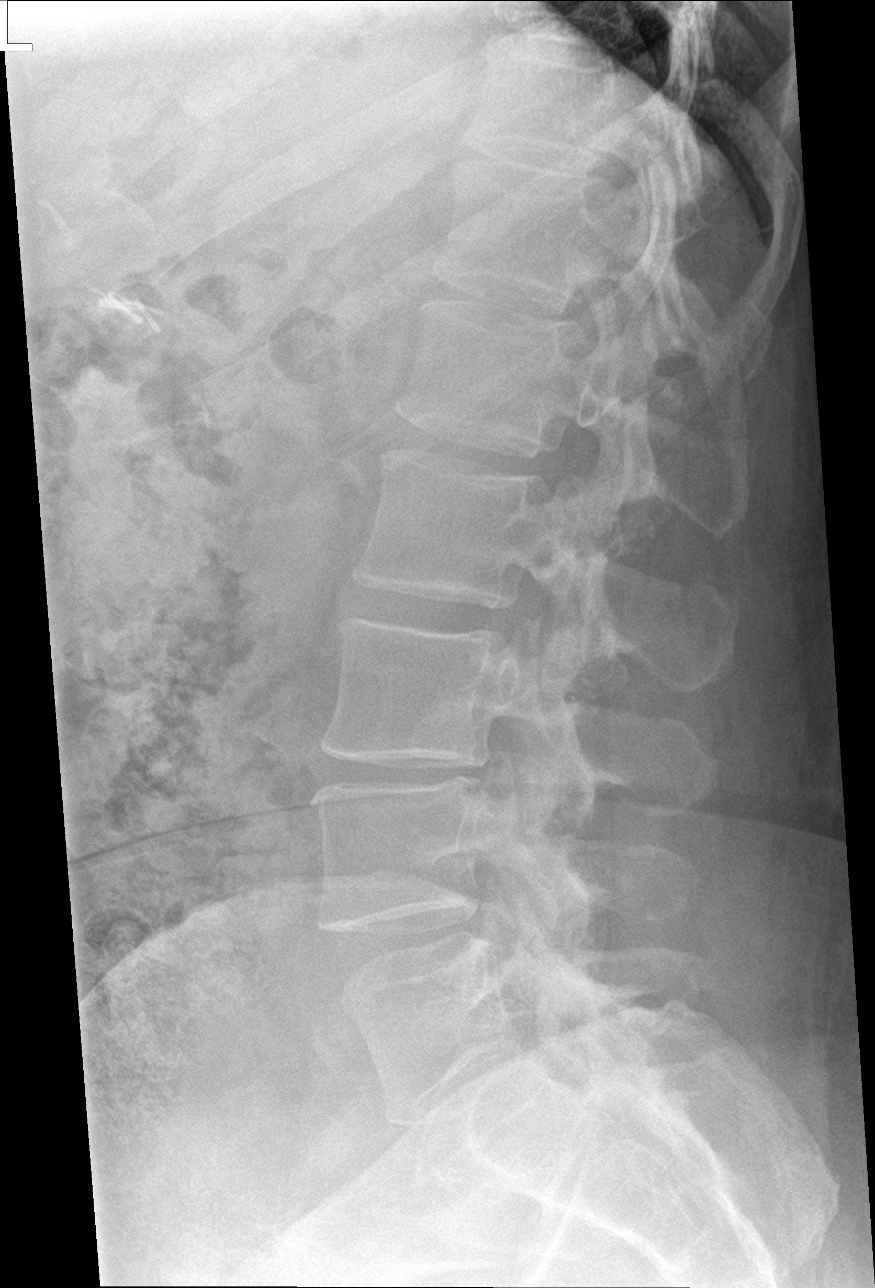

[l-spine spot]
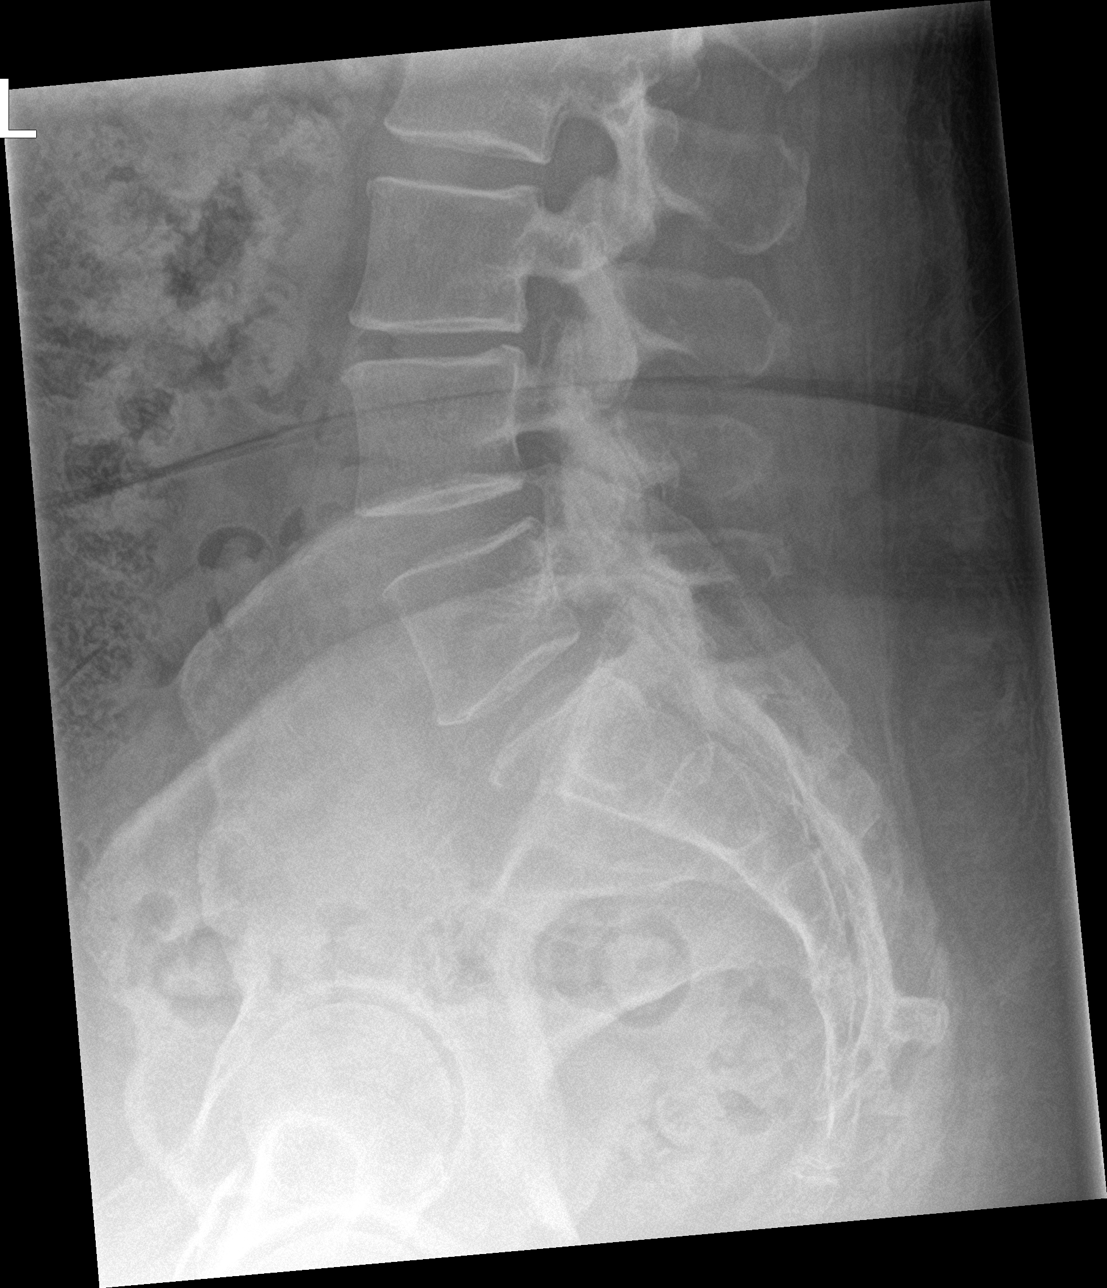

[5 of 5 positions shown; findings below may reference images not displayed]

FINDINGS: Five non-rib-bearing lumbar vertebra.

Bones appear slightly demineralized.

Vertebral body heights maintained.

Slight disc space narrowing L3-L4.

No fracture, subluxation, or bone destruction.

No definite spondylolysis.

SI joints preserved.
IMPRESSION: No acute osseous abnormalities.

## 2019-05-30 ENCOUNTER — Other Ambulatory Visit: Payer: Self-pay | Admitting: Physician Assistant

## 2019-05-30 DIAGNOSIS — K219 Gastro-esophageal reflux disease without esophagitis: Secondary | ICD-10-CM

## 2019-06-14 ENCOUNTER — Telehealth: Payer: Self-pay | Admitting: Physician Assistant

## 2019-06-14 NOTE — Telephone Encounter (Signed)
PT requested a note for work.  PT wants to only wear cloth or nylon mask due to the fibers/comfortability of the mask.   When done email note.   [email or web address]

## 2019-06-18 NOTE — Telephone Encounter (Signed)
Letter written and given to front desk to email.  

## 2019-06-18 NOTE — Telephone Encounter (Signed)
Ok for note 

## 2019-06-29 ENCOUNTER — Encounter: Payer: Self-pay | Admitting: Physician Assistant

## 2019-06-29 ENCOUNTER — Ambulatory Visit (INDEPENDENT_AMBULATORY_CARE_PROVIDER_SITE_OTHER): Payer: Federal, State, Local not specified - PPO | Admitting: Physician Assistant

## 2019-06-29 ENCOUNTER — Other Ambulatory Visit: Payer: Self-pay

## 2019-06-29 VITALS — BP 134/76 | HR 83 | Temp 99.1°F | Ht 64.0 in | Wt 201.0 lb

## 2019-06-29 DIAGNOSIS — J4530 Mild persistent asthma, uncomplicated: Secondary | ICD-10-CM

## 2019-06-29 DIAGNOSIS — R509 Fever, unspecified: Secondary | ICD-10-CM

## 2019-06-29 DIAGNOSIS — R059 Cough, unspecified: Secondary | ICD-10-CM

## 2019-06-29 DIAGNOSIS — R05 Cough: Secondary | ICD-10-CM

## 2019-06-29 MED ORDER — PREDNISONE 50 MG PO TABS: ORAL_TABLET | ORAL | 0 refills | Status: DC

## 2019-06-29 NOTE — Progress Notes (Signed)
Patient ID: Angela Oliver, female   DOB: Sep 11, 1966, 53 y.o.   MRN: [id number] .Marland KitchenVirtual Visit via Telephone Note  I connected with Angela Oliver on 07/02/19 at  3:20 PM EDT by telephone and verified that I am speaking with the correct person using two identifiers.  Location: Patient: car Provider: clinic   I discussed the limitations, risks, security and privacy concerns of performing an evaluation and management service by telephone and the availability of in person appointments. I also discussed with the patient that there may be a patient responsible charge related to this service. The patient expressed understanding and agreed to proceed.   History of Present Illness: Pt is a 53 yo female with asthma and migraines who intitally came into the clinic to discuss cough but she was escorted back to car when she had a low grade fever and cough in office. She continues to take qvar, singulair, zyrtec daily for asthma. She uses albuterol only has needed. She works in [place]. She had similar symptoms in march where she was tested for COVId and negative. Symptoms cleared with prednisone and cough rx.   Recent symptoms started Monday she has been out of work all week. She has a cough and short of breath. No ear pain, sinus pressure. She has not had a fever until office and low grade. No known sick contacts but deals with the public. When her asthma flares she has a lot of problems using mask at work and why she could not go in. No GI symptoms.    .. Active Ambulatory Problems    Diagnosis Date Noted  . Allergic rhinitis 06/09/2011  . Asthma 06/09/2011  . GERD 06/09/2011  . 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 Herpes simplex 10/23/2012  . 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/2012  . Trochanteric bursitis of both hips 11/03/2012  . Trochanteric bursitis of right hip 11/22/2013  . Low back pain 02/19/2014  . Intractable chronic migraine without aura and with status migrainosus  10/02/2014  . C7 radiculopathy 12/01/2015  . Obesity (BMI 30.0-34.9) 01/27/2017  . Pain of right breast 07/07/2017  . Family history of breast cancer in mother 07/07/2017  . Abnormal weight gain 07/07/2017  . Carpal tunnel syndrome on both sides 11/29/2017  . Dysfunction of left eustachian tube 02/01/2018  . Stress reaction 01/01/2019  . DDD (degenerative disc disease), lumbar 01/01/2019  . Seasonal allergies 03/18/2019   Resolved Ambulatory Problems    Diagnosis Date Noted  . RUQ PAIN 06/09/2011  . Migraines 10/23/2012  . Neck muscle spasm 11/03/2012  . Peroneal tendinitis of left lower extremity 12/29/2012  . Left foot pain 11/22/2013  . Foot pain, left 02/19/2014  . Poison ivy dermatitis 07/04/2014  . Acute bronchitis 09/16/2015  . Paresthesia of both hands 12/01/2015  . Calculus of gallbladder without cholecystitis without obstruction 05/03/2016  . Symptomatic cholelithiasis 06/01/2016  . S/P cholecystectomy 07/16/2016  . Low back sprain 07/28/2016   Past Medical History:  Diagnosis Date  . Anxiety   . Arthritis   . Asthma   . Complication of anesthesia   . Family history of adverse reaction to anesthesia   . GERD (gastroesophageal reflux disease)   . Irregular heart beat    Reviewed med, allergy, problem list.     Observations/Objective: No acute distress. Normal breathing.  Normal mood.   .. Today's Vitals   06/29/19 1531  BP: 134/76  Pulse: 83  Temp: 99.1 F (37.3 C)  TempSrc: Oral  SpO2: 98%  Weight:  201 lb (91.2 kg)  Height: 5\' 4"  (1.626 m)   Body mass index is 34.5 kg/m.    Assessment and Plan: Marland Kitchen.Marland Kitchen.Angela Oliver was seen today for asthma.  Diagnoses and all orders for this visit:  Cough  Mild persistent asthma without complication -     predniSONE (DELTASONE) 50 MG tablet; One tab PO daily for 5 days.  Low grade fever   Written out of work until [employer] GroupCOVID tested. Likely asthma exacerbation. Sent prednisone. Continue asthma treatment plan. Rest,  hydrate, tylenol. Discussed worsening breathing go to ER. If COVID positive will need to self isolate for 14 days since symptoms started and/or 3 days after resolution of fever.    Follow Up Instructions:    I discussed the assessment and treatment plan with the patient. The patient was provided an opportunity to ask questions and all were answered. The patient agreed with the plan and demonstrated an understanding of the instructions.   The patient was advised to call back or seek an in-person evaluation if the symptoms worsen or if the condition fails to improve as anticipated.  I provided 15 minutes of non-face-to-face time during this encounter.   Tandy GawJade Breeback, PA-C

## 2019-06-30 DIAGNOSIS — Z1159 Encounter for screening for other viral diseases: Secondary | ICD-10-CM | POA: Diagnosis not present

## 2019-06-30 DIAGNOSIS — R0602 Shortness of breath: Secondary | ICD-10-CM | POA: Diagnosis not present

## 2019-07-03 ENCOUNTER — Encounter: Payer: Self-pay | Admitting: Neurology

## 2019-07-10 ENCOUNTER — Telehealth: Payer: Self-pay | Admitting: Neurology

## 2019-07-10 NOTE — Telephone Encounter (Signed)
Patient called stating her Covid test was negative and she needs documentation of that in a letter from [place] to return to work. We do not have access to those test results. She will bring [place] a copy and we can write her a letter. She expressed understanding.

## 2019-07-12 ENCOUNTER — Telehealth: Payer: Self-pay | Admitting: Physician Assistant

## 2019-07-28 ENCOUNTER — Other Ambulatory Visit: Payer: Self-pay | Admitting: Physician Assistant

## 2019-07-28 DIAGNOSIS — G43711 Chronic migraine without aura, intractable, with status migrainosus: Secondary | ICD-10-CM

## 2019-08-09 ENCOUNTER — Emergency Department (HOSPITAL_BASED_OUTPATIENT_CLINIC_OR_DEPARTMENT_OTHER): Payer: Federal, State, Local not specified - PPO

## 2019-08-09 ENCOUNTER — Emergency Department (HOSPITAL_BASED_OUTPATIENT_CLINIC_OR_DEPARTMENT_OTHER)
Admission: EM | Admit: 2019-08-09 | Discharge: 2019-08-09 | Disposition: A | Discharge status: Home / Self Care | Payer: Federal, State, Local not specified - PPO | Attending: Emergency Medicine | Admitting: Emergency Medicine

## 2019-08-09 ENCOUNTER — Other Ambulatory Visit: Payer: Self-pay

## 2019-08-09 ENCOUNTER — Encounter (HOSPITAL_BASED_OUTPATIENT_CLINIC_OR_DEPARTMENT_OTHER): Payer: Self-pay | Admitting: Emergency Medicine

## 2019-08-09 DIAGNOSIS — J45909 Unspecified asthma, uncomplicated: Secondary | ICD-10-CM | POA: Insufficient documentation

## 2019-08-09 DIAGNOSIS — N200 Calculus of kidney: Secondary | ICD-10-CM | POA: Insufficient documentation

## 2019-08-09 DIAGNOSIS — Z79899 Other long term (current) drug therapy: Secondary | ICD-10-CM | POA: Diagnosis not present

## 2019-08-09 DIAGNOSIS — N1330 Unspecified hydronephrosis: Secondary | ICD-10-CM | POA: Diagnosis not present

## 2019-08-09 DIAGNOSIS — R1011 Right upper quadrant pain: Secondary | ICD-10-CM | POA: Diagnosis not present

## 2019-08-09 LAB — COMPREHENSIVE METABOLIC PANEL
ALT: 24 U/L (ref 0–44)
AST: 24 U/L (ref 15–41)
Albumin: 4.1 g/dL (ref 3.5–5.0)
Alkaline Phosphatase: 72 U/L (ref 38–126)
Anion gap: 12 (ref 5–15)
BUN: 22 mg/dL — ABNORMAL HIGH (ref 6–20)
CO2: 20 mmol/L — ABNORMAL LOW (ref 22–32)
Calcium: 10.1 mg/dL (ref 8.9–10.3)
Chloride: 107 mmol/L (ref 98–111)
Creatinine, Ser: 0.9 mg/dL (ref 0.44–1.00)
GFR calc Af Amer: >60 mL/min (ref >60)
GFR calc non Af Amer: >60 mL/min (ref >60)
Glucose, Bld: 109 mg/dL — ABNORMAL HIGH (ref 70–99)
Potassium: 3.7 mmol/L (ref 3.5–5.1)
Sodium: 139 mmol/L (ref 135–145)
Total Bilirubin: 1.2 mg/dL (ref 0.3–1.2)
Total Protein: 7.1 g/dL (ref 6.5–8.1)

## 2019-08-09 LAB — CBC WITH DIFFERENTIAL/PLATELET
Abs Immature Granulocytes: 0.05 10*3/uL (ref 0.00–0.07)
Basophils Absolute: 0 10*3/uL (ref 0.0–0.1)
Basophils Relative: 1 %
Eosinophils Absolute: 0.2 10*3/uL (ref 0.0–0.5)
Eosinophils Relative: 2 %
HCT: 40.5 % (ref 36.0–46.0)
Hemoglobin: 13.8 g/dL (ref 12.0–15.0)
Immature Granulocytes: 1 %
Lymphocytes Relative: 32 %
Lymphs Abs: 2.3 10*3/uL (ref 0.7–4.0)
MCH: 30.3 pg (ref 26.0–34.0)
MCHC: 34.1 g/dL (ref 30.0–36.0)
MCV: 89 fL (ref 80.0–100.0)
Monocytes Absolute: 0.5 10*3/uL (ref 0.1–1.0)
Monocytes Relative: 7 %
Neutro Abs: 4.2 10*3/uL (ref 1.7–7.7)
Neutrophils Relative %: 57 %
Platelets: 231 10*3/uL (ref 150–400)
RBC: 4.55 MIL/uL (ref 3.87–5.11)
RDW: 12.1 % (ref 11.5–15.5)
WBC: 7.3 10*3/uL (ref 4.0–10.5)
nRBC: 0 % (ref 0.0–0.2)

## 2019-08-09 LAB — URINALYSIS, ROUTINE W REFLEX MICROSCOPIC

## 2019-08-09 LAB — URINALYSIS, MICROSCOPIC (REFLEX)
RBC / HPF: >50 RBC/hpf (ref 0–5)
WBC, UA: >50 WBC/hpf (ref 0–5)

## 2019-08-09 MED ORDER — KETOROLAC TROMETHAMINE 30 MG/ML IJ SOLN
30.0000 mg | Freq: Once | INTRAMUSCULAR | Status: AC
  Administered 2019-08-09: 30 mg via INTRAVENOUS
  Filled 2019-08-09: qty 1

## 2019-08-09 MED ORDER — ONDANSETRON HCL 4 MG/2ML IJ SOLN
4.0000 mg | Freq: Once | INTRAMUSCULAR | Status: AC
  Administered 2019-08-09: 4 mg via INTRAVENOUS
  Filled 2019-08-09: qty 2

## 2019-08-09 MED ORDER — OXYCODONE-ACETAMINOPHEN 5-325 MG PO TABS: 1.0000 | ORAL_TABLET | Freq: Four times a day (QID) | ORAL | 0 refills | Status: DC | PRN

## 2019-08-09 MED ORDER — CIPROFLOXACIN HCL 500 MG PO TABS: 500.0000 mg | ORAL_TABLET | Freq: Two times a day (BID) | ORAL | 0 refills | Status: DC

## 2019-08-09 MED ORDER — MORPHINE SULFATE (PF) 4 MG/ML IV SOLN
4.0000 mg | Freq: Once | INTRAVENOUS | Status: AC
  Administered 2019-08-09: 4 mg via INTRAVENOUS
  Filled 2019-08-09: qty 1

## 2019-08-09 NOTE — Discharge Instructions (Signed)
Begin taking Cipro as prescribed today.  Percocet as prescribed as needed for pain.  Follow-up with urology if your symptoms or not improving in the next 3 days.  The contact information for [facility] has been provided in this discharge summary for you to call and make these arrangements.  Return to the emergency department in the meantime if you develop worsening pain, high fever, or other new and concerning symptoms.

## 2019-08-09 NOTE — ED Triage Notes (Signed)
Pt reports right flank pain radiating to lower abdomen , hematuria started at 6 am today. Hx kidney stone. Appears in several pian .

## 2019-08-09 NOTE — ED Provider Notes (Signed)
[facility] EMERGENCY DEPARTMENT Provider Note   CSN: [id number] Arrival date & time: 08/09/19  0720     History   Chief Complaint Chief Complaint  Patient presents with  . Flank Pain    right    HPI Angela Oliver is a 53 y.o. female.     Patient is a 53 year old female with history of prior kidney stones.  She presents today with complaints of severe pain in her right flank and right upper quadrant.  This began at approximately 6 AM while at work.  She denies any dysuria, but does report hematuria.  She denies fevers or chills.  The history is provided by the patient.  Flank Pain This is a recurrent problem. Episode onset: 6 AM. The problem occurs constantly. The problem has been rapidly worsening. Associated symptoms include abdominal pain. Nothing aggravates the symptoms. Nothing relieves the symptoms. She has tried nothing for the symptoms.    Past Medical History:  Diagnosis Date  . Anxiety   . Arthritis   . Asthma   . Complication of anesthesia    slow to awaken  . Family history of adverse reaction to anesthesia    mother slow to awaken  . GERD (gastroesophageal reflux disease)   . Herpes simplex   . Irregular heart beat    in the past- no palpations- "skipped a beat"  . Migraines   . Seasonal allergies     Patient Active Problem List   Diagnosis Date Noted  . Seasonal allergies 03/18/2019  . Stress reaction 01/01/2019  . DDD (degenerative disc disease), lumbar 01/01/2019  . Dysfunction of left eustachian tube 02/01/2018  . Carpal tunnel syndrome on both sides 11/29/2017  . Pain of right breast 07/07/2017  . Family history of breast cancer in mother 07/07/2017  . Abnormal weight gain 07/07/2017  . Obesity (BMI 30.0-34.9) 01/27/2017  . C7 radiculopathy 12/01/2015  . 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 Low back pain 02/19/2014  . Trochanteric bursitis of right hip 11/22/2013  . Plantar  fasciitis/fibromatosis 11/03/2012  . Trochanteric bursitis of both hips 11/03/2012  . 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 Herpes simplex 10/23/2012  . Allergic rhinitis 06/09/2011  . Asthma 06/09/2011  . GERD 06/09/2011    Past Surgical History:  Procedure Laterality Date  . APPENDECTOMY    . CHOLECYSTECTOMY  06/01/2016   laproscopic   . CHOLECYSTECTOMY N/A 06/01/2016   Procedure: LAPAROSCOPIC CHOLECYSTECTOMY WITH INTRAOPERATIVE CHOLANGIOGRAM, REMOVAL PERITONEAL NODULE ADJACENT TO SIGMOID COLON;  Surgeon: Avel Peace, MD;  Location: [facility] OR;  Service: General;  Laterality: N/A;  . COLONOSCOPY     age 44  . OVARIAN CYST REMOVAL Left    part of ovary- cyst hadf ruptured     OB History   No obstetric history on file.      Home Medications    Prior to Admission medications   Medication Sig Start Date End Date Taking? Authorizing Provider  AIMOVIG 140 MG/ML SOAJ INJECT 1 PEN INTO THE SKIN EVERY 30 (THIRTY) DAYS. 07/30/19   Breeback, Jade L, PA-C  albuterol (PROVENTIL HFA;VENTOLIN HFA) 108 (90 Base) MCG/ACT inhaler Inhale 2 puffs into the lungs every 6 (six) hours as needed for wheezing. 03/16/19   Breeback, Jade L, PA-C  Albuterol Sulfate (PROAIR RESPICLICK) 108 (90 Base) MCG/ACT AEPB Inhale 2 puffs into the lungs every 4 (four) hours as needed (shortness of breath/ wheezing). 01/23/18   Carlis Stable, PA-C  beclomethasone (QVAR) 80  MCG/ACT inhaler Inhale 2 puffs into the lungs 2 (two) times daily. 03/16/19   Breeback, Jade L, PA-C  celecoxib (CELEBREX) 200 MG capsule TAKE 1 TO 2 TABLETS BY MOUTH DAILY AS NEEDED FOR PAIN. 03/15/19   Monica Bectonhekkekandam, Thomas J, MD  cetirizine (ZYRTEC) 10 MG tablet Take 1 tablet (10 mg total) by mouth daily. 07/12/18   Breeback, Jade L, PA-C  DULoxetine (CYMBALTA) 30 MG capsule TAKE 1 CAPSULE BY MOUTH EVERY DAY 12/25/18   Breeback, Jade L, PA-C  montelukast (SINGULAIR) 10 MG tablet TAKE 1 TABLET BY MOUTH EVERY DAY 02/28/19   Breeback, Jade L, PA-C   omeprazole (PRILOSEC) 40 MG capsule TAKE 1 CAPSULE BY MOUTH EVERY DAY 05/30/19   Breeback, Jade L, PA-C  predniSONE (DELTASONE) 50 MG tablet One tab PO daily for 5 days. 06/29/19   Breeback, Lonna CobbJade L, PA-C  propranolol (INDERAL) 40 MG tablet Take 1 tablet (40 mg total) by mouth 2 (two) times daily. 07/12/18   Jomarie LongsBreeback, Jade L, PA-C    Family History Family History  Problem Relation Age of Onset  . Cancer Mother        breast cancer  . Alcohol abuse Father   . Diabetes Father   . Hyperlipidemia Father   . Asthma Other     Social History Social History   Tobacco Use  . Smoking status: Never Smoker  . Smokeless tobacco: Never Used  Substance Use Topics  . Alcohol use: No  . Drug use: No     Allergies   Sulfa antibiotics and Topamax [topiramate]   Review of Systems Review of Systems  Gastrointestinal: Positive for abdominal pain.  Genitourinary: Positive for flank pain.  All other systems reviewed and are negative.    Physical Exam Updated Vital Signs BP (!) 152/82 (BP Location: Right Arm)   Pulse 63   Temp 97.7 F (36.5 C) (Oral)   Resp (!) 22   Ht 5\' 4"  (1.626 m)   Wt 90.7 kg   LMP 04/15/2017 (Exact Date)   SpO2 100%   BMI 34.33 kg/m   Physical Exam Vitals signs and nursing note reviewed.  Constitutional:      General: She is not in acute distress.    Appearance: She is well-developed. She is not diaphoretic.     Comments: She appears uncomfortable.  HENT:     Head: Normocephalic and atraumatic.  Neck:     Musculoskeletal: Normal range of motion and neck supple.  Cardiovascular:     Rate and Rhythm: Normal rate and regular rhythm.     Heart sounds: No murmur. No friction rub. No gallop.   Pulmonary:     Effort: Pulmonary effort is normal. No respiratory distress.     Breath sounds: Normal breath sounds. No wheezing.  Abdominal:     General: Bowel sounds are normal. There is no distension.     Palpations: Abdomen is soft.     Tenderness: There is  abdominal tenderness. There is right CVA tenderness.     Comments: There is tenderness to palpation in the right upper quadrant and right flank.  Musculoskeletal: Normal range of motion.  Skin:    General: Skin is warm and dry.  Neurological:     Mental Status: She is alert and oriented to person, place, and time.      ED Treatments / Results  Labs (all labs ordered are listed, but only abnormal results are displayed) Labs Reviewed  COMPREHENSIVE METABOLIC PANEL  CBC WITH DIFFERENTIAL/PLATELET  URINALYSIS,  ROUTINE W REFLEX MICROSCOPIC    EKG None  Radiology No results found.  Procedures Procedures (including critical care time)  Medications Ordered in ED Medications  ketorolac (TORADOL) 30 MG/ML injection 30 mg (has no administration in time range)  morphine 4 MG/ML injection 4 mg (has no administration in time range)  ondansetron (ZOFRAN) injection 4 mg (has no administration in time range)     Initial Impression / Assessment and Plan / ED Course  I have reviewed the triage vital signs and the nursing notes.  Pertinent labs & imaging results that were available during my care of the patient were reviewed by me and considered in my medical decision making (see chart for details).  Patient with history of renal calculi presenting with complaints of right flank pain.  Her presentation is most consistent with a renal calculus.  This was confirmed with CT scan.  She has a 3 mm stone in the proximal ureter with mild hydronephrosis.  Patient feeling better after receiving pain medication here in the ER.  Patient with no white count and is afebrile.  Urinalysis showing brown, turbid urine with greater than 50 RBCs, greater than 50 WBCs, and many bacteria.  Whether this represents infection or not I am uncertain.  Patient will be given an antibiotic, but does not appear septic or acutely ill.  At this point, I feel as though discharge is appropriate.  She will be given Cipro,  pain medication, and is to follow-up with urology if not improving, and return to the ER if symptoms worsen or change.  Final Clinical Impressions(s) / ED Diagnoses   Final diagnoses:  None    ED Discharge Orders    None       Veryl Speak, MD 08/09/19 [phone]

## 2019-09-10 ENCOUNTER — Ambulatory Visit (INDEPENDENT_AMBULATORY_CARE_PROVIDER_SITE_OTHER): Payer: Federal, State, Local not specified - PPO | Admitting: Physician Assistant

## 2019-09-10 ENCOUNTER — Other Ambulatory Visit: Payer: Self-pay

## 2019-09-10 ENCOUNTER — Encounter: Payer: Self-pay | Admitting: Physician Assistant

## 2019-09-10 VITALS — BP 126/75 | HR 77 | Ht 64.0 in | Wt 200.0 lb

## 2019-09-10 DIAGNOSIS — N200 Calculus of kidney: Secondary | ICD-10-CM

## 2019-09-10 DIAGNOSIS — R109 Unspecified abdominal pain: Secondary | ICD-10-CM

## 2019-09-10 DIAGNOSIS — Z23 Encounter for immunization: Secondary | ICD-10-CM | POA: Diagnosis not present

## 2019-09-10 DIAGNOSIS — R1031 Right lower quadrant pain: Secondary | ICD-10-CM

## 2019-09-10 DIAGNOSIS — G43711 Chronic migraine without aura, intractable, with status migrainosus: Secondary | ICD-10-CM

## 2019-09-10 DIAGNOSIS — N281 Cyst of kidney, acquired: Secondary | ICD-10-CM | POA: Diagnosis not present

## 2019-09-10 DIAGNOSIS — Z1231 Encounter for screening mammogram for malignant neoplasm of breast: Secondary | ICD-10-CM

## 2019-09-10 LAB — POCT URINALYSIS DIPSTICK
Bilirubin, UA: NEGATIVE
Glucose, UA: NEGATIVE
Ketones, UA: NEGATIVE
Nitrite, UA: NEGATIVE
Protein, UA: NEGATIVE
Spec Grav, UA: >=1.03 — AB (ref 1.010–1.025)
Urobilinogen, UA: 0.2 E.U./dL
pH, UA: 5.5 (ref 5.0–8.0)

## 2019-09-10 MED ORDER — KETOROLAC TROMETHAMINE 60 MG/2ML IM SOLN
60.0000 mg | Freq: Once | INTRAMUSCULAR | Status: AC
  Administered 2019-09-10: 60 mg via INTRAMUSCULAR

## 2019-09-10 NOTE — Progress Notes (Signed)
Subjective:    Patient ID: Angela Oliver, female    DOB: 1966-10-09, 53 y.o.   MRN: [id number]  HPI  Pt is a 53 yo female who presents to the clinic to follow up from ED with right kidney stone. Pt went to ED in severe right flank pain on 08/09/19 and found to have right hydronephrosis and 46mm UPJ calculus kidney stone. 2 days later patient passed stone. Most of pain resolved after the 2 days.  She has picture to share with me to today. She continues to have some right lower quadrant pain and flank pain. She continues to drink coffee and sweet tea.   Pt does wonder if left kidney cyst needs follow up from CT scan ED did.   Pt did need me to resign FMLA paperwork for migraines to give her some prn days.  .. Active Ambulatory Problems    Diagnosis Date Noted  . Allergic rhinitis 06/09/2011  . Asthma 06/09/2011  . GERD 06/09/2011  . 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 Herpes simplex 10/23/2012  . 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/2012  . Trochanteric bursitis of both hips 11/03/2012  . Trochanteric bursitis of right hip 11/22/2013  . Low back pain 02/19/2014  . 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 C7 radiculopathy 12/01/2015  . Obesity (BMI 30.0-34.9) 01/27/2017  . Pain of right breast 07/07/2017  . Family history of breast cancer in mother 07/07/2017  . Abnormal weight gain 07/07/2017  . Carpal tunnel syndrome on both sides 11/29/2017  . Dysfunction of left eustachian tube 02/01/2018  . Stress reaction 01/01/2019  . DDD (degenerative disc disease), lumbar 01/01/2019  . Seasonal allergies 03/18/2019  . Cyst of left kidney 09/10/2019  . Right kidney stone 09/10/2019   Resolved Ambulatory Problems    Diagnosis Date Noted  . RUQ PAIN 06/09/2011  . Migraines 10/23/2012  . Neck muscle spasm 11/03/2012  . Peroneal tendinitis of left lower extremity 12/29/2012  . Left foot pain 11/22/2013  . Foot pain, left 02/19/2014  . Poison ivy  dermatitis 07/04/2014  . Acute bronchitis 09/16/2015  . Paresthesia of both hands 12/01/2015  . Calculus of gallbladder without cholecystitis without obstruction 05/03/2016  . Symptomatic cholelithiasis 06/01/2016  . S/P cholecystectomy 07/16/2016  . Low back sprain 07/28/2016   Past Medical History:  Diagnosis Date  . Anxiety   . Arthritis   . Asthma   . Complication of anesthesia   . Family history of adverse reaction to anesthesia   . GERD (gastroesophageal reflux disease)   . Irregular heart beat         Review of Systems  All other systems reviewed and are negative.      Objective:   Physical Exam Vitals signs reviewed.  Constitutional:      Appearance: She is well-developed.  HENT:     Head: Normocephalic.  Cardiovascular:     Rate and Rhythm: Normal rate.  Pulmonary:     Effort: Pulmonary effort is normal.  Abdominal:     General: Bowel sounds are normal. There is distension.     Palpations: Abdomen is soft.     Tenderness: There is abdominal tenderness in the right lower quadrant. There is right CVA tenderness. There is no left CVA tenderness.     Hernia: No hernia is present.  Skin:    General: Skin is warm.  Neurological:     General: No focal deficit present.     Mental Status: She is alert.  Psychiatric:        Mood and Affect: Mood normal.           Assessment & Plan:  Marland KitchenMarland KitchenMackinley was seen today for abdominal pain.  Diagnoses and all orders for this visit:  Right kidney stone -     COMPLETE METABOLIC PANEL WITH GFR -     CBC with Differential/Platelet  Cyst of left kidney -     COMPLETE METABOLIC PANEL WITH GFR -     CBC with Differential/Platelet  Intractable chronic migraine without aura and with status migrainosus  Flu vaccine need -     Flu Vaccine QUAD 36+ mos IM  Breast cancer screening -     MM 3D SCREEN BREAST BILATERAL  Right flank pain -     COMPLETE METABOLIC PANEL WITH GFR -     CBC with Differential/Platelet -      Urine Culture -     POCT urinalysis dipstick -     ketorolac (TORADOL) injection 60 mg  Right lower quadrant abdominal pain -     COMPLETE METABOLIC PANEL WITH GFR -     CBC with Differential/Platelet   .Marland Kitchen Results for orders placed or performed in visit on 09/10/19  Urine Culture   Specimen: Urine  Result Value Ref Range   MICRO NUMBER: [id number]    SPECIMEN QUALITY: Adequate    Sample Source URINE    STATUS: FINAL    Result: No Growth   POCT urinalysis dipstick  Result Value Ref Range   Color, UA yellow    Clarity, UA clear    Glucose, UA Negative Negative   Bilirubin, UA negative    Ketones, UA negative    Spec Grav, UA >=1.030 (A) 1.010 - 1.025   Blood, UA trace-lysed    pH, UA 5.5 5.0 - 8.0   Protein, UA Negative Negative   Urobilinogen, UA 0.2 0.2 or 1.0 E.U./dL   Nitrite, UA negative    Leukocytes, UA Moderate (2+) (A) Negative   Appearance     Odor     UA leuks and trace blood. No growth on culture either. Could be more kidney stones just not as big. Needs follow up with urology. Pt should have appt that was made through ED. [facility] and [facility] appt. Drink plenty of water. Pt is allergic to sulfa drugs and cannot start flomax.  CBC and CMP ordered in office today.  Toradol IM given in office.   FMLA migraine paperwork resigned.   Discussed left renal cyst. Reassured nothing to be concerned with unless increasing in size. If continues to have pain will need repeat imaging.

## 2019-09-10 NOTE — Patient Instructions (Signed)
Dietary Guidelines to Help Prevent Kidney Stones Kidney stones are deposits of minerals and salts that form inside your kidneys. Your risk of developing kidney stones may be greater depending on your diet, your lifestyle, the medicines you take, and whether you have certain medical conditions. Most people can reduce their chances of developing kidney stones by following the instructions below. Depending on your overall health and the type of kidney stones you tend to develop, your dietitian may give you more specific instructions. What are tips for following this plan? Reading food labels  Choose foods with "no salt added" or "low-salt" labels. Limit your sodium intake to less than 1500 mg per day.  Choose foods with calcium for each meal and snack. Try to eat about 300 mg of calcium at each meal. Foods that contain 200-500 mg of calcium per serving include: ? 8 oz (237 ml) of milk, fortified nondairy milk, and fortified fruit juice. ? 8 oz (237 ml) of kefir, yogurt, and soy yogurt. ? 4 oz (118 ml) of tofu. ? 1 oz of cheese. ? 1 cup (300 g) of dried figs. ? 1 cup (91 g) of cooked broccoli. ? 1-3 oz can of sardines or mackerel.  Most people need 1000 to 1500 mg of calcium each day. Talk to your dietitian about how much calcium is recommended for you. Shopping  Buy plenty of fresh fruits and vegetables. Most people do not need to avoid fruits and vegetables, even if they contain nutrients that may contribute to kidney stones.  When shopping for convenience foods, choose: ? Whole pieces of fruit. ? Premade salads with dressing on the side. ? Low-fat fruit and yogurt smoothies.  Avoid buying frozen meals or prepared deli foods.  Look for foods with live cultures, such as yogurt and kefir. Cooking  Do not add salt to food when cooking. Place a salt shaker on the table and allow each person to add his or her own salt to taste.  Use vegetable protein, such as beans, textured vegetable  protein (TVP), or tofu instead of meat in pasta, casseroles, and soups. Meal planning   Eat less salt, if told by your dietitian. To do this: ? Avoid eating processed or premade food. ? Avoid eating fast food.  Eat less animal protein, including cheese, meat, poultry, or fish, if told by your dietitian. To do this: ? Limit the number of times you have meat, poultry, fish, or cheese each week. Eat a diet free of meat at least 2 days a week. ? Eat only one serving each day of meat, poultry, fish, or seafood. ? When you prepare animal protein, cut pieces into small portion sizes. For most meat and fish, one serving is about the size of one deck of cards.  Eat at least 5 servings of fresh fruits and vegetables each day. To do this: ? Keep fruits and vegetables on hand for snacks. ? Eat 1 piece of fruit or a handful of berries with breakfast. ? Have a salad and fruit at lunch. ? Have two kinds of vegetables at dinner.  Limit foods that are high in a substance called oxalate. These include: ? Spinach. ? Rhubarb. ? Beets. ? Potato chips and french fries. ? Nuts.  If you regularly take a diuretic medicine, make sure to eat at least 1-2 fruits or vegetables high in potassium each day. These include: ? Avocado. ? Banana. ? Orange, prune, carrot, or tomato juice. ? Baked potato. ? Cabbage. ? Beans and split   peas. General instructions   Drink enough fluid to keep your urine clear or pale yellow. This is the most important thing you can do.  Talk to your health care provider and dietitian about taking daily supplements. Depending on your health and the cause of your kidney stones, you may be advised: ? Not to take supplements with vitamin C. ? To take a calcium supplement. ? To take a daily probiotic supplement. ? To take other supplements such as magnesium, fish oil, or vitamin B6.  Take all medicines and supplements as told by your health care provider.  Limit alcohol intake to no  more than 1 drink a day for nonpregnant women and 2 drinks a day for men. One drink equals 12 oz of beer, 5 oz of wine, or 1 oz of hard liquor.  Lose weight if told by your health care provider. Work with your dietitian to find strategies and an eating plan that works best for you. What foods are not recommended? Limit your intake of the following foods, or as told by your dietitian. Talk to your dietitian about specific foods you should avoid based on the type of kidney stones and your overall health. Grains Breads. Bagels. Rolls. Baked goods. Salted crackers. Cereal. Pasta. Vegetables Spinach. Rhubarb. Beets. Canned vegetables. Pickles. Olives. Meats and other protein foods Nuts. Nut butters. Large portions of meat, poultry, or fish. Salted or cured meats. Deli meats. Hot dogs. Sausages. Dairy Cheese. Beverages Regular soft drinks. Regular vegetable juice. Seasonings and other foods Seasoning blends with salt. Salad dressings. Canned soups. Soy sauce. Ketchup. Barbecue sauce. Canned pasta sauce. Casseroles. Pizza. Lasagna. Frozen meals. Potato chips. French fries. Summary  You can reduce your risk of kidney stones by making changes to your diet.  The most important thing you can do is drink enough fluid. You should drink enough fluid to keep your urine clear or pale yellow.  Ask your health care provider or dietitian how much protein from animal sources you should eat each day, and also how much salt and calcium you should have each day. This information is not intended to replace advice given to you by your health care provider. Make sure you discuss any questions you have with your health care provider. Document Released: 03/26/2011 Document Revised: 03/21/2019 Document Reviewed: 11/09/2016 Elsevier Patient Education  2020 [employer]  

## 2019-09-12 LAB — URINE CULTURE
MICRO NUMBER:: 929559
Result:: NO GROWTH
SPECIMEN QUALITY:: ADEQUATE

## 2019-09-12 NOTE — Progress Notes (Signed)
No bacteria growth

## 2019-09-14 ENCOUNTER — Telehealth: Payer: Self-pay | Admitting: Physician Assistant

## 2019-09-25 ENCOUNTER — Ambulatory Visit (INDEPENDENT_AMBULATORY_CARE_PROVIDER_SITE_OTHER): Payer: Federal, State, Local not specified - PPO | Admitting: Physician Assistant

## 2019-09-25 ENCOUNTER — Encounter: Payer: Self-pay | Admitting: Physician Assistant

## 2019-09-25 ENCOUNTER — Other Ambulatory Visit: Payer: Self-pay

## 2019-09-25 VITALS — BP 146/85 | HR 89 | Ht 64.0 in | Wt 201.0 lb

## 2019-09-25 DIAGNOSIS — R05 Cough: Secondary | ICD-10-CM

## 2019-09-25 DIAGNOSIS — R432 Parageusia: Secondary | ICD-10-CM | POA: Diagnosis not present

## 2019-09-25 DIAGNOSIS — R430 Anosmia: Secondary | ICD-10-CM | POA: Diagnosis not present

## 2019-09-25 DIAGNOSIS — R059 Cough, unspecified: Secondary | ICD-10-CM

## 2019-09-25 DIAGNOSIS — G43011 Migraine without aura, intractable, with status migrainosus: Secondary | ICD-10-CM | POA: Diagnosis not present

## 2019-09-25 MED ORDER — DEXAMETHASONE SODIUM PHOSPHATE 10 MG/ML IJ SOLN
8.0000 mg | Freq: Once | INTRAMUSCULAR | Status: AC
  Administered 2019-09-25: 8 mg via INTRAMUSCULAR

## 2019-09-25 MED ORDER — KETOROLAC TROMETHAMINE 60 MG/2ML IM SOLN
60.0000 mg | Freq: Once | INTRAMUSCULAR | Status: AC
  Administered 2019-09-25: 14:00:00 60 mg via INTRAMUSCULAR

## 2019-09-25 MED ORDER — DEXAMETHASONE SODIUM PHOSPHATE 10 MG/ML IJ SOLN: 8.0000 mg | Freq: Once | INTRAMUSCULAR | Status: DC

## 2019-09-25 NOTE — Progress Notes (Signed)
Subjective:    Patient ID: Angela Oliver, female    DOB: [birth year], 53 y.o.   MRN: [id number]  HPI  Pt is a 53 yo female with history of migraines and asthma who presents to the clinic with migraine for the last 4 days. She had not been able to work. She is light and sound sensitive. She has vomited a few times. She is taking excedrin migraine with little relief. She has also started coughing and feels like there asthma is "flaring up". She admits she has some chest tightness. Using albuterol more and does help. She endorses loss of smell and taste. [place] a [place] Pt has not checked temperature. She does feel "achy".   .. Active Ambulatory Problems    Diagnosis Date Noted  . Allergic rhinitis 06/09/2011  . Asthma 06/09/2011  . GERD 06/09/2011  . 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 Herpes simplex 10/23/2012  . 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/2012  . Trochanteric bursitis of both hips 11/03/2012  . Trochanteric bursitis of right hip 11/22/2013  . Low back pain 02/19/2014  . 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 C7 radiculopathy 12/01/2015  . Obesity (BMI 30.0-34.9) 01/27/2017  . Pain of right breast 07/07/2017  . Family history of breast cancer in mother 07/07/2017  . Abnormal weight gain 07/07/2017  . Carpal tunnel syndrome on both sides 11/29/2017  . Dysfunction of left eustachian tube 02/01/2018  . Stress reaction 01/01/2019  . DDD (degenerative disc disease), lumbar 01/01/2019  . Seasonal allergies 03/18/2019  . Cyst of left kidney 09/10/2019  . Right kidney stone 09/10/2019   Resolved Ambulatory Problems    Diagnosis Date Noted  . RUQ PAIN 06/09/2011  . Migraines 10/23/2012  . Neck muscle spasm 11/03/2012  . Peroneal tendinitis of left lower extremity 12/29/2012  . Left foot pain 11/22/2013  . Foot pain, left 02/19/2014  . Poison ivy dermatitis 07/04/2014  . Acute bronchitis 09/16/2015  .  Paresthesia of both hands 12/01/2015  . Calculus of gallbladder without cholecystitis without obstruction 05/03/2016  . Symptomatic cholelithiasis 06/01/2016  . S/P cholecystectomy 07/16/2016  . Low back sprain 07/28/2016   Past Medical History:  Diagnosis Date  . Anxiety   . Arthritis   . Asthma   . Complication of anesthesia   . Family history of adverse reaction to anesthesia   . GERD (gastroesophageal reflux disease)   . Irregular heart beat        Review of Systems See HPI.     Objective:   Physical Exam Vitals signs reviewed.  Constitutional:      Comments: Appeared ill and in pain. Room was dark when entered.   HENT:     Head: Normocephalic.     Right Ear: Tympanic membrane normal.     Left Ear: Tympanic membrane normal.     Nose: Rhinorrhea present.  Eyes:     Extraocular Movements: Extraocular movements intact.     Conjunctiva/sclera: Conjunctivae normal.     Pupils: Pupils are equal, round, and reactive to light.  Cardiovascular:     Rate and Rhythm: Normal rate and regular rhythm.     Pulses: Normal pulses.  Pulmonary:     Effort: Pulmonary effort is normal.     Breath sounds: Normal breath sounds. No wheezing or rhonchi.  Abdominal:     General: There is no distension.     Palpations: Abdomen is soft.     Tenderness: There  is no abdominal tenderness. There is no guarding.  Musculoskeletal:     Right lower leg: No edema.     Left lower leg: No edema.  Lymphadenopathy:     Cervical: No cervical adenopathy.  Skin:    Comments: Flushed cheeks.   Neurological:     General: No focal deficit present.     Mental Status: She is alert and oriented to person, place, and time.  Psychiatric:        Mood and Affect: Mood normal.           Assessment & Plan:  Marland KitchenMarland KitchenMarium was seen today for migraine and asthma.  Diagnoses and all orders for this visit:  Intractable migraine without aura and with status migrainosus -     dexamethasone (DECADRON)  injection 8 mg -     ketorolac (TORADOL) injection 60 mg -     Novel Coronavirus, NAA ([facility])  Cough -     Novel Coronavirus, NAA ([facility])  Loss of smell -     Novel Coronavirus, NAA ([facility])  Loss of taste -     Novel Coronavirus, NAA ([facility])   Very concerned about COVID risk for patient. Office policy is not to see COVID patients in office. I exited room and came back in full PPE. Swabbed for COVID. Pt was written out of work until results are in.   Pt was treated with IM decadron/toradol for migraine.   Discussed symptomatic treatment for cough/body aches. If breathing worsens go to ED. Continue albuterol every 2-4 hours as needed and Qvar daily. Overall lungs sounded good today. robatussin for cough. Decadron should help if this is an asthma flare. Reach out mychart/phone to give updates.   Room was cleaned via COVID protocol and left closed for over 1 hour.

## 2019-09-27 ENCOUNTER — Other Ambulatory Visit: Payer: Self-pay | Admitting: Family Medicine

## 2019-09-27 ENCOUNTER — Encounter: Payer: Self-pay | Admitting: Physician Assistant

## 2019-09-27 LAB — NOVEL CORONAVIRUS, NAA: SARS-CoV-2, NAA: NOT DETECTED

## 2019-09-27 MED ORDER — PREDNISONE 20 MG PO TABS: 40.0000 mg | ORAL_TABLET | Freq: Every day | ORAL | 0 refills | Status: DC

## 2019-09-27 NOTE — Progress Notes (Signed)
COVID negative. How are symptoms?

## 2019-09-28 ENCOUNTER — Telehealth: Payer: Self-pay | Admitting: Neurology

## 2019-09-28 MED ORDER — AZITHROMYCIN 250 MG PO TABS: ORAL_TABLET | ORAL | 0 refills | Status: DC

## 2019-09-28 NOTE — Telephone Encounter (Signed)
Patient made aware.

## 2019-09-28 NOTE — Telephone Encounter (Signed)
Take zpak with prednisone sent yesterday. Not in EMR to go back on Monday. Call Monday morning if not able to do so.

## 2019-09-28 NOTE — Telephone Encounter (Signed)
Patient left vm. She states even though negative Covid results, she definitely feels like she has something. She is feeling terrible. Wants to know if we can call in something to [facility]? Also wants to know if she should remain out of work. Please advise.

## 2019-10-01 NOTE — Telephone Encounter (Signed)
Reviewed attached documents and pended a letter that followed guidelines. Please review and send if you agree with letter

## 2019-10-02 NOTE — Telephone Encounter (Signed)
Patient left vm stating letter not sufficient and need language and letter/numbers cited in letter. Letter re-written.

## 2019-10-04 ENCOUNTER — Ambulatory Visit: Payer: Federal, State, Local not specified - PPO | Admitting: Family Medicine

## 2019-10-04 MED ORDER — PREDNISONE 20 MG PO TABS: ORAL_TABLET | ORAL | 0 refills | Status: DC

## 2019-10-04 NOTE — Telephone Encounter (Signed)
It looks like she has appt with metheney today. Certainly she could have COVId and explains why symptoms are lingering.

## 2019-10-04 NOTE — Telephone Encounter (Addendum)
Toniyah states she is still having wheezing and head congestion. She needs another work note to extend her time until next week. She is not feeling well at all. She is using the Qvar as directed. She finished the prednisone and Z-pak. She states in the past the prednisone taper pack work better.

## 2019-10-04 NOTE — Telephone Encounter (Signed)
She doesn't have an appointment with Dr Madilyn Fireman. It was made before she was triaged. I advised her to go to the ED if she had shortness of breath. She then said she would not go to the ED. She stayed home from work today because she is not better but her work note advised she could return today. She is needing a extended work note and would like more prednisone.

## 2019-10-04 NOTE — Addendum Note (Signed)
Addended by: Donella Stade on: 10/04/2019 02:29 PM   Modules accepted: Orders

## 2019-10-04 NOTE — Telephone Encounter (Signed)
I sent prednisone ok for extended work note. I would treat this like covid without positive test. Needs to be out 10 days from first symptom and fever free for 3 days.

## 2019-10-05 NOTE — Telephone Encounter (Signed)
Patient advised. She will call back on Monday for a return to work date.

## 2019-10-05 NOTE — Telephone Encounter (Signed)
Left message advising of recommendations and for a return call.  

## 2019-10-10 NOTE — Telephone Encounter (Signed)
Patient seen in office

## 2019-10-18 ENCOUNTER — Other Ambulatory Visit: Payer: Self-pay | Admitting: Physician Assistant

## 2019-10-18 DIAGNOSIS — M5412 Radiculopathy, cervical region: Secondary | ICD-10-CM

## 2019-11-02 ENCOUNTER — Other Ambulatory Visit: Payer: Self-pay | Admitting: Physician Assistant

## 2019-11-02 DIAGNOSIS — G43711 Chronic migraine without aura, intractable, with status migrainosus: Secondary | ICD-10-CM

## 2019-11-20 ENCOUNTER — Other Ambulatory Visit: Payer: Self-pay | Admitting: Physician Assistant

## 2019-11-20 NOTE — Telephone Encounter (Signed)
Can we call patient and see if she still taking this medication?

## 2019-11-20 NOTE — Telephone Encounter (Signed)
No longer on medication list. Please advise.  

## 2019-11-20 NOTE — Telephone Encounter (Signed)
Tried to call patient with no answer. Will deny for now and tell patient to contact our office.

## 2019-11-23 ENCOUNTER — Encounter: Payer: Self-pay | Admitting: Physician Assistant

## 2019-11-23 ENCOUNTER — Other Ambulatory Visit: Payer: Self-pay

## 2019-11-23 ENCOUNTER — Ambulatory Visit: Payer: Federal, State, Local not specified - PPO | Admitting: Physician Assistant

## 2019-11-23 VITALS — BP 127/70 | HR 76 | Ht 64.0 in | Wt 205.0 lb

## 2019-11-23 DIAGNOSIS — B001 Herpesviral vesicular dermatitis: Secondary | ICD-10-CM

## 2019-11-23 DIAGNOSIS — M7061 Trochanteric bursitis, right hip: Secondary | ICD-10-CM | POA: Diagnosis not present

## 2019-11-23 DIAGNOSIS — R1013 Epigastric pain: Secondary | ICD-10-CM

## 2019-11-23 DIAGNOSIS — M7062 Trochanteric bursitis, left hip: Secondary | ICD-10-CM | POA: Diagnosis not present

## 2019-11-23 MED ORDER — ALUM & MAG HYDROXIDE-SIMETH 200-200-20 MG/5ML PO SUSP
30.0000 mL | Freq: Once | ORAL | Status: AC
  Administered 2019-11-23: 15:00:00 30 mL via ORAL

## 2019-11-23 MED ORDER — HYOSCYAMINE SULFATE 0.125 MG PO TBDP
0.1250 mg | ORAL_TABLET | Freq: Once | ORAL | Status: AC
  Administered 2019-11-23: 15:00:00 0.125 mg via SUBLINGUAL

## 2019-11-23 MED ORDER — METHYLPREDNISOLONE ACETATE 40 MG/ML IJ SUSP
40.0000 mg | Freq: Once | INTRAMUSCULAR | Status: AC
  Administered 2019-11-23: 16:00:00 40 mg via INTRAMUSCULAR

## 2019-11-23 MED ORDER — ACYCLOVIR 400 MG PO TABS: 400.0000 mg | ORAL_TABLET | Freq: Two times a day (BID) | ORAL | 5 refills | Status: DC

## 2019-11-23 MED ORDER — METHYLPREDNISOLONE ACETATE 40 MG/ML IJ SUSP
40.0000 mg | Freq: Once | INTRAMUSCULAR | Status: AC
  Administered 2019-11-23: 40 mg via INTRAMUSCULAR

## 2019-11-23 MED ORDER — LIDOCAINE VISCOUS HCL 2 % MT SOLN
15.0000 mL | Freq: Once | OROMUCOSAL | Status: AC
  Administered 2019-11-23: 15:00:00 15 mL via OROMUCOSAL

## 2019-11-23 MED ORDER — SUCRALFATE 1 G PO TABS: 1.0000 g | ORAL_TABLET | Freq: Four times a day (QID) | ORAL | 0 refills | Status: DC

## 2019-11-23 NOTE — Patient Instructions (Addendum)
Will get labs and ultrasound.    Gastritis, Adult Gastritis is inflammation of the stomach. There are two kinds of gastritis:  Acute gastritis. This kind develops suddenly.  Chronic gastritis. This kind is much more common and lasts for a long time. Gastritis happens when the lining of the stomach becomes weak or gets damaged. Without treatment, gastritis can lead to stomach bleeding and ulcers. What are the causes? This condition may be caused by:  An infection.  Drinking too much alcohol.  Certain medicines. These include steroids, antibiotics, and some over-the-counter medicines, such as aspirin or ibuprofen.  Having too much acid in the stomach.  A disease of the intestines or stomach.  Stress.  An allergic reaction.  Crohn's disease.  Some cancer treatments (radiation). Sometimes the cause of this condition is not known. What are the signs or symptoms? Symptoms of this condition include:  Pain or a burning sensation in the upper abdomen.  Nausea.  Vomiting.  An uncomfortable feeling of fullness after eating.  Weight loss.  Bad breath.  Blood in your vomit or stools. In some cases, there are no symptoms. How is this diagnosed? This condition may be diagnosed with:  Your medical history and a description of your symptoms.  A physical exam.  Tests. These can include: ? Blood tests. ? Stool tests. ? A test in which a thin, flexible instrument with a light and a camera is passed down the esophagus and into the stomach (upper endoscopy). ? A test in which a sample of tissue is taken for testing (biopsy). How is this treated? This condition may be treated with medicines. The medicines that are used vary depending on the cause of the gastritis:  If the condition is caused by a bacterial infection, you may be given antibiotic medicines.  If the condition is caused by too much acid in the stomach, you may be given medicines called H2 blockers, proton pump  inhibitors, or antacids. Treatment may also involve stopping the use of certain medicines, such as aspirin, ibuprofen, or other NSAIDs. Follow these instructions at home: Medicines  Take over-the-counter and prescription medicines only as told by your health care provider.  If you were prescribed an antibiotic medicine, take it as told by your health care provider. Do not stop taking the antibiotic even if you start to feel better. Eating and drinking   Eat small, frequent meals instead of large meals.  Avoid foods and drinks that make your symptoms worse.  Drink enough fluid to keep your urine pale yellow. Alcohol use  Do not drink alcohol if: ? Your health care provider tells you not to drink. ? You are pregnant, may be pregnant, or are planning to become pregnant.  If you drink alcohol: ? Limit your use to:  0-1 drink a day for women.  0-2 drinks a day for men. ? Be aware of how much alcohol is in your drink. In the [place] one drink equals one 12 oz bottle of beer (355 mL), one 5 oz glass of wine (148 mL), or one 1 oz glass of hard liquor (44 mL). General instructions  Talk with your health care provider about ways to manage stress, such as getting regular exercise or practicing deep breathing, meditation, or yoga.  Do not use any products that contain nicotine or tobacco, such as cigarettes and e-cigarettes. If you need help quitting, ask your health care provider.  Keep all follow-up visits as told by your health care provider. This is important.  Contact a health care provider if:  Your symptoms get worse.  Your symptoms return after treatment. Get help right away if:  You vomit blood or material that looks like coffee grounds.  You have black or dark red stools.  You are unable to keep fluids down.  Your abdominal pain gets worse.  You have a fever.  You do not feel better after one week. Summary  Gastritis is inflammation of the lining of the stomach  that can occur suddenly (acute) or develop slowly over time (chronic).  This condition is diagnosed with a medical history, a physical exam, or tests.  This condition may be treated with medicines to treat infection or medicines to reduce the amount of acid in your stomach.  Follow your health care provider's instructions about taking medicines, making changes to your diet, and knowing when to call for help. This information is not intended to replace advice given to you by your health care provider. Make sure you discuss any questions you have with your health care provider. Document Released: 11/23/2001 Document Revised: 04/18/2018 Document Reviewed: 04/18/2018 Elsevier Patient Education  2020 [employer].

## 2019-11-23 NOTE — Progress Notes (Signed)
Subjective:    Patient ID: Angela Oliver, female    DOB: [birth year], 53 y.o.   MRN: [id number]  HPI  Pt is a 53 yo female with Migraines and GERD who presents to the clinic with upper abdominal pain for one month that comes and goes. Admits to taking celebrex taking. Also taking omeprazole. No bowel changes. No melena or hematochezia. No nausea or vomiting. Pain does radiate from abdomen to back. Food makes symptoms worse. Pt concerned because uncles mother has pancreatic cancer.   Pt has a hx of hip bursitis. Injections helped for over a year. Symptoms started slowly coming back. Restarted exercises. Would like injection again. Admits to walking more.   Needs refill on acylcovir for cold sores.   .. Active Ambulatory Problems    Diagnosis Date Noted  . Allergic rhinitis 06/09/2011  . Asthma 06/09/2011  . GERD 06/09/2011  . 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 Herpes simplex 10/23/2012  . 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/2012  . Trochanteric bursitis of both hips 11/03/2012  . Trochanteric bursitis of right hip 11/22/2013  . Low back pain 02/19/2014  . 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 C7 radiculopathy 12/01/2015  . Obesity (BMI 30.0-34.9) 01/27/2017  . Pain of right breast 07/07/2017  . Family history of breast cancer in mother 07/07/2017  . Abnormal weight gain 07/07/2017  . Carpal tunnel syndrome on both sides 11/29/2017  . Dysfunction of left eustachian tube 02/01/2018  . Stress reaction 01/01/2019  . DDD (degenerative disc disease), lumbar 01/01/2019  . Seasonal allergies 03/18/2019  . Cyst of left kidney 09/10/2019  . Right kidney stone 09/10/2019  . Epigastric pain 11/26/2019   Resolved Ambulatory Problems    Diagnosis Date Noted  . RUQ PAIN 06/09/2011  . Migraines 10/23/2012  . Neck muscle spasm 11/03/2012  . Peroneal tendinitis of left lower extremity 12/29/2012  . Left foot pain 11/22/2013  . Foot pain, left  02/19/2014  . Poison ivy dermatitis 07/04/2014  . Acute bronchitis 09/16/2015  . Paresthesia of both hands 12/01/2015  . Calculus of gallbladder without cholecystitis without obstruction 05/03/2016  . Symptomatic cholelithiasis 06/01/2016  . S/P cholecystectomy 07/16/2016  . Low back sprain 07/28/2016   Past Medical History:  Diagnosis Date  . Anxiety   . Arthritis   . Asthma   . Complication of anesthesia   . Family history of adverse reaction to anesthesia   . GERD (gastroesophageal reflux disease)   . Irregular heart beat      Review of Systems See HPI.     Objective:   Physical Exam Vitals reviewed.  Constitutional:      Appearance: She is well-developed.  HENT:     Head: Normocephalic.  Cardiovascular:     Rate and Rhythm: Normal rate and regular rhythm.  Pulmonary:     Effort: Pulmonary effort is normal.     Breath sounds: Normal breath sounds.  Abdominal:     General: Bowel sounds are normal.     Palpations: Abdomen is soft.     Tenderness: There is abdominal tenderness in the epigastric area. There is no right CVA tenderness or left CVA tenderness.  Musculoskeletal:     Comments: Bilateral tenderness over greater trochanter.   Neurological:     General: No focal deficit present.     Mental Status: She is alert.  Psychiatric:        Mood and Affect: Mood normal.        Behavior:  Behavior normal.           Assessment & Plan:  Marland KitchenMarland KitchenDarryl was seen today for abdominal pain.  Diagnoses and all orders for this visit:  Epigastric pain -     COMPLETE METABOLIC PANEL WITH GFR -     Lipase -     CBC w/Diff -     sucralfate (CARAFATE) 1 g tablet; Take 1 tablet (1 g total) by mouth 4 (four) times daily. -     hyoscyamine (ANASPAZ) disintergrating tablet 0.125 mg -     alum & mag hydroxide-simeth (MAALOX/MYLANTA) 200-200-20 MG/5ML suspension 30 mL -     lidocaine (XYLOCAINE) 2 % viscous mouth solution 15 mL  Trochanteric bursitis of both hips -      methylPREDNISolone acetate (DEPO-MEDROL) injection 40 mg -     methylPREDNISolone acetate (DEPO-MEDROL) injection 40 mg  Recurrent cold sores -     acyclovir (ZOVIRAX) 400 MG tablet; Take 1 tablet (400 mg total) by mouth 2 (two) times daily.   Pt is on PPI. Symptoms sound consistent with gastritis. GI cocktail given in office today. Added carafate. Cmp, cbc, lipase ordered today. Avoid any NSAIDs. GERD diet. Avoid alcohol. Call with any blood in stool or no improvement. Continue carafate for 4 weeks. H.pylori would be inaccurate. Will get u/s.   Bursa Injection Procedure Note  Pre-operative Diagnosis: bilateral Trochanteric bursitis  Post-operative Diagnosis: same  Indications: Diagnosis and treatment of symptomatic bursa  Procedure Details   After a discussion of the risks and benefits with the patient (including the possibility that any manipulation of the bursa could introduce infection, worsening the current situation significantly), verbal consent was obtained for the procedure. The joint was prepped with Betadine and a small wheel of local anesthesia was injected into the subcutaneous tissue. An 18 gauge needle was introduced into the bursa  40mg  of depo medrol injected, bilaterally.. The injection site was cleansed with topical isopropyl alcohol and a dressing was applied.  Complications:  None; patient tolerated the procedure well.

## 2019-11-26 ENCOUNTER — Encounter: Payer: Self-pay | Admitting: Physician Assistant

## 2019-11-26 DIAGNOSIS — R1013 Epigastric pain: Secondary | ICD-10-CM | POA: Insufficient documentation

## 2019-11-27 ENCOUNTER — Encounter: Payer: Self-pay | Admitting: Physician Assistant

## 2019-11-28 ENCOUNTER — Other Ambulatory Visit: Payer: Self-pay

## 2019-11-28 ENCOUNTER — Other Ambulatory Visit: Payer: Federal, State, Local not specified - PPO

## 2019-11-28 ENCOUNTER — Ambulatory Visit (INDEPENDENT_AMBULATORY_CARE_PROVIDER_SITE_OTHER): Payer: Federal, State, Local not specified - PPO

## 2019-11-28 DIAGNOSIS — R1013 Epigastric pain: Secondary | ICD-10-CM | POA: Diagnosis not present

## 2019-11-28 DIAGNOSIS — R1010 Upper abdominal pain, unspecified: Secondary | ICD-10-CM | POA: Diagnosis not present

## 2019-11-28 NOTE — Progress Notes (Signed)
Dody,   Ultrasound was negative for any cause of abdominal pain. Pancreas unremarkable. How are your symptoms?

## 2019-11-29 LAB — COMPLETE METABOLIC PANEL WITH GFR
AG Ratio: 1.7 (calc) (ref 1.0–2.5)
ALT: 21 U/L (ref 6–29)
AST: 16 U/L (ref 10–35)
Albumin: 4.5 g/dL (ref 3.6–5.1)
Alkaline phosphatase (APISO): 71 U/L (ref 37–153)
BUN: 18 mg/dL (ref 7–25)
CO2: 24 mmol/L (ref 20–32)
Calcium: 9.9 mg/dL (ref 8.6–10.4)
Chloride: 104 mmol/L (ref 98–110)
Creat: 0.84 mg/dL (ref 0.50–1.05)
GFR, Est African American: 91 mL/min/{1.73_m2} (ref >=60)
GFR, Est Non African American: 78 mL/min/{1.73_m2} (ref >=60)
Globulin: 2.6 g/dL (calc) (ref 1.9–3.7)
Glucose, Bld: 93 mg/dL (ref 65–99)
Potassium: 4.3 mmol/L (ref 3.5–5.3)
Sodium: 140 mmol/L (ref 135–146)
Total Bilirubin: 0.9 mg/dL (ref 0.2–1.2)
Total Protein: 7.1 g/dL (ref 6.1–8.1)

## 2019-11-29 LAB — CBC WITH DIFFERENTIAL/PLATELET
Absolute Monocytes: 578 cells/uL (ref 200–950)
Basophils Absolute: 63 cells/uL (ref 0–200)
Basophils Relative: 0.6 %
Eosinophils Absolute: 95 cells/uL (ref 15–500)
Eosinophils Relative: 0.9 %
HCT: 41.2 % (ref 35.0–45.0)
Hemoglobin: 14.2 g/dL (ref 11.7–15.5)
Lymphs Abs: 3035 cells/uL (ref 850–3900)
MCH: 30.8 pg (ref 27.0–33.0)
MCHC: 34.5 g/dL (ref 32.0–36.0)
MCV: 89.4 fL (ref 80.0–100.0)
MPV: 10.8 fL (ref 7.5–12.5)
Monocytes Relative: 5.5 %
Neutro Abs: 6731 cells/uL (ref 1500–7800)
Neutrophils Relative %: 64.1 %
Platelets: 282 10*3/uL (ref 140–400)
RBC: 4.61 10*6/uL (ref 3.80–5.10)
RDW: 12.9 % (ref 11.0–15.0)
Total Lymphocyte: 28.9 %
WBC: 10.5 10*3/uL (ref 3.8–10.8)

## 2019-11-29 LAB — LIPASE: Lipase: 16 U/L (ref 7–60)

## 2019-11-29 NOTE — Progress Notes (Signed)
Angela Oliver,   Normal kidney and liver. Normal lipase. Normal hemoglobin. If you are still having pain need to see GI for possible endoscopy.

## 2019-12-12 ENCOUNTER — Other Ambulatory Visit: Payer: Self-pay | Admitting: Physician Assistant

## 2019-12-12 ENCOUNTER — Other Ambulatory Visit: Payer: Self-pay

## 2019-12-12 ENCOUNTER — Ambulatory Visit (INDEPENDENT_AMBULATORY_CARE_PROVIDER_SITE_OTHER): Payer: Federal, State, Local not specified - PPO | Admitting: Physician Assistant

## 2019-12-12 VITALS — BP 152/75 | HR 94 | Ht 64.0 in | Wt 200.0 lb

## 2019-12-12 DIAGNOSIS — K219 Gastro-esophageal reflux disease without esophagitis: Secondary | ICD-10-CM

## 2019-12-12 DIAGNOSIS — R1013 Epigastric pain: Secondary | ICD-10-CM

## 2019-12-12 DIAGNOSIS — Z1211 Encounter for screening for malignant neoplasm of colon: Secondary | ICD-10-CM | POA: Diagnosis not present

## 2019-12-12 DIAGNOSIS — Z791 Long term (current) use of non-steroidal anti-inflammatories (NSAID): Secondary | ICD-10-CM

## 2019-12-12 DIAGNOSIS — G43711 Chronic migraine without aura, intractable, with status migrainosus: Secondary | ICD-10-CM | POA: Diagnosis not present

## 2019-12-12 MED ORDER — KETOROLAC TROMETHAMINE 60 MG/2ML IM SOLN
60.0000 mg | Freq: Once | INTRAMUSCULAR | Status: AC
  Administered 2019-12-12: 15:00:00 60 mg via INTRAMUSCULAR

## 2019-12-12 MED ORDER — AJOVY 225 MG/1.5ML ~~LOC~~ SOAJ: 225.0000 mg | SUBCUTANEOUS | 1 refills | Status: DC

## 2019-12-12 MED ORDER — DEXAMETHASONE SODIUM PHOSPHATE 10 MG/ML IJ SOLN
8.0000 mg | Freq: Once | INTRAMUSCULAR | Status: AC
  Administered 2019-12-12: 15:00:00 8 mg via INTRAMUSCULAR

## 2019-12-12 NOTE — Progress Notes (Signed)
Subjective:    Patient ID: Angela Oliver, female    DOB: 06-03-1966, 53 y.o.   MRN: [id number]  HPI  Pt is a 53 yo female with long hx of migraines who presents to the clinic today with migraine that has lasted for 5 days. She is having 7 to 15 migraine days a month. She is using excedrin migraine for rescue. There are some days where excedrin works better than others. She is taking aimovig and propranolol. She is noticing that migraines increase at the end of the month before next dose of aimovig she feels the need to take aimovig sooner.   Her epigastric pain has improved but still present. She takes omeprazole daily and carafate as needed. She has not had colonoscopy. Denies melena or hematochezia.  Marland Kitchen. Active Ambulatory Problems    Diagnosis Date Noted  . Allergic rhinitis 06/09/2011  . Asthma 06/09/2011  . GERD 06/09/2011  . 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 Herpes simplex 10/23/2012  . 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/2012  . Trochanteric bursitis of both hips 11/03/2012  . Trochanteric bursitis of right hip 11/22/2013  . Low back pain 02/19/2014  . 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 C7 radiculopathy 12/01/2015  . Obesity (BMI 30.0-34.9) 01/27/2017  . Pain of right breast 07/07/2017  . Family history of breast cancer in mother 07/07/2017  . Abnormal weight gain 07/07/2017  . Carpal tunnel syndrome on both sides 11/29/2017  . Dysfunction of left eustachian tube 02/01/2018  . Stress reaction 01/01/2019  . DDD (degenerative disc disease), lumbar 01/01/2019  . Seasonal allergies 03/18/2019  . Cyst of left kidney 09/10/2019  . Right kidney stone 09/10/2019  . Epigastric pain 11/26/2019   Resolved Ambulatory Problems    Diagnosis Date Noted  . RUQ PAIN 06/09/2011  . Migraines 10/23/2012  . Neck muscle spasm 11/03/2012  . Peroneal tendinitis of left lower extremity 12/29/2012  . Left foot pain 11/22/2013  . Foot pain, left  02/19/2014  . Poison ivy dermatitis 07/04/2014  . Acute bronchitis 09/16/2015  . Paresthesia of both hands 12/01/2015  . Calculus of gallbladder without cholecystitis without obstruction 05/03/2016  . Symptomatic cholelithiasis 06/01/2016  . S/P cholecystectomy 07/16/2016  . Low back sprain 07/28/2016   Past Medical History:  Diagnosis Date  . Anxiety   . Arthritis   . Asthma   . Complication of anesthesia   . Family history of adverse reaction to anesthesia   . GERD (gastroesophageal reflux disease)   . Irregular heart beat       Review of Systems See HPI    Objective:   Physical Exam Vitals reviewed.  Constitutional:      Appearance: She is well-developed. She is obese.  Cardiovascular:     Rate and Rhythm: Normal rate and regular rhythm.  Pulmonary:     Effort: Pulmonary effort is normal.     Breath sounds: Normal breath sounds.  Neurological:     Mental Status: She is alert.  Psychiatric:        Mood and Affect: Mood normal.           Assessment & Plan:  Marland KitchenMarland KitchenDaveah was seen today for headache.  Diagnoses and all orders for this visit:  Intractable chronic migraine without aura and with status migrainosus -     Fremanezumab-vfrm (AJOVY) 225 MG/1.5ML SOAJ; Inject 225 mg into the skin every 30 (thirty) days. -     ketorolac (TORADOL) injection 60 mg -  dexamethasone (DECADRON) injection 8 mg  Epigastric pain -     Ambulatory referral to Gastroenterology  Gastroesophageal reflux disease, unspecified whether esophagitis present -     Ambulatory referral to Gastroenterology  Colon cancer screening -     Ambulatory referral to Gastroenterology  NSAID long-term use -     Ambulatory referral to Gastroenterology   Epigastric pain has improved a lot but still remains. U/S normal. Omeprazole daily. carafate as needed. Will refer to GI for further evaluation and EGD consideration. She admits to taking excedrin for migraines. Suspect some NSAID induced  gastritis. Pt cannot take tripans due to work [employer]. She is on propranolol and aimovig. Stop aimovig try ajovy due to longer half life. Could not tolerate topamax. Toradol and decadron given for rescue today.

## 2019-12-16 ENCOUNTER — Encounter: Payer: Self-pay | Admitting: Physician Assistant

## 2020-01-08 DIAGNOSIS — R6881 Early satiety: Secondary | ICD-10-CM | POA: Diagnosis not present

## 2020-01-08 DIAGNOSIS — E669 Obesity, unspecified: Secondary | ICD-10-CM | POA: Diagnosis not present

## 2020-01-08 DIAGNOSIS — K219 Gastro-esophageal reflux disease without esophagitis: Secondary | ICD-10-CM | POA: Diagnosis not present

## 2020-01-08 DIAGNOSIS — R1013 Epigastric pain: Secondary | ICD-10-CM | POA: Diagnosis not present

## 2020-01-14 DIAGNOSIS — Z1211 Encounter for screening for malignant neoplasm of colon: Secondary | ICD-10-CM | POA: Diagnosis not present

## 2020-01-14 DIAGNOSIS — K6389 Other specified diseases of intestine: Secondary | ICD-10-CM | POA: Diagnosis not present

## 2020-01-14 DIAGNOSIS — K648 Other hemorrhoids: Secondary | ICD-10-CM | POA: Diagnosis not present

## 2020-01-14 DIAGNOSIS — R1013 Epigastric pain: Secondary | ICD-10-CM | POA: Diagnosis not present

## 2020-01-14 DIAGNOSIS — K449 Diaphragmatic hernia without obstruction or gangrene: Secondary | ICD-10-CM | POA: Diagnosis not present

## 2020-01-14 DIAGNOSIS — K3189 Other diseases of stomach and duodenum: Secondary | ICD-10-CM | POA: Diagnosis not present

## 2020-01-14 DIAGNOSIS — K635 Polyp of colon: Secondary | ICD-10-CM | POA: Diagnosis not present

## 2020-01-14 LAB — HM COLONOSCOPY

## 2020-01-27 ENCOUNTER — Other Ambulatory Visit: Payer: Self-pay | Admitting: Physician Assistant

## 2020-01-27 DIAGNOSIS — J3089 Other allergic rhinitis: Secondary | ICD-10-CM

## 2020-01-27 DIAGNOSIS — J4530 Mild persistent asthma, uncomplicated: Secondary | ICD-10-CM

## 2020-01-29 ENCOUNTER — Other Ambulatory Visit: Payer: Self-pay | Admitting: Physician Assistant

## 2020-01-29 DIAGNOSIS — K219 Gastro-esophageal reflux disease without esophagitis: Secondary | ICD-10-CM

## 2020-02-17 ENCOUNTER — Encounter: Payer: Self-pay | Admitting: Physician Assistant

## 2020-02-21 ENCOUNTER — Other Ambulatory Visit: Payer: Self-pay

## 2020-02-21 ENCOUNTER — Encounter: Payer: Self-pay | Admitting: Physician Assistant

## 2020-02-21 DIAGNOSIS — G8929 Other chronic pain: Secondary | ICD-10-CM

## 2020-02-21 DIAGNOSIS — M7061 Trochanteric bursitis, right hip: Secondary | ICD-10-CM

## 2020-02-21 DIAGNOSIS — M545 Low back pain, unspecified: Secondary | ICD-10-CM

## 2020-02-22 MED ORDER — CELECOXIB 200 MG PO CAPS: ORAL_CAPSULE | ORAL | 1 refills | Status: DC

## 2020-02-24 ENCOUNTER — Other Ambulatory Visit: Payer: Self-pay | Admitting: Physician Assistant

## 2020-02-24 ENCOUNTER — Encounter: Payer: Self-pay | Admitting: Physician Assistant

## 2020-02-24 DIAGNOSIS — G43711 Chronic migraine without aura, intractable, with status migrainosus: Secondary | ICD-10-CM

## 2020-02-25 MED ORDER — AIMOVIG 140 MG/ML ~~LOC~~ SOAJ: 1.0000 | SUBCUTANEOUS | 11 refills | Status: DC

## 2020-02-26 ENCOUNTER — Encounter: Payer: Self-pay | Admitting: Physician Assistant

## 2020-04-03 ENCOUNTER — Encounter: Payer: Self-pay | Admitting: Nurse Practitioner

## 2020-04-03 ENCOUNTER — Ambulatory Visit (INDEPENDENT_AMBULATORY_CARE_PROVIDER_SITE_OTHER): Payer: Federal, State, Local not specified - PPO | Admitting: Nurse Practitioner

## 2020-04-03 ENCOUNTER — Other Ambulatory Visit: Payer: Self-pay

## 2020-04-03 VITALS — BP 122/78 | HR 65 | Temp 98.1°F | Ht 64.0 in | Wt 205.9 lb

## 2020-04-03 DIAGNOSIS — G4485 Primary stabbing headache: Secondary | ICD-10-CM

## 2020-04-03 DIAGNOSIS — G43711 Chronic migraine without aura, intractable, with status migrainosus: Secondary | ICD-10-CM

## 2020-04-03 DIAGNOSIS — I776 Arteritis, unspecified: Secondary | ICD-10-CM

## 2020-04-03 MED ORDER — DEXAMETHASONE SODIUM PHOSPHATE 10 MG/ML IJ SOLN
10.0000 mg | Freq: Once | INTRAMUSCULAR | Status: AC
  Administered 2020-04-03: 10 mg via INTRAMUSCULAR

## 2020-04-03 MED ORDER — KETOROLAC TROMETHAMINE 60 MG/2ML IM SOLN
60.0000 mg | Freq: Once | INTRAMUSCULAR | Status: AC
  Administered 2020-04-03: 14:00:00 60 mg via INTRAMUSCULAR

## 2020-04-03 NOTE — Patient Instructions (Addendum)
If your headaches become worse or do not improve with medication, then please let us know and we can evaluate further and will consider imaging and blood work.   Watch the rash on your legs for worsening symptoms and let us know if things get worse or do not improve.   Vasculitis  Vasculitis is inflammation of the blood vessels. With vasculitis, the blood vessels can become thick, narrow, scarred, or weak. Enough blood may not be able to flow through them. This can cause damage to the muscles, kidneys, lungs, brain, and other parts of the body. There are many types of vasculitis. The different types may affect different kinds of blood vessels or different areas of the body. Some types last only a short time, while others last a long time. What are the causes? The exact cause of this condition is not known. However, vasculitis can develop when the body's defense system (immune system) attacks its own blood vessels. This attack can be caused by:  An infection.  An immune system disease, such as lupus, rheumatoid arthritis, or scleroderma.  An allergic reaction to a medicine.  A cancer that affects blood cells, such as leukemia or lymphoma. What increases the risk? The following factors may make you more likely to develop this condition:  Being a smoker.  Being under stress.  Having a physical injury. What are the signs or symptoms? Symptoms of this condition depend on the type of vasculitis that you have. Symptoms that are common to all types of vasculitis include:  Fever.  Poor appetite.  Weight loss.  Feeling very tired (fatigue).  Having aches and pains.  Weakness.  Numbness in an area of your body. Symptoms for specific types of vasculitis include:  Skin problems, such as sores, spots, or rashes.  Trouble seeing.  Trouble breathing.  Coughing up blood.  Blood in your urine.  Headaches.  Stomach pain.  Stuffy or bloody nose. How is this diagnosed? This  condition may be diagnosed based on:  Your symptoms.  A physical exam. You may also have tests, including:  Blood tests.  A urine test.  A biopsy of a blood vessel.  A test to measure the electrical signals moving through nerves (nerve conduction study).  Imaging tests, such as: ? X-rays. ? CT scan. ? Ultrasound. ? MRI. ? Angiogram. How is this treated? Treatment for this condition will depend on the type of vasculitis that you have and how severe the symptoms are. Sometimes treatment is not needed. Treatment often includes:  Medicines.  Physical therapy or occupational therapy. This helps strengthen muscles that were weakened by the disease. You will need to see your health care provider while you are being treated. During follow-up visits, your health care provider may:  Perform blood tests and bone density tests.  Check your blood pressure and blood sugar.  Check for side effects of any medicines you are taking. Vasculitis cannot always be cured. Sometimes symptoms go away but the disease does not (the disease goes into remission). If symptoms return, increased treatment may be needed. Follow these instructions at home:  Take over-the-counter and prescription medicines only as told by your health care provider.  Exercise as directed. Talk with your health care provider about what exercises are okay for you to do. Exercises that increase your heart rate (aerobic exercise), such as walking, are usually recommended. Aerobic exercise helps control your blood pressure and prevent bone loss.  Follow a healthy diet. Make sure your diet includes fruits,  vegetables, whole grains, and healthy sources of protein.  Learn as much as you can about vasculitis, and consider joining a support group. ? Talk to other people who have your condition. This may help you cope with the illness. ? Talk with your health care provider if you feel stressed, anxious, or depressed.  Keep all  follow-up visits as told by your health care provider. This is important. Contact a health care provider if:  Your symptoms return or you have new symptoms.  Your fever, fatigue, headache, or weight loss gets worse.  You have signs of infection, such as redness, swelling, tenderness, warmth, or a new fever.  Your pain does not go away, even after you take pain medicine.  Your nose bleeds. Get help right away if:  Your vision gets worse.  You have chest pain or stomach pain.  You have trouble breathing.  One side of your face or body suddenly becomes weak or numb.  There is blood in your urine. Summary  Vasculitis is inflammation of the blood vessels that may cause them to become thick, narrow, scarred, or weak. Enough blood may not be able to flow through them. This can cause damage throughout your body.  The exact cause of this condition is not known. However, vasculitis can develop when the body's immune system attacks its own blood vessels. This attack may be caused by an infection, an immune system disease, an allergic reaction to a medicine, or a cancer that affects blood cells, such as leukemia or lymphoma.  Vasculitis cannot always be cured. Sometimes symptoms go away but the disease does not (the disease goes into remission). If symptoms return, increased treatment may be needed. This information is not intended to replace advice given to you by your health care provider. Make sure you discuss any questions you have with your health care provider. Document Revised: 12/30/2017 Document Reviewed: 12/20/2017 Elsevier Patient Education  2020 [employer].
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
## 2020-04-30 ENCOUNTER — Other Ambulatory Visit: Payer: Self-pay | Admitting: Physician Assistant

## 2020-04-30 DIAGNOSIS — M5412 Radiculopathy, cervical region: Secondary | ICD-10-CM

## 2020-05-20 ENCOUNTER — Other Ambulatory Visit: Payer: Self-pay

## 2020-05-20 ENCOUNTER — Ambulatory Visit (INDEPENDENT_AMBULATORY_CARE_PROVIDER_SITE_OTHER): Payer: Federal, State, Local not specified - PPO | Admitting: Sports Medicine

## 2020-05-20 DIAGNOSIS — G5603 Carpal tunnel syndrome, bilateral upper limbs: Secondary | ICD-10-CM

## 2020-05-20 DIAGNOSIS — M5136 Other intervertebral disc degeneration, lumbar region: Secondary | ICD-10-CM | POA: Diagnosis not present

## 2020-05-20 DIAGNOSIS — M51369 Other intervertebral disc degeneration, lumbar region without mention of lumbar back pain or lower extremity pain: Secondary | ICD-10-CM

## 2020-05-20 MED ORDER — HYDROCODONE-ACETAMINOPHEN 5-325 MG PO TABS: 1.0000 | ORAL_TABLET | Freq: Three times a day (TID) | ORAL | 0 refills | Status: DC | PRN

## 2020-05-20 MED ORDER — PREDNISONE 50 MG PO TABS: ORAL_TABLET | ORAL | 0 refills | Status: DC

## 2020-05-20 NOTE — Assessment & Plan Note (Signed)
Increasing numbness and tingling in both hands, worse with abduction of the shoulders. Continue night splinting for the next month, if no better we will consider median nerve Hydro dissection.

## 2020-05-20 NOTE — Progress Notes (Signed)
    Procedures performed today:    None.  Independent interpretation of notes and tests performed by another provider:   CT scan personally reviewed, there is a large L3-L4 disc protrusion.  Brief History, Exam, Impression, and Recommendations:    Lumbar degenerative disc disease Axial discogenic back pain, on review of a CT scan for renal stones I did see a fairly large L3-L4 disc protrusion. We are can start conservatively with 5 days of prednisone, hydrocodone, formal physical therapy, return to see me in 4 weeks, MRI for interventional planning if no better  Carpal tunnel syndrome on both sides Increasing numbness and tingling in both hands, worse with abduction of the shoulders. Continue night splinting for the next month, if no better we will consider median nerve Hydro dissection.    ___________________________________________ [name]. Benjamin Stain, M.D., ABFM., CAQSM. Primary Care and Sports Medicine [facility]  Adjunct Instructor of Family Medicine  [employer]

## 2020-05-20 NOTE — Assessment & Plan Note (Signed)
Axial discogenic back pain, on review of a CT scan for renal stones I did see a fairly large L3-L4 disc protrusion. We are can start conservatively with 5 days of prednisone, hydrocodone, formal physical therapy, return to see me in 4 weeks, MRI for interventional planning if no better

## 2020-05-22 ENCOUNTER — Encounter: Payer: Self-pay | Admitting: Rehabilitative and Restorative Service Providers"

## 2020-05-22 ENCOUNTER — Ambulatory Visit (INDEPENDENT_AMBULATORY_CARE_PROVIDER_SITE_OTHER): Payer: Federal, State, Local not specified - PPO | Admitting: Rehabilitative and Restorative Service Providers"

## 2020-05-22 ENCOUNTER — Other Ambulatory Visit: Payer: Self-pay

## 2020-05-22 DIAGNOSIS — R29898 Other symptoms and signs involving the musculoskeletal system: Secondary | ICD-10-CM | POA: Diagnosis not present

## 2020-05-22 DIAGNOSIS — M2560 Stiffness of unspecified joint, not elsewhere classified: Secondary | ICD-10-CM | POA: Diagnosis not present

## 2020-05-22 DIAGNOSIS — M5136 Other intervertebral disc degeneration, lumbar region: Secondary | ICD-10-CM | POA: Diagnosis not present

## 2020-05-22 NOTE — Therapy (Signed)
[facility] [place] [address] [place], [place], [address] Phone: [phone]   Fax:  [phone]  Physical Therapy Evaluation  Patient Details  Name: Angela Oliver MRN: [id number] Date of Birth: 1966/06/03 Referring Provider (PT): Dr Ilsa Iha    Encounter Date: 05/22/2020   PT End of Session - 05/22/20 1754    Visit Number 1    Number of Visits 12    Date for PT Re-Evaluation 07/03/20    PT Start Time 1658    PT Stop Time 1752    PT Time Calculation (min) 54 min    Activity Tolerance Patient tolerated treatment well           Past Medical History:  Diagnosis Date  . Anxiety   . Arthritis   . Asthma   . Complication of anesthesia    slow to awaken  . Family history of adverse reaction to anesthesia    mother slow to awaken  . GERD (gastroesophageal reflux disease)   . Herpes simplex   . Irregular heart beat    in the past- no palpations- "skipped a beat"  . Migraines   . Seasonal allergies     Past Surgical History:  Procedure Laterality Date  . APPENDECTOMY    . CHOLECYSTECTOMY  06/01/2016   laproscopic   . CHOLECYSTECTOMY N/A 06/01/2016   Procedure: LAPAROSCOPIC CHOLECYSTECTOMY WITH INTRAOPERATIVE CHOLANGIOGRAM, REMOVAL PERITONEAL NODULE ADJACENT TO SIGMOID COLON;  Surgeon: Avel Peace, MD;  Location: [facility] OR;  Service: General;  Laterality: N/A;  . COLONOSCOPY     age 3  . OVARIAN CYST REMOVAL Left    part of ovary- cyst hadf ruptured    There were no vitals filed for this visit.    Subjective Assessment - 05/22/20 1702    Subjective Patient reports that she has had LBP which has been worsening over the past two months. She fell 12/19 and was treated with improvement. Now symptoms are worsening with no known injury.    Pertinent History injuries to bilat knees Rt > Lt; history of LBP over the past 4-5 years with no known injury; arthritis; kidney stones; migraines    Diagnostic tests degenerative  changes through the lumbar spine    Patient Stated Goals try to get some mobility back and strengthen muscules in the back    Currently in Pain? Yes    Pain Score 8     Pain Location Back    Pain Orientation Lower;Right;Left    Pain Descriptors / Indicators Sharp    Pain Type Acute pain;Chronic pain    Pain Onset More than a month ago    Pain Frequency Constant    Aggravating Factors  work; certain ways that she moves; reaching or bending forward; bending down to pick something up; cleaning when sitting on toilet; dressing    Pain Relieving Factors TENS unit; ice; heat              [facility] PT Assessment - 05/22/20 0001      Assessment   Medical Diagnosis Lumbar DDD    Referring Provider (PT) Dr Ilsa Iha     Onset Date/Surgical Date 04/12/20    Hand Dominance Right    Next MD Visit 06/23/20    Prior Therapy yes here for LBP and knee pain       Precautions   Precautions None      Restrictions   Weight Bearing Restrictions No      Balance Screen   Has  the patient fallen in the past 6 months No    Has the patient had a decrease in activity level because of a fear of falling?  No    Is the patient reluctant to leave their home because of a fear of falling?  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; cleaning; sedentary       Observation/Other Assessments   Focus on Therapeutic Outcomes (FOTO)  54% limitation       Sensation   Additional Comments intermittent tingling and numbness in hands/fingers       Posture/Postural Control   Posture Comments forward  posture; flexed forward ar hips       AROM   Lumbar Flexion 55% pain in LB    Lumbar Extension 30% no pain     Lumbar - Right Side Bend 80% pinching Rt LB    Lumbar - Left Side Bend 70% pinching Lt LB     Lumbar - Right Rotation 30% tight no pain     Lumbar - Left Rotation 30% tight no pain       Strength   Overall Strength Comments LE strength WNL's bilat with resistive testing hip flex/ext      Flexibility   Hamstrings tight end ranges    Quadriceps tight end range Lt > Rt     ITB tight end range Lt > Rt     Piriformis tight Lt > Rt       Palpation   Spinal mobility hypomobile and lots of guarding with PA mobs the lumbar spine.     Palpation comment muscular tenderness to palpation through bilat posterior hips - piriformis/hip abductors; psoas       Special Tests   Other special tests (-) SLR; [place]       Transfers   Comments difficulty with all traisfers and transitional movements       Ambulation/Gait   Gait Comments wide based gait with LE's in ER                       Objective measurements completed on examination: See above findings.       [facility] Adult PT Treatment/Exercise - 05/22/20 0001      Self-Care   Self-Care --   initiated back care education/suggested sleeping in bed      Lumbar Exercises: Stretches   Standing Extension 3 reps   2-3 sec hold through available range    Prone on Elbows Stretch 1 rep;30 seconds    Press Ups 5 reps   2-3 sec hold through available range    ITB Stretch Right;Left;2 reps;30 seconds   supine with strap    Piriformis Stretch Right;Left;2 reps;30 seconds   travell supine with strap                  PT Education - 05/22/20 1741    Education Details HEP    Person(s) Educated Patient    Methods Explanation;Demonstration;Tactile  cues;Verbal cues;Handout    Comprehension Verbalized understanding;Returned demonstration;Verbal cues required;Tactile cues required               PT Long Term Goals -  05/22/20 1758      PT LONG TERM GOAL #1   Time 6    Period Weeks    Status New    Target Date 07/03/20      PT LONG TERM GOAL #2   Title improve FOTO =/< 40% limited    Time 6    Period Weeks    Status New    Target Date 07/03/20      PT LONG TERM GOAL #3   Title report =/> 50% reduction of pain in her back with functional activities and work    Time 6    Period Weeks    Status New    Target Date 07/03/20      PT LONG TERM GOAL #4   Title patient reports ability to sleep in her bed for 3-4 hours with minimal to no increase in pain    Time 6    Period Weeks    Status New    Target Date 07/03/20      PT LONG TERM GOAL #5   Title increase hip mobilty and lumbar mobilty/ROM allowing patient to perform functional transfers and reaching with minimal increase in pain    Time 6    Period Weeks    Status New    Target Date 07/03/20                  Plan - 05/22/20 1802    Clinical Impression Statement Patient presents with recurrent LBP. She has limited trunk and LE mobility and ROM; difficulty with all transfers and transitioinal movements; pain with ADL's and functional activities. She has a sedentary lifestyle. Work involves standing 8 hours/day. She sleeps in her recliner. Patient will benefit from PT to address problems identified.    Personal Factors and Comorbidities Age;Past/Current Experience;Comorbidity 1    Comorbidities obesity    Examination-Activity Limitations Locomotion Level;Transfers;Bend;Sit;Stand;Toileting;Lift;Hygiene/Grooming;Dressing;Continence;Bed Mobility    Examination-Participation Restrictions Cleaning;Laundry    Stability/Clinical Decision Making Evolving/Moderate complexity    Clinical Decision Making Moderate    Rehab Potential Good    PT Frequency 2x / week    PT Duration 6 weeks    PT Treatment/Interventions Patient/family education;ADLs/Self Care Home Management;Cryotherapy;Electrical Stimulation;Iontophoresis 4mg /ml  Dexamethasone;Moist Heat;Ultrasound;Therapeutic activities;Therapeutic exercise;Balance training;Neuromuscular re-education;Manual techniques;Dry needling           Patient will benefit from skilled therapeutic intervention in order to improve the following deficits and impairments:  Pain, Obesity, Increased fascial restricitons, Decreased range of motion, Increased muscle spasms, Hypomobility, Decreased mobility, Decreased strength, Postural dysfunction, Improper body mechanics, Impaired flexibility  Visit Diagnosis: DDD (degenerative disc disease), lumbar - Plan: PT plan of care cert/re-cert  Joint stiffness of spine - Plan: PT plan of care cert/re-cert  Other symptoms and signs involving the musculoskeletal system - Plan: PT plan of care cert/re-cert     Problem List Patient Active Problem List   Diagnosis Date Noted  . Epigastric pain 11/26/2019  . Cyst of left kidney 09/10/2019  . Right kidney stone 09/10/2019  . Seasonal allergies 03/18/2019  . Stress reaction 01/01/2019  . DDD (degenerative disc disease), lumbar 01/01/2019  . Dysfunction of left eustachian tube 02/01/2018  . Carpal tunnel syndrome on both sides 11/29/2017  . Pain of right breast 07/07/2017  . Family history of breast cancer in mother 07/07/2017  .  Abnormal weight gain 07/07/2017  . Obesity (BMI 30.0-34.9) 01/27/2017  . C7 radiculopathy 12/01/2015  . 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 Lumbar degenerative disc disease 02/19/2014  . Trochanteric bursitis of right hip 11/22/2013  . 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/2012  . Trochanteric bursitis of both hips 11/03/2012  . 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 Herpes simplex 10/23/2012  . Allergic rhinitis 06/09/2011  . Asthma 06/09/2011  . GERD 06/09/2011    Sacred Roa Rober Minion PT, MPH  05/22/2020, 6:11 PM  [facility] [address] [place],  [place], [address] Phone: [phone]   Fax:  [phone]  Name: Angela Oliver MRN: [id number] Date of Birth: 22-Mar-1966

## 2020-05-22 NOTE — Patient Instructions (Addendum)
Trunk Extension    Standing, place back of open hands on low back. Straighten spine then arch the back and move shoulders back. Repeat ___2-3_ times per session. Do __several times per day   Access Code: [email or web address]: [email or web address]: 06/10/2021Prepared by: Shonia Skilling HoltExercises  Prone Press Up - 2 x daily - 7 x weekly - 1 sets - 10 reps - 2 sec hold  Prone Press Up on Elbows - 2 x daily - 7 x weekly - 1 sets - 3 reps - 30-60 sec hold  Supine Piriformis Stretch with Leg Straight - 2 x daily - 7 x weekly - 3 reps - 1 sets - 30 sec hold  Supine ITB Stretch with Strap - 2 x daily - 7 x weekly - 3 reps - 1 sets - 30 sec hold

## 2020-05-27 ENCOUNTER — Telehealth: Payer: Self-pay | Admitting: Physician Assistant

## 2020-05-27 NOTE — Telephone Encounter (Signed)
I can see her in the morning worked in after 8 so imaging open if needed.

## 2020-05-27 NOTE — Telephone Encounter (Signed)
Patient wanted to know if they can be worked in for a possible hernia. She thinks she needs a scan done. Please advise.

## 2020-05-27 NOTE — Telephone Encounter (Signed)
Appointment has been made. No further questions at this time.  

## 2020-05-28 ENCOUNTER — Ambulatory Visit (INDEPENDENT_AMBULATORY_CARE_PROVIDER_SITE_OTHER): Payer: Federal, State, Local not specified - PPO | Admitting: Physician Assistant

## 2020-05-28 ENCOUNTER — Other Ambulatory Visit: Payer: Self-pay

## 2020-05-28 ENCOUNTER — Encounter: Payer: Self-pay | Admitting: Physician Assistant

## 2020-05-28 ENCOUNTER — Ambulatory Visit (INDEPENDENT_AMBULATORY_CARE_PROVIDER_SITE_OTHER): Payer: Federal, State, Local not specified - PPO

## 2020-05-28 VITALS — BP 141/72 | HR 90 | Ht 64.0 in | Wt 206.0 lb

## 2020-05-28 DIAGNOSIS — R110 Nausea: Secondary | ICD-10-CM | POA: Diagnosis not present

## 2020-05-28 DIAGNOSIS — R1012 Left upper quadrant pain: Secondary | ICD-10-CM | POA: Diagnosis not present

## 2020-05-28 DIAGNOSIS — N3281 Overactive bladder: Secondary | ICD-10-CM | POA: Diagnosis not present

## 2020-05-28 DIAGNOSIS — R140 Abdominal distension (gaseous): Secondary | ICD-10-CM | POA: Diagnosis not present

## 2020-05-28 DIAGNOSIS — R1013 Epigastric pain: Secondary | ICD-10-CM | POA: Diagnosis not present

## 2020-05-28 MED ORDER — IOHEXOL 300 MG/ML  SOLN
100.0000 mL | Freq: Once | INTRAMUSCULAR | Status: AC | PRN
  Administered 2020-05-28: 100 mL via INTRAVENOUS

## 2020-05-28 MED ORDER — OXYBUTYNIN CHLORIDE ER 5 MG PO TB24: 5.0000 mg | ORAL_TABLET | Freq: Every day | ORAL | 2 refills | Status: DC

## 2020-05-28 NOTE — Progress Notes (Signed)
Subjective:    Patient ID: Angela Oliver, female    DOB: 08-18-1966, 54 y.o.   MRN: [id number]  HPI  Patient is a 54 year old female with GERD and hiatal hernia who presents to the clinic with worsening epigastric pain.  Her pain has been worsening for the last 3 weeks.  She feels a dull ache and constant pull.  She is very nauseous but not vomiting.  She is unable to eat very large portions.  She has noticed her bowels are starting to be more constipated.  She has never had a problem with constipation.  She did have a bowel movement last night that was loose.  She had an endoscopy in February that showed hiatal hernia.  She wonders if this hiatal hernia has worsened.  Patient denies any melena or hematochezia.  Patient has stayed on her GERD medications and denies any reflux.  Patient denies any pain that radiates to back.  Patient does mention urinary leakage for the past 3 months.  Seems to be getting worse and worse.  She is wearing a pad.  She would like something to help make better.  She endorses stress and urge incontinence. .. Active Ambulatory Problems    Diagnosis Date Noted  . Allergic rhinitis 06/09/2011  . Asthma 06/09/2011  . GERD 06/09/2011  . 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 Herpes simplex 10/23/2012  . 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/2012  . Trochanteric bursitis of both hips 11/03/2012  . Trochanteric bursitis of right hip 11/22/2013  . Lumbar degenerative disc disease 02/19/2014  . 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 C7 radiculopathy 12/01/2015  . Obesity (BMI 30.0-34.9) 01/27/2017  . Pain of right breast 07/07/2017  . Family history of breast cancer in mother 07/07/2017  . Abnormal weight gain 07/07/2017  . Carpal tunnel syndrome on both sides 11/29/2017  . Dysfunction of left eustachian tube 02/01/2018  . Stress reaction 01/01/2019  . DDD (degenerative disc disease), lumbar 01/01/2019  . Seasonal allergies  03/18/2019  . Cyst of left kidney 09/10/2019  . Right kidney stone 09/10/2019  . Epigastric pain 11/26/2019  . OAB (overactive bladder) 05/28/2020  . Left upper quadrant pain 05/28/2020  . Abdominal distension (gaseous) 05/28/2020   Resolved Ambulatory Problems    Diagnosis Date Noted  . RUQ PAIN 06/09/2011  . Migraines 10/23/2012  . Neck muscle spasm 11/03/2012  . Peroneal tendinitis of left lower extremity 12/29/2012  . Left foot pain 11/22/2013  . Foot pain, left 02/19/2014  . Poison ivy dermatitis 07/04/2014  . Acute bronchitis 09/16/2015  . Paresthesia of both hands 12/01/2015  . Calculus of gallbladder without cholecystitis without obstruction 05/03/2016  . Symptomatic cholelithiasis 06/01/2016  . S/P cholecystectomy 07/16/2016  . Low back sprain 07/28/2016   Past Medical History:  Diagnosis Date  . Anxiety   . Arthritis   . Asthma   . Complication of anesthesia   . Family history of adverse reaction to anesthesia   . GERD (gastroesophageal reflux disease)   . Irregular heart beat       Review of Systems See HPI.     Objective:   Physical Exam Vitals reviewed.  Constitutional:      Appearance: Normal appearance. She is obese.  Cardiovascular:     Rate and Rhythm: Normal rate and regular rhythm.     Pulses: Normal pulses.     Heart sounds: Normal heart sounds.  Pulmonary:     Effort: Pulmonary effort is normal.  Breath sounds: Normal breath sounds.  Abdominal:     General: There is distension.     Tenderness: There is abdominal tenderness. There is no right CVA tenderness, left CVA tenderness or guarding.     Comments: More tenderness over epigastric and left upper quadrant to palpation.  Fullness of abdomen.   Neurological:     General: No focal deficit present.     Mental Status: She is alert and oriented to person, place, and time.  Psychiatric:        Mood and Affect: Mood normal.           Assessment & Plan:  Marland KitchenMarland KitchenShelda was seen today  for hiatal hernia.  Diagnoses and all orders for this visit:  Left upper quadrant pain -     CT Abdomen Pelvis W Contrast -     COMPLETE METABOLIC PANEL WITH GFR -     Lipase -     CBC  Abdominal distension (gaseous) -     CT Abdomen Pelvis W Contrast -     COMPLETE METABOLIC PANEL WITH GFR -     Lipase -     CBC  Nausea -     CT Abdomen Pelvis W Contrast -     COMPLETE METABOLIC PANEL WITH GFR -     Lipase -     CBC  OAB (overactive bladder) -     oxybutynin (DITROPAN-XL) 5 MG 24 hr tablet; Take 1 tablet (5 mg total) by mouth daily.   Exact etiology of pain unclear.  Per patient she did have an endoscopy that showed a hiatal hernia.  Her pain is in the epigastric area concerned that hernia has worsened.  We will get a CT with contrast of the abdomen.  Will check CBC, lipase, CMP to rule out pancreatitis.  Continue on acid reflux medications.  Continue on antinausea.  Continue to eat small meals.  Symptoms do sound like overactive bladder.  Discussed Kegel maneuvers and exercises to strengthen pelvic floor.  Will start Ditropan daily.  Discussed side effects.  Follow-up in 2 to 3 months or as needed.

## 2020-05-28 NOTE — Patient Instructions (Signed)
Start ditropan.  Go downstairs to schedule imaging.  Overactive Bladder, Adult  Overactive bladder refers to a condition in which a person has a sudden need to pass urine. The person may leak urine if he or she cannot get to the bathroom fast enough (urinary incontinence). A person with this condition may also wake up several times in the night to go to the bathroom. Overactive bladder is associated with poor nerve signals between your bladder and your brain. Your bladder may get the signal to empty before it is full. You may also have very sensitive muscles that make your bladder squeeze too soon. These symptoms might interfere with daily work or social activities. What are the causes? This condition may be associated with or caused by:  Urinary tract infection.  Infection of nearby tissues, such as the prostate.  Prostate enlargement.  Surgery on the uterus or urethra.  Bladder stones, inflammation, or tumors.  Drinking too much caffeine or alcohol.  Certain medicines, especially medicines that get rid of extra fluid in the body (diuretics).  Muscle or nerve weakness, especially from: ? A spinal cord injury. ? Stroke. ? Multiple sclerosis. ? Parkinson's disease.  Diabetes.  Constipation. What increases the risk? You may be at greater risk for overactive bladder if you:  Are an older adult.  Smoke.  Are going through menopause.  Have prostate problems.  Have a neurological disease, such as stroke, dementia, Parkinson's disease, or multiple sclerosis (MS).  Eat or drink things that irritate the bladder. These include alcohol, spicy food, and caffeine.  Are overweight or obese. What are the signs or symptoms? Symptoms of this condition include:  Sudden, strong urge to urinate.  Leaking urine.  Urinating 8 or more times a day.  Waking up to urinate 2 or more times a night. How is this diagnosed? Your health care provider may suspect overactive bladder based on  your symptoms. He or she will diagnose this condition by:  A physical exam and medical history.  Blood or urine tests. You might need bladder or urine tests to help determine what is causing your overactive bladder. You might also need to see a health care provider who specializes in urinary tract problems (urologist). How is this treated? Treatment for overactive bladder depends on the cause of your condition and whether it is mild or severe. You can also make lifestyle changes at home. Options include:  Bladder training. This may include: ? Learning to control the urge to urinate by following a schedule that directs you to urinate at regular intervals (timed voiding). ? Doing Kegel exercises to strengthen your pelvic floor muscles, which support your bladder. Toning these muscles can help you control urination, even if your bladder muscles are overactive.  Special devices. This may include: ? Biofeedback, which uses sensors to help you become aware of your body's signals. ? Electrical stimulation, which uses electrodes placed inside the body (implanted) or outside the body. These electrodes send gentle pulses of electricity to strengthen the nerves or muscles that control the bladder. ? Women may use a plastic device that fits into the vagina and supports the bladder (pessary).  Medicines. ? Antibiotics to treat bladder infection. ? Antispasmodics to stop the bladder from releasing urine at the wrong time. ? Tricyclic antidepressants to relax bladder muscles. ? Injections of botulinum toxin type A directly into the bladder tissue to relax bladder muscles.  Lifestyle changes. This may include: ? Weight loss. Talk to your health care provider about weight  loss methods that would work best for you. ? Diet changes. This may include reducing how much alcohol and caffeine you consume, or drinking fluids at different times of the day. ? Not smoking. Do not use any products that contain nicotine  or tobacco, such as cigarettes and e-cigarettes. If you need help quitting, ask your health care provider.  Surgery. ? A device may be implanted to help manage the nerve signals that control urination. ? An electrode may be implanted to stimulate electrical signals in the bladder. ? A procedure may be done to change the shape of the bladder. This is done only in very severe cases. Follow these instructions at home: Lifestyle  Make any diet or lifestyle changes that are recommended by your health care provider. These may include: ? Drinking less fluid or drinking fluids at different times of the day. ? Cutting down on caffeine or alcohol. ? Doing Kegel exercises. ? Losing weight if needed. ? Eating a healthy and balanced diet to prevent constipation. This may include:  Eating foods that are high in fiber, such as fresh fruits and vegetables, whole grains, and beans.  Limiting foods that are high in fat and processed sugars, such as fried and sweet foods. General instructions  Take over-the-counter and prescription medicines only as told by your health care provider.  If you were prescribed an antibiotic medicine, take it as told by your health care provider. Do not stop taking the antibiotic even if you start to feel better.  Use any implants or pessary as told by your health care provider.  If needed, wear pads to absorb urine leakage.  Keep a journal or log to track how much and when you drink and when you feel the need to urinate. This will help your health care provider monitor your condition.  Keep all follow-up visits as told by your health care provider. This is important. Contact a health care provider if:  You have a fever.  Your symptoms do not get better with treatment.  Your pain and discomfort get worse.  You have more frequent urges to urinate. Get help right away if:  You are not able to control your bladder. Summary  Overactive bladder refers to a  condition in which a person has a sudden need to pass urine.  Several conditions may lead to an overactive bladder.  Treatment for overactive bladder depends on the cause and severity of your condition.  Follow your health care provider's instructions about lifestyle changes, doing Kegel exercises, keeping a journal, and taking medicines. This information is not intended to replace advice given to you by your health care provider. Make sure you discuss any questions you have with your health care provider. Document Revised: 03/22/2019 Document Reviewed: 12/15/2017 Elsevier Patient Education  [place].

## 2020-05-28 NOTE — Progress Notes (Signed)
CT scan showed no hernia not even a hiatal hernia. No cause for abdominal pain. You could have pulled a muscle in the past 3 weeks since symptoms worsened. Warm compresses could help. Perhaps nausea could be coming from the gastritis not controlled. Do you have follow up with GI?

## 2020-05-30 ENCOUNTER — Ambulatory Visit (INDEPENDENT_AMBULATORY_CARE_PROVIDER_SITE_OTHER): Payer: Federal, State, Local not specified - PPO | Admitting: Physical Therapy

## 2020-05-30 ENCOUNTER — Other Ambulatory Visit: Payer: Self-pay

## 2020-05-30 DIAGNOSIS — R29898 Other symptoms and signs involving the musculoskeletal system: Secondary | ICD-10-CM | POA: Diagnosis not present

## 2020-05-30 DIAGNOSIS — M5136 Other intervertebral disc degeneration, lumbar region: Secondary | ICD-10-CM

## 2020-05-30 DIAGNOSIS — M2560 Stiffness of unspecified joint, not elsewhere classified: Secondary | ICD-10-CM | POA: Diagnosis not present

## 2020-05-30 NOTE — Therapy (Signed)
[place], [place], [address] Phone: [phone]   Fax:  [phone]  Physical Therapy Treatment  Patient Details  Name: Angela Oliver MRN: [id number] Date of Birth: Oct 09, 1966 Referring Provider (PT): Dr Romona Curls    Encounter Date: 05/30/2020   PT End of Session - 05/30/20 1446    Visit Number 2    Number of Visits 12    Date for PT Re-Evaluation 07/03/20    PT Start Time 1404    PT Stop Time 1452    PT Time Calculation (min) 48 min    Activity Tolerance Patient tolerated treatment well    Behavior During Therapy [facility] for tasks assessed/performed           Past Medical History:  Diagnosis Date  . Anxiety   . Arthritis   . Asthma   . Complication of anesthesia    slow to awaken  . Family history of adverse reaction to anesthesia    mother slow to awaken  . GERD (gastroesophageal reflux disease)   . Herpes simplex   . Irregular heart beat    in the past- no palpations- "skipped a beat"  . Migraines   . Seasonal allergies     Past Surgical History:  Procedure Laterality Date  . APPENDECTOMY    . CHOLECYSTECTOMY  06/01/2016   laproscopic   . CHOLECYSTECTOMY N/A 06/01/2016   Procedure: LAPAROSCOPIC CHOLECYSTECTOMY WITH INTRAOPERATIVE CHOLANGIOGRAM, REMOVAL PERITONEAL NODULE ADJACENT TO SIGMOID COLON;  Surgeon: Jackolyn Confer, MD;  Location: [place];  Service: General;  Laterality: N/A;  . COLONOSCOPY     age 47  . OVARIAN CYST REMOVAL Left    part of ovary- cyst hadf ruptured    There were no vitals filed for this visit.   Subjective Assessment - 05/30/20 1409    Subjective Pt reports the prone press ups are painful in an area of her abdomen/ribs.  "If I stretch it hurts, so I don't do that".  She had CT to rule out hernia.She continues to sleep in recliner due to acid reflux.    Pertinent History injuries to bilat knees Rt > Lt; history of LBP over the past 4-5 years with no known  injury; arthritis; kidney stones; migraines    Diagnostic tests degenerative changes through the lumbar spine    Patient Stated Goals try to get some mobility back and strengthen muscules in the back    Currently in Pain? Yes    Pain Score 7     Pain Location Back    Pain Orientation Right;Left;Lower    Pain Descriptors / Indicators Aching;Sharp    Pain Onset More than a month ago    Aggravating Factors  moving a certain way, twisting, side bending    Pain Relieving Factors ?              [facility] PT Assessment - 05/30/20 0001      Assessment   Medical Diagnosis Lumbar DDD    Referring Provider (PT) Dr Romona Curls     Onset Date/Surgical Date 04/12/20    Hand Dominance Right    Next MD Visit 06/23/20    Prior Therapy yes here for LBP and knee pain            [facility] Adult PT Treatment/Exercise - 05/30/20 0001      Lumbar Exercises: Stretches   Passive Hamstring Stretch Right;Left;1 rep;20 seconds   supine with strap   Standing Extension 3 reps  2-3 sec hold through available range    Prone on Elbows Stretch 4 reps;10 seconds    Press Ups --   held   ITB Stretch Right;Left;2 reps;20 seconds   supine with strap; cues for form.     Lumbar Exercises: Aerobic   Nustep L5: arms/legs 6 min  (~45 SPM)      Lumbar Exercises: Seated   Sit to Stand 5 reps   TA engaged, from elevated seat (NuStep)   Other Seated Lumbar Exercises TA engaged, with lap press x 5 sec x 10 (sitting at NuStep)      Lumbar Exercises: Supine   Bridge 5 reps    Bridge Limitations minimal tolerance; improved with TA.       Modalities   Modalities Cryotherapy;Electrical Stimulation      Cryotherapy   Number Minutes Cryotherapy 10 Minutes    Cryotherapy Location Lumbar Spine    Type of Cryotherapy Ice pack      Electrical Stimulation   Electrical Stimulation Location bilat lumbar paraspinals    Electrical Stimulation Action IFC    Electrical Stimulation Parameters 10 min, intensity to tolerance     Electrical Stimulation Goals Pain                  PT Education - 05/30/20 1444    Education Details encouraged pt to begin returning to bed for a few hours each night (after meal has settled)    Person(s) Educated Patient    Methods Explanation    Comprehension Verbalized understanding               PT Long Term Goals - 05/22/20 1758      PT LONG TERM GOAL #1   Time 6    Period Weeks    Status New    Target Date 07/03/20      PT LONG TERM GOAL #2   Title improve FOTO =/< 40% limited    Time 6    Period Weeks    Status New    Target Date 07/03/20      PT LONG TERM GOAL #3   Title report =/> 50% reduction of pain in her back with functional activities and work    Time 6    Period Weeks    Status New    Target Date 07/03/20      PT LONG TERM GOAL #4   Title patient reports ability to sleep in her bed for 3-4 hours with minimal to no increase in pain    Time 6    Period Weeks    Status New    Target Date 07/03/20      PT LONG TERM GOAL #5   Title increase hip mobilty and lumbar mobilty/ROM allowing patient to perform functional transfers and reaching with minimal increase in pain    Time 6    Period Weeks    Status New    Target Date 07/03/20                 Plan - 05/30/20 1428    Clinical Impression Statement Pt moves very guarded during exercises and between exercises.  She required some cues on form for stretches to avoid irritating back further.  Pt reported improved tolerance with bridge with TA engaged.  No goals met yet; only 2nd visit.    Personal Factors and Comorbidities Age;Past/Current Experience;Comorbidity 1    Comorbidities obesity    Examination-Activity Limitations Locomotion Level;Transfers;Bend;Sit;Stand;Toileting;Lift;Hygiene/Grooming;Dressing;Continence;Bed Mobility  Examination-Participation Restrictions Cleaning;Laundry    Stability/Clinical Decision Making Evolving/Moderate complexity    Rehab Potential Good    PT  Frequency 2x / week    PT Duration 6 weeks    PT Treatment/Interventions Patient/family education;ADLs/Self Care Home Management;Cryotherapy;Electrical Stimulation;Iontophoresis 10m/ml Dexamethasone;Moist Heat;Ultrasound;Therapeutic activities;Therapeutic exercise;Balance training;Neuromuscular re-education;Manual techniques;Dry needling    PT Next Visit Plan Continue lumbar stabilization and core strengthening.  Back care education.           Patient will benefit from skilled therapeutic intervention in order to improve the following deficits and impairments:  Pain, Obesity, Increased fascial restricitons, Decreased range of motion, Increased muscle spasms, Hypomobility, Decreased mobility, Decreased strength, Postural dysfunction, Improper body mechanics, Impaired flexibility  Visit Diagnosis: DDD (degenerative disc disease), lumbar  Joint stiffness of spine  Other symptoms and signs involving the musculoskeletal system     Problem List Patient Active Problem List   Diagnosis Date Noted  . OAB (overactive bladder) 05/28/2020  . Left upper quadrant pain 05/28/2020  . Abdominal distension (gaseous) 05/28/2020  . Epigastric pain 11/26/2019  . Cyst of left kidney 09/10/2019  . Right kidney stone 09/10/2019  . Seasonal allergies 03/18/2019  . Stress reaction 01/01/2019  . DDD (degenerative disc disease), lumbar 01/01/2019  . Dysfunction of left eustachian tube 02/01/2018  . Carpal tunnel syndrome on both sides 11/29/2017  . Pain of right breast 07/07/2017  . Family history of breast cancer in mother 07/07/2017  . Abnormal weight gain 07/07/2017  . Obesity (BMI 30.0-34.9) 01/27/2017  . C7 radiculopathy 12/01/2015  . 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 Lumbar degenerative disc disease 02/19/2014  . Trochanteric bursitis of right hip 11/22/2013  . 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/2012  . Trochanteric bursitis of both hips  11/03/2012  . 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 Herpes simplex 10/23/2012  . Allergic rhinitis 06/09/2011  . Asthma 06/09/2011  . GERD 06/09/2011   JKerin Perna PTA 05/30/20 2:49 PM  [place] [address]Phone: [phone]  Fax:  [phone] Name: Angela BlinnMRN: [id number] of Birth: [birth year]

## 2020-06-02 ENCOUNTER — Other Ambulatory Visit: Payer: Self-pay

## 2020-06-02 ENCOUNTER — Ambulatory Visit (INDEPENDENT_AMBULATORY_CARE_PROVIDER_SITE_OTHER): Payer: Federal, State, Local not specified - PPO | Admitting: Physical Therapy

## 2020-06-02 DIAGNOSIS — M2560 Stiffness of unspecified joint, not elsewhere classified: Secondary | ICD-10-CM

## 2020-06-02 DIAGNOSIS — M5136 Other intervertebral disc degeneration, lumbar region: Secondary | ICD-10-CM | POA: Diagnosis not present

## 2020-06-02 DIAGNOSIS — R29898 Other symptoms and signs involving the musculoskeletal system: Secondary | ICD-10-CM

## 2020-06-02 NOTE — Addendum Note (Signed)
Addended by: Val Riles on: 06/02/2020 09:31 AM   Modules accepted: Orders

## 2020-06-02 NOTE — Patient Instructions (Signed)
Aquatic Therapy: What to Expect!  Where:  [facility]    [address]    [place], [place]  [address]      [phone]        How to Prepare: . Please make sure you drink 8 ounces of water about one hour prior to your pool session . If you need any assistance with dressing, please have a caregiver with you for the entire appointment. . Please arrive IN YOUR SUIT and 10 minutes prior to your appointment - a health screen will be completed as you enter the Aquatic Center.  . Please make sure to attend to any toileting needs prior to entering the pool. . Once on the pool deck your therapist will ask you to sign the Patient  Consent and Assignment of Benefits form. . Your therapist may take your blood pressure prior to, during and after your session if indicated.  About the pool: 1. Entering the pool your therapist will assist you; there are multiple ways to enter including stairs with railings, a walk in ramp, a roll in chair and a mechanical lift. Your therapist will determine the most appropriate way for you. 2. Water temperature is usually between 86-87 degrees. 3. There may be other swimmers in the pool at the same time.   Contact information:      Appointments:  45 minutes long [facility] Phone [phone] *Please call [facility] if you need to cancel or reschedule an appointment.     Aquatic Therapy:  Keeping Everyone Safe!!!  We are so excited to be back in the pool for therapy and can't wait to see you in the water!! Having said that, we also want to make sure that we keep you, your family member, and everyone one else safe.  We have been in touch with our national aquatic association as well as the CDC to develop safe guidelines for aquatic therapy. First, we want to assure you that the water is one of the safest places to be right now - chlorine and bromine kill the virus and the CDC states the virus  cannot be transmitted in a pool; that's great news for us! Below are specific guidelines from the CDC that we will ask you and your caregiver to follow when coming to the [facility] for therapy. 1. Please shower AT HOME and COME IN YOUR SUIT and cover up to the pool. 2. Please ensure that you are ON TIME and ready (meaning that you are in your suit and ready to enter the pool) for your appointment - all of our pool appointments are full and there is a waiting list. If you are late, we cannot extend your time in the pool.   3. Locker rooms are open but limited to 4 people at a time. At the end of your session, you can either change into dry clothes in the locker room or plan to leave in your suit /cover up and change at home. If you require assistance with this, your caregiver will need to provide that assistance.  4. Follow the Aquatic Center's guidelines to use bathroom/locker room facilities. Signs are posted to provide guidelines for you.  5. The Aquatic Center staff will complete a health screen for you and caregiver as you enter the building.  Once this is completed, PLEASE PROCEED DIRECTLY TO THE POOL DECK. We will be waiting for you there! 6. Masks:  your caregiver   must wear a mask at all times. The CDC recommends that patients and therapists wear their masks until we enter the water and then put them back on as we leave the water. PLEASE BRING A PLASTIC BAG TO STORE YOUR MASK IN WHILE WE ARE IN THE POOL.   Additional safety measures: 1. The Aquatic Center will be practicing social distancing, wearing masks and implementing a stringent cleaning program.  2. We will only be using hard surface equipment and we will be cleaning it between all patients. 3. We will be cleaning hand rails used to enter the pool and chairs that your caregiver might use. 4. We as employees of [employer] must complete a screening every day we work prior to our shift. 5. We are only offering aquatic therapy  to patients who have been determined to be at low risk for the virus - we are happy to share that screen with you if you are interested.  We are excited to be able to offer aquatic therapy again in addition to services at our outpatient clinic - thank you for the opportunity to serve you!     

## 2020-06-02 NOTE — Therapy (Signed)
[facility] [place] [address] [place], [place], [address] Phone: [phone]   Fax:  [phone]  Physical Therapy Treatment  Patient Details  Name: Angela Oliver MRN: [id number] Date of Birth: Aug 18, 1966 Referring Provider (PT): Dr Ilsa Iha    Encounter Date: 06/02/2020   PT End of Session - 06/02/20 1510    Visit Number 3    Number of Visits 12    Date for PT Re-Evaluation 07/03/20    PT Start Time 1434    PT Stop Time 1516    PT Time Calculation (min) 42 min    Activity Tolerance Patient tolerated treatment well    Behavior During Therapy [facility] for tasks assessed/performed           Past Medical History:  Diagnosis Date  . Anxiety   . Arthritis   . Asthma   . Complication of anesthesia    slow to awaken  . Family history of adverse reaction to anesthesia    mother slow to awaken  . GERD (gastroesophageal reflux disease)   . Herpes simplex   . Irregular heart beat    in the past- no palpations- "skipped a beat"  . Migraines   . Seasonal allergies     Past Surgical History:  Procedure Laterality Date  . APPENDECTOMY    . CHOLECYSTECTOMY  06/01/2016   laproscopic   . CHOLECYSTECTOMY N/A 06/01/2016   Procedure: LAPAROSCOPIC CHOLECYSTECTOMY WITH INTRAOPERATIVE CHOLANGIOGRAM, REMOVAL PERITONEAL NODULE ADJACENT TO SIGMOID COLON;  Surgeon: Avel Peace, MD;  [facility];  Service: General;  Laterality: N/A;  . COLONOSCOPY     age 54  . OVARIAN CYST REMOVAL Left    part of ovary- cyst hadf ruptured    There were no vitals filed for this visit.   Subjective Assessment - 06/02/20 1440    Subjective Pt reports she was able to lift her leg to get into the car easier today.  Back feels stiff after standing and lifting luggage for 8 hrs today.    Currently in Pain? Yes    Pain Score 6     Pain Location Back    Pain Orientation Right;Left;Lower    Pain Descriptors / Indicators Aching    Aggravating Factors   side bending, twisting    Pain Relieving Factors ice              [facility] PT Assessment - 06/02/20 0001      Assessment   Medical Diagnosis Lumbar DDD    Referring Provider (PT) Dr Ilsa Iha     Onset Date/Surgical Date 04/12/20    Hand Dominance Right    Next MD Visit 06/23/20    Prior Therapy yes here for LBP and knee pain            [facility] Adult PT Treatment/Exercise - 06/02/20 0001      Lumbar Exercises: Stretches   Passive Hamstring Stretch Right;Left;1 rep;20 seconds   supine with strap   Lower Trunk Rotation 30 seconds   rocking back and forth in tolerable range   Standing Extension 3 reps;10 seconds   2-3 sec hold through available range    Standing Extension Limitations hands on counter behind her.     Prone on Elbows Stretch 10 seconds;3 reps    ITB Stretch Right;Left;2 reps;20 seconds   supine with strap; cues for form.   Piriformis Stretch Right;Left;1 rep;30 seconds      Lumbar Exercises: Aerobic   Nustep L4: arms/legs 6 min  Lumbar Exercises: Standing   Row Both;15 reps    Theraband Level (Row) Level 3 (Green)    Shoulder Extension Strengthening;Both;10 reps;Theraband    Theraband Level (Shoulder Extension) Level 3 (Green)    Other Standing Lumbar Exercises hip abdct x 10, hip ext x 10  each leg with UE support on counter      Lumbar Exercises: Supine   Bridge 5 reps      Cryotherapy   Number Minutes Cryotherapy 10 Minutes    Cryotherapy Location Lumbar Spine    Type of Cryotherapy Ice pack      Electrical Stimulation   Electrical Stimulation Location bilat lumbar paraspinals    Electrical Stimulation Action IFC    Electrical Stimulation Parameters 10 min, intensity to tolerance    Electrical Stimulation Goals Pain                  PT Education - 06/02/20 1508    Education Details aquatic info.    Person(s) Educated Patient    Methods Explanation    Comprehension Verbalized understanding               PT Long Term Goals  - 05/22/20 1758      PT LONG TERM GOAL #1   Time 6    Period Weeks    Status New    Target Date 07/03/20      PT LONG TERM GOAL #2   Title improve FOTO =/< 40% limited    Time 6    Period Weeks    Status New    Target Date 07/03/20      PT LONG TERM GOAL #3   Title report =/> 50% reduction of pain in her back with functional activities and work    Time 6    Period Weeks    Status New    Target Date 07/03/20      PT LONG TERM GOAL #4   Title patient reports ability to sleep in her bed for 3-4 hours with minimal to no increase in pain    Time 6    Period Weeks    Status New    Target Date 07/03/20      PT LONG TERM GOAL #5   Title increase hip mobilty and lumbar mobilty/ROM allowing patient to perform functional transfers and reaching with minimal increase in pain    Time 6    Period Weeks    Status New    Target Date 07/03/20                 Plan - 06/02/20 1453    Clinical Impression Statement Pt reported increased pain in lower back with bridges, regardless of TA engagement.  Limited tolerance for prone on elbows today.  Pt reporting slight improvement in mobility with functional mobility. Pt will benefit from continued PT intervention including aquatic therapy due to decreased exercise tolerance on land. Pt gradually  progressing towards goals.    Personal Factors and Comorbidities Age;Past/Current Experience;Comorbidity 54    Comorbidities obesity    Examination-Activity Limitations Locomotion Level;Transfers;Bend;Sit;Stand;Toileting;Lift;Hygiene/Grooming;Dressing;Continence;Bed Mobility    Examination-Participation Restrictions Cleaning;Laundry    Stability/Clinical Decision Making Evolving/Moderate complexity    Rehab Potential Good    PT Frequency 2x / week    PT Duration 6 weeks    PT Treatment/Interventions Patient/family education;ADLs/Self Care Home Management;Cryotherapy;Electrical Stimulation;Iontophoresis 4mg /ml Dexamethasone;Moist  Heat;Ultrasound;Therapeutic activities;Therapeutic exercise;Balance training;Neuromuscular re-education;Manual techniques;Dry needling    PT Next Visit Plan Continue lumbar stabilization and core strengthening.  Back care education.           Patient will benefit from skilled therapeutic intervention in order to improve the following deficits and impairments:  Pain, Obesity, Increased fascial restricitons, Decreased range of motion, Increased muscle spasms, Hypomobility, Decreased mobility, Decreased strength, Postural dysfunction, Improper body mechanics, Impaired flexibility  Visit Diagnosis: DDD (degenerative disc disease), lumbar  Joint stiffness of spine  Other symptoms and signs involving the musculoskeletal system     Problem List Patient Active Problem List   Diagnosis Date Noted  . OAB (overactive bladder) 05/28/2020  . Left upper quadrant pain 05/28/2020  . Abdominal distension (gaseous) 05/28/2020  . Epigastric pain 11/26/2019  . Cyst of left kidney 09/10/2019  . Right kidney stone 09/10/2019  . Seasonal allergies 03/18/2019  . Stress reaction 01/01/2019  . DDD (degenerative disc disease), lumbar 01/01/2019  . Dysfunction of left eustachian tube 02/01/2018  . Carpal tunnel syndrome on both sides 11/29/2017  . Pain of right breast 07/07/2017  . Family history of breast cancer in mother 07/07/2017  . Abnormal weight gain 07/07/2017  . Obesity (BMI 30.0-34.9) 01/27/2017  . C7 radiculopathy 12/01/2015  . 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 Lumbar degenerative disc disease 02/19/2014  . Trochanteric bursitis of right hip 11/22/2013  . 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/2012  . Trochanteric bursitis of both hips 11/03/2012  . 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 Herpes simplex 10/23/2012  . Allergic rhinitis 06/09/2011  . Asthma 06/09/2011  . GERD 06/09/2011   Mayer Camel, PTA 06/02/20 3:15 PM  [facility] [place] [address] [place], [place], [address] Phone: [phone]   Fax:  [phone]  Name: Jaquita Bessire MRN: [id number] Date of Birth: May 01, 1966

## 2020-06-06 ENCOUNTER — Ambulatory Visit (INDEPENDENT_AMBULATORY_CARE_PROVIDER_SITE_OTHER): Payer: Federal, State, Local not specified - PPO | Admitting: Physical Therapy

## 2020-06-06 ENCOUNTER — Encounter: Payer: Self-pay | Admitting: Physical Therapy

## 2020-06-06 ENCOUNTER — Other Ambulatory Visit: Payer: Self-pay

## 2020-06-06 DIAGNOSIS — M2560 Stiffness of unspecified joint, not elsewhere classified: Secondary | ICD-10-CM

## 2020-06-06 DIAGNOSIS — M5136 Other intervertebral disc degeneration, lumbar region: Secondary | ICD-10-CM | POA: Diagnosis not present

## 2020-06-06 DIAGNOSIS — R29898 Other symptoms and signs involving the musculoskeletal system: Secondary | ICD-10-CM

## 2020-06-06 DIAGNOSIS — M51369 Other intervertebral disc degeneration, lumbar region without mention of lumbar back pain or lower extremity pain: Secondary | ICD-10-CM

## 2020-06-06 NOTE — Therapy (Signed)
[place], [place], [address] Phone: [phone]   Fax:  [phone]  Physical Therapy Treatment  Patient Details  Name: Angela Oliver MRN: [id number] Date of Birth: 05-23-1966 Referring Provider (PT): Dr Romona Curls    Encounter Date: 06/06/2020   PT End of Session - 06/06/20 1930    Visit Number 4    Number of Visits 12    Date for PT Re-Evaluation 07/03/20    PT Start Time 2229    PT Stop Time 1348    PT Time Calculation (min) 45 min    Activity Tolerance Patient tolerated treatment well    Behavior During Therapy [facility] for tasks assessed/performed         Subjective:   Pt reports she slept in bed for first time last night.  She tossed a lot, but was able to remain in the bed and not move to recliner. She reports 4/10 pain in her Rt SI joint.   Past Medical History:  Diagnosis Date  . Anxiety   . Arthritis   . Asthma   . Complication of anesthesia    slow to awaken  . Family history of adverse reaction to anesthesia    mother slow to awaken  . GERD (gastroesophageal reflux disease)   . Herpes simplex   . Irregular heart beat    in the past- no palpations- "skipped a beat"  . Migraines   . Seasonal allergies     Past Surgical History:  Procedure Laterality Date  . APPENDECTOMY    . CHOLECYSTECTOMY  06/01/2016   laproscopic   . CHOLECYSTECTOMY N/A 06/01/2016   Procedure: LAPAROSCOPIC CHOLECYSTECTOMY WITH INTRAOPERATIVE CHOLANGIOGRAM, REMOVAL PERITONEAL NODULE ADJACENT TO SIGMOID COLON;  Surgeon: Jackolyn Confer, MD;  Location: [place];  Service: General;  Laterality: N/A;  . COLONOSCOPY     age 54  . OVARIAN CYST REMOVAL Left    part of ovary- cyst hadf ruptured    Aquatic Therapy at [facility] - pool temp 87 degrees Farenheit.  Pt seen for aquatic therapy today.  Treatment took place in water 3-3.75 ft in depth.  Pt entered/exited the pool via the ramp with single rail and supervision.    Exercises:  * Runner's stretch R/L 30 sec x 2 reps each.  Hamstring stretch with foot on bench (in water) with single UE support x 20 sec x 2 reps each leg; cues for form.  * Sit to/from stand to bench (in water), with cues for controlled descent and core engaged .  *Pt performed gait training in pool forwards with hands on pool noodle for support and added resistance x 25 m. Side stepping with buoyancy cuffs on ankles x 20 meters Rt/ 20 meters Lt, with arms Abdct/add.  * Ai Chi postures (in semi squat position- water to clavicles): Floating, Uplifting, Enclosing x 10 each for core stabilization and balance. (In single high kneel position): Soothing x 10 reps each side.  * Hip ext x 10 with UE on pool edge; cues for form and core engagement.  * Hip flex/ext with buoyancy cuffs on ankles, core engaged x 10 * High knee marching (dynamic) with buoyancy cuffs on ankles 25 m, (with/without reciprocal arm swing under water) * Low back stretch with bilat feet on pool wall, hands holding pool edge x 15 sec x 2 reps.      PT Long Term Goals - 05/22/20 1758      PT LONG TERM GOAL #1  Time 6    Period Weeks    Status New    Target Date 07/03/20      PT LONG TERM GOAL #2   Title improve FOTO =/< 40% limited    Time 6    Period Weeks    Status New    Target Date 07/03/20      PT LONG TERM GOAL #3   Title report =/> 50% reduction of pain in her back with functional activities and work    Time 6    Period Weeks    Status New    Target Date 07/03/20      PT LONG TERM GOAL #4   Title patient reports ability to sleep in her bed for 3-4 hours with minimal to no increase in pain    Time 6    Period Weeks    Status New    Target Date 07/03/20      PT LONG TERM GOAL #5   Title increase hip mobilty and lumbar mobilty/ROM allowing patient to perform functional transfers and reaching with minimal increase in pain    Time 6    Period Weeks    Status New    Target Date 07/03/20                  Plan - 06/06/20 1930    Clinical Impression Statement Pt reported reduction of low back pain when in pool, at times total elimination of pain.  Pt requires buoyancy for support and to offload joints with strengthening exercises. Viscosity of the water is needed for resistance of strengthening; water current perturbations provides challenge to standing balance unsupported, requiring increased core activation.  Pt progressing towards goals.    Personal Factors and Comorbidities Age 54;Past/Current Experience;Comorbidity 1    Comorbidities obesity    Examination-Activity Limitations Locomotion Level;Transfers;Bend;Sit;Stand;Toileting;Lift;Hygiene/Grooming;Dressing;Continence;Bed Mobility    Examination-Participation Restrictions Cleaning;Laundry    Stability/Clinical Decision Making Evolving/Moderate complexity    Rehab Potential Good    PT Frequency 2x / week    PT Duration 6 weeks    PT Treatment/Interventions Patient/family education;ADLs/Self Care Home Management;Cryotherapy;Electrical Stimulation;Iontophoresis 4mg /ml Dexamethasone;Moist Heat;Ultrasound;Therapeutic activities;Therapeutic exercise;Balance training;Neuromuscular re-education;Manual techniques;Dry needling    PT Next Visit Plan Continue lumbar stabilization and core strengthening.  Back care education.           Patient will benefit from skilled therapeutic intervention in order to improve the following deficits and impairments:  Pain, Obesity, Increased fascial restricitons, Decreased range of motion, Increased muscle spasms, Hypomobility, Decreased mobility, Decreased strength, Postural dysfunction, Improper body mechanics, Impaired flexibility  Visit Diagnosis: No diagnosis found.     Problem List Patient Active Problem List   Diagnosis Date Noted  . OAB (overactive bladder) 05/28/2020  . Left upper quadrant pain 05/28/2020  . Abdominal distension (gaseous) 05/28/2020  . Epigastric pain 11/26/2019  .  Cyst of left kidney 09/10/2019  . Right kidney stone 09/10/2019  . Seasonal allergies 03/18/2019  . Stress reaction 01/01/2019  . DDD (degenerative disc disease), lumbar 01/01/2019  . Dysfunction of left eustachian tube 02/01/2018  . Carpal tunnel syndrome on both sides 11/29/2017  . Pain of right breast 07/07/2017  . Family history of breast cancer in mother 07/07/2017  . Abnormal weight gain 07/07/2017  . Obesity (BMI 30.0-34.9) 01/27/2017  . C7 radiculopathy 12/01/2015  . 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 Lumbar degenerative disc disease 02/19/2014  . Trochanteric bursitis of right hip 11/22/2013  . 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/2012  .  Trochanteric bursitis of both hips 11/03/2012  . 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 Herpes simplex 10/23/2012  . Allergic rhinitis 06/09/2011  . Asthma 06/09/2011  . GERD 06/09/2011   Mayer Camel, PTA 06/06/20 5:32 PM   [facility] [address] [place], [place], [address] Phone: [phone]   Fax:  [phone]  Name: Angela Oliver MRN: [id number] Date of Birth: Dec 10, 1966

## 2020-06-11 ENCOUNTER — Other Ambulatory Visit: Payer: Self-pay

## 2020-06-11 ENCOUNTER — Ambulatory Visit (INDEPENDENT_AMBULATORY_CARE_PROVIDER_SITE_OTHER): Payer: Federal, State, Local not specified - PPO | Admitting: Physical Therapy

## 2020-06-11 DIAGNOSIS — M5136 Other intervertebral disc degeneration, lumbar region: Secondary | ICD-10-CM | POA: Diagnosis not present

## 2020-06-11 DIAGNOSIS — M2560 Stiffness of unspecified joint, not elsewhere classified: Secondary | ICD-10-CM

## 2020-06-11 DIAGNOSIS — R29898 Other symptoms and signs involving the musculoskeletal system: Secondary | ICD-10-CM | POA: Diagnosis not present

## 2020-06-11 NOTE — Therapy (Signed)
[place], [place], [address] Phone: [phone]   Fax:  [phone]  Physical Therapy Treatment  Patient Details  Name: Angela Oliver MRN: [id number] Date of Birth: 1966/02/29 Referring Provider (PT): Dr Romona Curls    Encounter Date: 06/11/2020   PT End of Session - 06/11/20 1607    Visit Number 5    Number of Visits 12    Date for PT Re-Evaluation 07/03/20    PT Start Time 7858    PT Stop Time 1603    PT Time Calculation (min) 48 min    Activity Tolerance Patient tolerated treatment well    Behavior During Therapy [facility] for tasks assessed/performed           Past Medical History:  Diagnosis Date  . Anxiety   . Arthritis   . Asthma   . Complication of anesthesia    slow to awaken  . Family history of adverse reaction to anesthesia    mother slow to awaken  . GERD (gastroesophageal reflux disease)   . Herpes simplex   . Irregular heart beat    in the past- no palpations- "skipped a beat"  . Migraines   . Seasonal allergies     Past Surgical History:  Procedure Laterality Date  . APPENDECTOMY    . CHOLECYSTECTOMY  06/01/2016   laproscopic   . CHOLECYSTECTOMY N/A 06/01/2016   Procedure: LAPAROSCOPIC CHOLECYSTECTOMY WITH INTRAOPERATIVE CHOLANGIOGRAM, REMOVAL PERITONEAL NODULE ADJACENT TO SIGMOID COLON;  Surgeon: Jackolyn Confer, MD;  Location: [place];  Service: General;  Laterality: N/A;  . COLONOSCOPY     age 53  . OVARIAN CYST REMOVAL Left    part of ovary- cyst hadf ruptured    There were no vitals filed for this visit.   Subjective Assessment - 06/11/20 1525    Subjective Pt reports her pain is increased today, from standing all day.  She states she had no pain upon waking; back sleeping in her recliner.  Shoulders and neck are sore, possibly from lifting luggage at work.    Currently in Pain? Yes    Pain Score 8     Pain Location Back    Pain Orientation Right;Lower    Pain  Descriptors / Indicators Tightness;Tender;Sharp    Aggravating Factors  prolonged standing    Pain Relieving Factors rest, ice.              [facility] PT Assessment - 06/11/20 0001      Assessment   Medical Diagnosis Lumbar DDD    Referring Provider (PT) Dr Romona Curls     Onset Date/Surgical Date 04/12/20    Hand Dominance Right    Next MD Visit 06/23/20    Prior Therapy yes here for LBP and knee pain       Palpation   SI assessment  Lt sacral torsion with pt in prone.     Palpation comment pt point tender in Lt glute with palpation.              [place] Adult PT Treatment/Exercise - 06/11/20 0001      Lumbar Exercises: Stretches   Passive Hamstring Stretch Right;Left;1 rep;20 seconds   supine with strap   Standing Extension 3 reps;10 seconds   2-3 sec hold through available range    Standing Extension Limitations hands on counter behind her.     Prone on Elbows Stretch 3 reps;10 seconds    Gastroc Stretch Right;Left;2 reps;20 seconds   incline  board (together)   Gastroc Stretch Limitations Soleus stretch x 10 sec each leg       Lumbar Exercises: Aerobic   Nustep L5: legs for 5 min, arms/legs x 1 min       Lumbar Exercises: Supine   Ab Set 5 reps;5 seconds    Pelvic Tilt 3 reps   ant/post to find neutral   Clam 5 reps   with ab set   Clam Limitations (Bent knee fall outs)    Bent Knee Raise 10 reps   with ab set   Bridge 5 reps    Bridge Limitations limited tolerance; cramp in upper abdomen by ribs.       Cryotherapy   Number Minutes Cryotherapy 10 Minutes    Cryotherapy Location Lumbar Spine    Type of Cryotherapy Ice pack      Electrical Stimulation   Electrical Stimulation Location bilat lumbar paraspinals    Electrical Stimulation Action IFC    Electrical Stimulation Parameters 10 min, intensity to tolerance    Electrical Stimulation Goals Pain                       PT Long Term Goals - 05/22/20 1758      PT LONG TERM GOAL #1   Time 6     Period Weeks    Status New    Target Date 07/03/20      PT LONG TERM GOAL #2   Title improve FOTO =/< 40% limited    Time 6    Period Weeks    Status New    Target Date 07/03/20      PT LONG TERM GOAL #3   Title report =/> 50% reduction of pain in her back with functional activities and work    Time 6    Period Weeks    Status New    Target Date 07/03/20      PT LONG TERM GOAL #4   Title patient reports ability to sleep in her bed for 3-4 hours with minimal to no increase in pain    Time 6    Period Weeks    Status New    Target Date 07/03/20      PT LONG TERM GOAL #5   Title increase hip mobilty and lumbar mobilty/ROM allowing patient to perform functional transfers and reaching with minimal increase in pain    Time 6    Period Weeks    Status New    Target Date 07/03/20                 Plan - 06/11/20 1547    Clinical Impression Statement Pt will limited tolerance for supine/ standing exercises; pain with bridge, pelvic tilts and transitional movements.  Pt able to engage core muscles with minimal cues.  Pt reported pain reduction with use of ice and estim at end of session. Encouraged postural changes during work day to decrease LBP.    Personal Factors and Comorbidities Age;Past/Current Experience;Comorbidity 1    Comorbidities obesity    Examination-Activity Limitations Locomotion Level;Transfers;Bend;Sit;Stand;Toileting;Lift;Hygiene/Grooming;Dressing;Continence;Bed Mobility    Examination-Participation Restrictions Cleaning;Laundry    Stability/Clinical Decision Making Evolving/Moderate complexity    Rehab Potential Good    PT Frequency 2x / week    PT Duration 6 weeks    PT Treatment/Interventions Patient/family education;ADLs/Self Care Home Management;Cryotherapy;Electrical Stimulation;Iontophoresis 82m/ml Dexamethasone;Moist Heat;Ultrasound;Therapeutic activities;Therapeutic exercise;Balance training;Neuromuscular re-education;Manual techniques;Dry needling     PT Next Visit Plan Continue lumbar stabilization and  core strengthening.  Back care education. MET correction to pelvis    Consulted and Agree with Plan of Care Patient           Patient will benefit from skilled therapeutic intervention in order to improve the following deficits and impairments:  Pain, Obesity, Increased fascial restricitons, Decreased range of motion, Increased muscle spasms, Hypomobility, Decreased mobility, Decreased strength, Postural dysfunction, Improper body mechanics, Impaired flexibility  Visit Diagnosis: DDD (degenerative disc disease), lumbar  Joint stiffness of spine  Other symptoms and signs involving the musculoskeletal system     Problem List Patient Active Problem List   Diagnosis Date Noted  . OAB (overactive bladder) 05/28/2020  . Left upper quadrant pain 05/28/2020  . Abdominal distension (gaseous) 05/28/2020  . Epigastric pain 11/26/2019  . Cyst of left kidney 09/10/2019  . Right kidney stone 09/10/2019  . Seasonal allergies 03/18/2019  . Stress reaction 01/01/2019  . DDD (degenerative disc disease), lumbar 01/01/2019  . Dysfunction of left eustachian tube 02/01/2018  . Carpal tunnel syndrome on both sides 11/29/2017  . Pain of right breast 07/07/2017  . Family history of breast cancer in mother 07/07/2017  . Abnormal weight gain 07/07/2017  . Obesity (BMI 30.0-34.9) 01/27/2017  . C7 radiculopathy 12/01/2015  . 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 Lumbar degenerative disc disease 02/19/2014  . Trochanteric bursitis of right hip 11/22/2013  . 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/2012  . Trochanteric bursitis of both hips 11/03/2012  . 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 Herpes simplex 10/23/2012  . Allergic rhinitis 06/09/2011  . Asthma 06/09/2011  . GERD 06/09/2011   Kerin Perna, PTA 06/11/20 5:01 PM  [place], [place], [address] Phone: [phone]   Fax:  [phone]  Name: Angela Oliver MRN: [id number] Date of Birth: 1966/01/07

## 2020-06-13 ENCOUNTER — Encounter: Payer: Self-pay | Admitting: Physical Therapy

## 2020-06-13 ENCOUNTER — Ambulatory Visit (INDEPENDENT_AMBULATORY_CARE_PROVIDER_SITE_OTHER): Payer: Federal, State, Local not specified - PPO | Admitting: Physical Therapy

## 2020-06-13 ENCOUNTER — Other Ambulatory Visit: Payer: Self-pay

## 2020-06-13 DIAGNOSIS — M2560 Stiffness of unspecified joint, not elsewhere classified: Secondary | ICD-10-CM

## 2020-06-13 DIAGNOSIS — M5136 Other intervertebral disc degeneration, lumbar region: Secondary | ICD-10-CM

## 2020-06-13 DIAGNOSIS — R29898 Other symptoms and signs involving the musculoskeletal system: Secondary | ICD-10-CM

## 2020-06-13 NOTE — Therapy (Signed)
[place], [place], [address] Phone: [phone]   Fax:  [phone]  Physical Therapy Treatment  Patient Details  Name: Angela Oliver MRN: [id number] Date of Birth: [birth year] Referring Provider (PT): Dr Romona Curls    Encounter Date: 06/13/2020   PT End of Session - 06/13/20 1444    Visit Number 6    Number of Visits 12    Date for PT Re-Evaluation 07/03/20    PT Start Time 1254    PT Stop Time 1340    PT Time Calculation (min) 46 min    Activity Tolerance Patient tolerated treatment well    Behavior During Therapy [facility] for tasks assessed/performed           Past Medical History:  Diagnosis Date  . Anxiety   . Arthritis   . Asthma   . Complication of anesthesia    slow to awaken  . Family history of adverse reaction to anesthesia    mother slow to awaken  . GERD (gastroesophageal reflux disease)   . Herpes simplex   . Irregular heart beat    in the past- no palpations- "skipped a beat"  . Migraines   . Seasonal allergies     Past Surgical History:  Procedure Laterality Date  . APPENDECTOMY    . CHOLECYSTECTOMY  06/01/2016   laproscopic   . CHOLECYSTECTOMY N/A 06/01/2016   Procedure: LAPAROSCOPIC CHOLECYSTECTOMY WITH INTRAOPERATIVE CHOLANGIOGRAM, REMOVAL PERITONEAL NODULE ADJACENT TO SIGMOID COLON;  Surgeon: Jackolyn Confer, MD;  Location: [place];  Service: General;  Laterality: N/A;  . COLONOSCOPY     age 48  . OVARIAN CYST REMOVAL Left    part of ovary- cyst hadf ruptured    There were no vitals filed for this visit.   Subjective Assessment - 06/13/20 1445    Subjective Pt reports she has been off work today, so her back is not as bad.  She has been sleeping in recliner. She reports that her pain in low back is not as sharp in intesity as it was initially.    Currently in Pain? Yes    Pain Score 4     Pain Location Back    Pain Orientation Right;Left;Lower    Pain  Descriptors / Indicators Dull           Aquatic Therapy at [facility]  Pool Temp 86.5 deg F.  Session completed in 3.5-4 ft deep water.   Patient entered the pool with bilat arms rail via ramp independently.   Exercises:   Hamstring stretch with foot on bench in pool, hip hinge forward x 30 sec x 2 reps each leg.  Quad stretch with foot on bench in water x 20 sec each leg.  50 meters high knee marching, 50 meters backward walking, 30 meters side stepping with straight legs Rt/Lt; 15 meters side step with bent knees Rt/Lt   Hip circles CW with knee bent and UE support on wall x 10 each leg, Leg fire hydrants with UE support on wall x 5 each leg.  At pool ledge, UE on wall for support and buoyancy cuffs on ankles:  Hip ext x 10 each leg, Hip abdct x 10 each leg. Chair pose squat with pool noodle arc brought overhead x 10.  Split squat stance with core engaged - floatation dumbbells pushed forward/pulled to chest x 10, then at armpits pushed/pulled to side (slightly submerged), then pushing dumbbells to hips and returning to chest x 10.  Sumo squat with single barbell held with 2 hands submerged at waist x 10.  Mini squat with trunk rotation (limited range) holding dumbbell just out from core x 10.   Lower back stretch with feet on wall, hands holding wall, knees bent.     PT Long Term Goals - 06/13/20 1450      PT LONG TERM GOAL #1   Title I with advanced HEP (01/12/18)     Time 6    Period Weeks    Status On-going      PT LONG TERM GOAL #2   Title improve FOTO =/< 40% limited    Time 6    Period Weeks    Status On-going      PT LONG TERM GOAL #3   Title report =/> 50% reduction of pain in her back with functional activities and work    Time 6    Period Weeks    Status On-going      PT LONG TERM GOAL #4   Title patient reports ability to sleep in her bed for 3-4 hours with minimal to no increase in pain    Time 6    Period Weeks    Status On-going      PT LONG TERM GOAL #5    Title increase hip mobilty and lumbar mobilty/ROM allowing patient to perform functional transfers and reaching with minimal increase in pain    Time 6    Period Weeks    Status On-going                 Plan - 06/13/20 1448    Clinical Impression Statement Pt reported reduction of low back pain to 2/10 when in the water.  She tolerated standing LE and core exercises without increase in pain, only reporting increased tightness across low back. Tightness likely due to increased lordosis with certain postures in water; decreased with low back stretch holding onto wall.Pt requires buoyancy for support and to offload joints with strengthening exercises. Viscosity of the water is needed for resistance of strengthening; water current perturbations provides challenge to standing balance unsupported, requiring increased core activation.  Pt progressing towards goals.    Personal Factors and Comorbidities Age;Past/Current Experience;Comorbidity 1    Comorbidities obesity    Examination-Activity Limitations Locomotion Level;Transfers;Bend;Sit;Stand;Toileting;Lift;Hygiene/Grooming;Dressing;Continence;Bed Mobility    Examination-Participation Restrictions Cleaning;Laundry    Stability/Clinical Decision Making Evolving/Moderate complexity    Rehab Potential Good    PT Frequency 2x / week    PT Duration 6 weeks    PT Treatment/Interventions Patient/family education;ADLs/Self Care Home Management;Cryotherapy;Electrical Stimulation;Iontophoresis 36m/ml Dexamethasone;Moist Heat;Ultrasound;Therapeutic activities;Therapeutic exercise;Balance training;Neuromuscular re-education;Manual techniques;Dry needling    PT Next Visit Plan Continue lumbar stabilization and core strengthening.  Back care education. MET correction to pelvis    Consulted and Agree with Plan of Care Patient           Patient will benefit from skilled therapeutic intervention in order to improve the following deficits and impairments:   Pain, Obesity, Increased fascial restricitons, Decreased range of motion, Increased muscle spasms, Hypomobility, Decreased mobility, Decreased strength, Postural dysfunction, Improper body mechanics, Impaired flexibility  Visit Diagnosis: DDD (degenerative disc disease), lumbar  Joint stiffness of spine  Other symptoms and signs involving the musculoskeletal system     Problem List Patient Active Problem List   Diagnosis Date Noted  . OAB (overactive bladder) 05/28/2020  . Left upper quadrant pain 05/28/2020  . Abdominal distension (gaseous) 05/28/2020  . Epigastric pain 11/26/2019  . Cyst  of left kidney 09/10/2019  . Right kidney stone 09/10/2019  . Seasonal allergies 03/18/2019  . Stress reaction 01/01/2019  . DDD (degenerative disc disease), lumbar 01/01/2019  . Dysfunction of left eustachian tube 02/01/2018  . Carpal tunnel syndrome on both sides 11/29/2017  . Pain of right breast 07/07/2017  . Family history of breast cancer in mother 07/07/2017  . Abnormal weight gain 07/07/2017  . Obesity (BMI 30.0-34.9) 01/27/2017  . C7 radiculopathy 12/01/2015  . 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 Lumbar degenerative disc disease 02/19/2014  . Trochanteric bursitis of right hip 11/22/2013  . 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/2012  . Trochanteric bursitis of both hips 11/03/2012  . 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 Herpes simplex 10/23/2012  . Allergic rhinitis 06/09/2011  . Asthma 06/09/2011  . GERD 06/09/2011   Kerin Perna, PTA 06/13/20 3:07 PM  [place], [place], [address] Phone: [phone]   Fax:  [phone]  Name: Angela Oliver MRN: [id number] Date of Birth: November 03, 1966

## 2020-06-19 ENCOUNTER — Encounter: Payer: Federal, State, Local not specified - PPO | Admitting: Physical Therapy

## 2020-06-23 ENCOUNTER — Ambulatory Visit (INDEPENDENT_AMBULATORY_CARE_PROVIDER_SITE_OTHER): Payer: Federal, State, Local not specified - PPO

## 2020-06-23 ENCOUNTER — Ambulatory Visit (INDEPENDENT_AMBULATORY_CARE_PROVIDER_SITE_OTHER): Payer: Federal, State, Local not specified - PPO | Admitting: Sports Medicine

## 2020-06-23 ENCOUNTER — Other Ambulatory Visit: Payer: Self-pay

## 2020-06-23 DIAGNOSIS — R1012 Left upper quadrant pain: Secondary | ICD-10-CM

## 2020-06-23 DIAGNOSIS — M545 Low back pain: Secondary | ICD-10-CM | POA: Diagnosis not present

## 2020-06-23 DIAGNOSIS — M51369 Other intervertebral disc degeneration, lumbar region without mention of lumbar back pain or lower extremity pain: Secondary | ICD-10-CM

## 2020-06-23 DIAGNOSIS — M5136 Other intervertebral disc degeneration, lumbar region: Secondary | ICD-10-CM

## 2020-06-23 NOTE — Assessment & Plan Note (Signed)
Angela Oliver returns, she continues to have axial back pain, worse with sitting, flexion, Valsalva, she has done formal physical therapy, had steroids, at this point we are going to proceed with an MRI today and a lumbar epidural.

## 2020-06-23 NOTE — Assessment & Plan Note (Signed)
Jamica has had some relatively chronic left upper quadrant abdominal pain, she endorses this pain just below her costal margin. She has had an abdominal and pelvic CT as well as an upper endoscopy that were unrevealing, it sounds as though there was a hiatal hernia.  She has no change in her pain with food. She does endorse worsening of pain with twisting motions, I think this is upper rectus overuse injury. We added a rib belt, if this does not help over the next 2 to 4 weeks I can do a trigger point injection to her rectus and possibly obliques.

## 2020-06-23 NOTE — Progress Notes (Signed)
    Procedures performed today:    Thorax was strapped with a compressive dressing.  Independent interpretation of notes and tests performed by another provider:   None.  Brief History, Exam, Impression, and Recommendations:    Lumbar degenerative disc disease Angela Oliver returns, she continues to have axial back pain, worse with sitting, flexion, Valsalva, she has done formal physical therapy, had steroids, at this point we are going to proceed with an MRI today and a lumbar epidural.  Left upper quadrant pain Angela Oliver has had some relatively chronic left upper quadrant abdominal pain, she endorses this pain just below her costal margin. She has had an abdominal and pelvic CT as well as an upper endoscopy that were unrevealing, it sounds as though there was a hiatal hernia.  She has no change in her pain with food. She does endorse worsening of pain with twisting motions, I think this is upper rectus overuse injury. We added a rib belt, if this does not help over the next 2 to 4 weeks I can do a trigger point injection to her rectus and possibly obliques.     ___________________________________________ [name]. Benjamin Stain, M.D., ABFM., CAQSM. Primary Care and Sports Medicine [facility]  Adjunct Instructor of Family Medicine  [employer]

## 2020-06-27 ENCOUNTER — Ambulatory Visit (INDEPENDENT_AMBULATORY_CARE_PROVIDER_SITE_OTHER): Payer: Federal, State, Local not specified - PPO | Admitting: Physical Therapy

## 2020-06-27 ENCOUNTER — Other Ambulatory Visit: Payer: Self-pay

## 2020-06-27 ENCOUNTER — Encounter: Payer: Self-pay | Admitting: Physical Therapy

## 2020-06-27 DIAGNOSIS — M5136 Other intervertebral disc degeneration, lumbar region: Secondary | ICD-10-CM

## 2020-06-27 DIAGNOSIS — R29898 Other symptoms and signs involving the musculoskeletal system: Secondary | ICD-10-CM

## 2020-06-27 DIAGNOSIS — M2560 Stiffness of unspecified joint, not elsewhere classified: Secondary | ICD-10-CM | POA: Diagnosis not present

## 2020-06-27 NOTE — Therapy (Signed)
[facility] [place] [address] [place], [place], [address] Phone: [phone]   Fax:  [phone]  Physical Therapy Treatment  Patient Details  Name: Angela Oliver MRN: [id number] Date of Birth: 08-Feb-1966 Referring Provider (PT): Dr Ilsa Iha    Encounter Date: 06/27/2020   PT End of Session - 06/27/20 1838    Visit Number 7    Number of Visits 12    Date for PT Re-Evaluation 07/03/20    PT Start Time 1405    PT Stop Time 1445    PT Time Calculation (min) 40 min    Activity Tolerance Patient tolerated treatment well    Behavior During Therapy [facility] for tasks assessed/performed           Past Medical History:  Diagnosis Date  . Anxiety   . Arthritis   . Asthma   . Complication of anesthesia    slow to awaken  . Family history of adverse reaction to anesthesia    mother slow to awaken  . GERD (gastroesophageal reflux disease)   . Herpes simplex   . Irregular heart beat    in the past- no palpations- "skipped a beat"  . Migraines   . Seasonal allergies     Past Surgical History:  Procedure Laterality Date  . APPENDECTOMY    . CHOLECYSTECTOMY  06/01/2016   laproscopic   . CHOLECYSTECTOMY N/A 06/01/2016   Procedure: LAPAROSCOPIC CHOLECYSTECTOMY WITH INTRAOPERATIVE CHOLANGIOGRAM, REMOVAL PERITONEAL NODULE ADJACENT TO SIGMOID COLON;  Surgeon: Avel Peace, MD;  Location: [facility] OR;  Service: General;  Laterality: N/A;  . COLONOSCOPY     age 20  . OVARIAN CYST REMOVAL Left    part of ovary- cyst hadf ruptured    There were no vitals filed for this visit.   Subjective Assessment - 06/27/20 1838    Subjective Pt reports she had an MRI of low back recently.  She states her back is getting stronger with the aquatic therapy, but she is not interested in "On-land" sessions at this time.  She states she feels she can do land exercises and use her TENS at home.  She is still unable to sleep in her bed because she wakes with  increased pain in her low back.    Patient Stated Goals try to get some mobility back and strengthen muscules in the back    Currently in Pain? Yes    Pain Score 6     Pain Location Back    Pain Orientation Right;Left;Lower   Rt > Lt   Pain Descriptors / Indicators Aching    Aggravating Factors  prolonged standing, attempting to sleep in the bed.    Pain Relieving Factors ice, rest, TENS           Pt seen for aquatic therapy today.  Treatment took place in water 3.5-4 ft in depth at [facility]. Temp of water was 86.  Pt entered/exited the pool independently via ramp with bilat rail.  Treatment:  Exercises - 50 meters of forward gait, additional 25 meters with pool noodle held with BUE on surface of water.  Side step squat, submerging to shoulders with arms pressing down on return upright x 40 meters R/L. Retro gait with focus on core engagement, hip ext x 50 meters.   Ai Chi postures: (5-10 reps of each) Floating, Enclosing, Soothing, and Freeing to work on balance and core engagement.  At side of pool with UE support on edge: hip ext, hip  flex and abdct each leg x 20, with buoyancy cuffs on ankles.  Prone- hands on edge, noodle at waist, buoyancy cuffs on ankles - flutter kick x 10, repeated without noodle.  Low back stretch with feet on first foot hole and hands on bilat rails x 20 sec.  Standing trunk ext x 15 sec x 2 reps. Hip flexor stretch x 15 sec each side.    Pt requires buoyancy for support and to offload joints with strengthening exercises. Viscosity of the water is needed for resistance of strengthening; water current perturbations provides challenge to standing balance unsupported, requiring increased core activation and LE quad/hamstring co-contraction.      PT Long Term Goals - 06/13/20 1450      PT LONG TERM GOAL #1   Title I with advanced HEP (01/12/18)     Time 6    Period Weeks    Status On-going      PT LONG TERM GOAL #2   Title improve FOTO =/<  40% limited    Time 6    Period Weeks    Status On-going      PT LONG TERM GOAL #3   Title report =/> 50% reduction of pain in her back with functional activities and work    Time 6    Period Weeks    Status On-going      PT LONG TERM GOAL #4   Title patient reports ability to sleep in her bed for 3-4 hours with minimal to no increase in pain    Time 6    Period Weeks    Status On-going      PT LONG TERM GOAL #5   Title increase hip mobilty and lumbar mobilty/ROM allowing patient to perform functional transfers and reaching with minimal increase in pain    Time 6    Period Weeks    Status On-going                 Plan - 06/27/20 1901    Clinical Impression Statement Pt had some increase in Rt SI joint discomfort with retro gait.  She tolerated all other aquatic exercises well, reporting slight decrease in pain while in the water.  Pt progressing towards LTG 3 and 5.  Limited progress on LTG 4.    Personal Factors and Comorbidities Age;Past/Current Experience;Comorbidity 1    Comorbidities obesity    Examination-Activity Limitations Locomotion Level;Transfers;Bend;Sit;Stand;Toileting;Lift;Hygiene/Grooming;Dressing;Continence;Bed Mobility    Examination-Participation Restrictions Cleaning;Laundry    Stability/Clinical Decision Making Evolving/Moderate complexity    Rehab Potential Good    PT Frequency 2x / week    PT Duration 6 weeks    PT Treatment/Interventions Patient/family education;ADLs/Self Care Home Management;Cryotherapy;Electrical Stimulation;Iontophoresis 4mg /ml Dexamethasone;Moist Heat;Ultrasound;Therapeutic activities;Therapeutic exercise;Balance training;Neuromuscular re-education;Manual techniques;Dry needling    PT Next Visit Plan Continue aquatic therapy.    Consulted and Agree with Plan of Care Patient           Patient will benefit from skilled therapeutic intervention in order to improve the following deficits and impairments:  Pain, Obesity,  Increased fascial restricitons, Decreased range of motion, Increased muscle spasms, Hypomobility, Decreased mobility, Decreased strength, Postural dysfunction, Improper body mechanics, Impaired flexibility  Visit Diagnosis: DDD (degenerative disc disease), lumbar  Joint stiffness of spine  Other symptoms and signs involving the musculoskeletal system     Problem List Patient Active Problem List   Diagnosis Date Noted  . OAB (overactive bladder) 05/28/2020  . Left upper quadrant pain 05/28/2020  . Abdominal distension (gaseous)  05/28/2020  . Epigastric pain 11/26/2019  . Cyst of left kidney 09/10/2019  . Right kidney stone 09/10/2019  . Seasonal allergies 03/18/2019  . Stress reaction 01/01/2019  . DDD (degenerative disc disease), lumbar 01/01/2019  . Dysfunction of left eustachian tube 02/01/2018  . Carpal tunnel syndrome on both sides 11/29/2017  . Pain of right breast 07/07/2017  . Family history of breast cancer in mother 07/07/2017  . Abnormal weight gain 07/07/2017  . Obesity (BMI 30.0-34.9) 01/27/2017  . C7 radiculopathy 12/01/2015  . 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 Lumbar degenerative disc disease 02/19/2014  . Trochanteric bursitis of right hip 11/22/2013  . 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/2012  . Trochanteric bursitis of both hips 11/03/2012  . 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 Herpes simplex 10/23/2012  . Allergic rhinitis 06/09/2011  . Asthma 06/09/2011  . GERD 06/09/2011   Mayer Camel, PTA 06/27/20 7:06 PM  [facility] [place] [address] [place] [address] [place], [place], [address] Phone: [phone]   Fax:  [phone]  Name: Angela Oliver MRN: [id number] Date of Birth: 04-21-1966

## 2020-07-04 ENCOUNTER — Ambulatory Visit: Payer: Federal, State, Local not specified - PPO | Admitting: Physician Assistant

## 2020-07-11 ENCOUNTER — Other Ambulatory Visit: Payer: Self-pay

## 2020-07-11 ENCOUNTER — Encounter: Payer: Self-pay | Admitting: Physical Therapy

## 2020-07-11 ENCOUNTER — Ambulatory Visit (INDEPENDENT_AMBULATORY_CARE_PROVIDER_SITE_OTHER): Payer: Federal, State, Local not specified - PPO | Admitting: Physical Therapy

## 2020-07-11 DIAGNOSIS — M2560 Stiffness of unspecified joint, not elsewhere classified: Secondary | ICD-10-CM | POA: Diagnosis not present

## 2020-07-11 DIAGNOSIS — M5136 Other intervertebral disc degeneration, lumbar region: Secondary | ICD-10-CM

## 2020-07-11 DIAGNOSIS — R29898 Other symptoms and signs involving the musculoskeletal system: Secondary | ICD-10-CM

## 2020-07-11 NOTE — Therapy (Signed)
[address] [place], [place], [address] Phone: [phone]   Fax:  [phone]  Physical Therapy Treatment  Patient Details  Name: Angela Oliver MRN: [id number] Date of Birth: 1966-10-13 Referring Provider (PT): Dr Romona Curls    Encounter Date: 07/11/2020   PT End of Session - 07/11/20 1831    Visit Number 8    Number of Visits 12    PT Start Time 1450    PT Stop Time 8127   pool evacuated due to vomit, session shortened   PT Time Calculation (min) 31 min    Activity Tolerance Patient tolerated treatment well    Behavior During Therapy [facility] for tasks assessed/performed           Past Medical History:  Diagnosis Date  . Anxiety   . Arthritis   . Asthma   . Complication of anesthesia    slow to awaken  . Family history of adverse reaction to anesthesia    mother slow to awaken  . GERD (gastroesophageal reflux disease)   . Herpes simplex   . Irregular heart beat    in the past- no palpations- "skipped a beat"  . Migraines   . Seasonal allergies     Past Surgical History:  Procedure Laterality Date  . APPENDECTOMY    . CHOLECYSTECTOMY  06/01/2016   laproscopic   . CHOLECYSTECTOMY N/A 06/01/2016   Procedure: LAPAROSCOPIC CHOLECYSTECTOMY WITH INTRAOPERATIVE CHOLANGIOGRAM, REMOVAL PERITONEAL NODULE ADJACENT TO SIGMOID COLON;  Surgeon: Jackolyn Confer, MD;  Location: [place];  Service: General;  Laterality: N/A;  . COLONOSCOPY     age 31  . OVARIAN CYST REMOVAL Left    part of ovary- cyst hadf ruptured    There were no vitals filed for this visit.   Subjective Assessment - 07/11/20 1837    Subjective Pt reports 35% improvement in back pain.  She is able to sleep 5-6 hr without waking from pain, but is still in her recliner.  She is able to complete transfers without pain, but her back pain with reaching causes her to "catch her breath". She reports her pain increases considerably with varied tasks at  work. She states she is learning to manage her pain.    Currently in Pain? Yes    Pain Score 6     Pain Location Back    Pain Orientation Right;Left;Lower    Pain Descriptors / Indicators Aching    Aggravating Factors  attempting to sleep in bed; reaching    Pain Relieving Factors ice/heat, rest, TENS.              [facility] PT Assessment - 07/11/20 0001      Assessment   Medical Diagnosis Lumbar DDD    Referring Provider (PT) Dr Romona Curls     Onset Date/Surgical Date 04/12/20    Hand Dominance Right    Prior Therapy yes here for LBP and knee pain       Observation/Other Assessments   Focus on Therapeutic Outcomes (FOTO)  43% limitation (40% is goal)            Pt seen for aquatic therapy today.  Treatment took place in water 3-3.5 ft in depth at [facility]. Temp of water was 87.  Pt entered the pool independently with bilat UE support on rail via ramp.  Pt exited the pool with supervision via 3 step ladder with bilat rail.   Treatment:   Side stepping Rt/Lt 100 meters.  Forward and retro walking each 50 meters.  Single high kneeling with Ai Chi postures floating, uplifting, and enclosing - each 10 reps. Standing hip flexor stretch x 20 sec each leg.  With buoyancy cuffs on ankles:  Standing hip ext x 10 each leg, hip abdct x 15 each leg.  High knee marching x 20.   Hip ER x 12 reps each leg. - all with cues to engage core during exertion of LEs.   Pt requires buoyancy for support and to offload joints with strengthening exercises. Viscosity of the water is needed for resistance of strengthening; water current perturbations provides challenge to standing balance unsupported, requiring increased core activation.     PT Long Term Goals - 06/13/20 1450      PT LONG TERM GOAL #1   Title I with advanced HEP (01/12/18)     Time 6    Period Weeks    Status On-going      PT LONG TERM GOAL #2   Title improve FOTO =/< 40% limited    Time 6    Period Weeks     Status On-going      PT LONG TERM GOAL #3   Title report =/> 50% reduction of pain in her back with functional activities and work    Time 6    Period Weeks    Status On-going      PT LONG TERM GOAL #4   Title patient reports ability to sleep in her bed for 3-4 hours with minimal to no increase in pain    Time 6    Period Weeks    Status On-going      PT LONG TERM GOAL #5   Title increase hip mobilty and lumbar mobilty/ROM allowing patient to perform functional transfers and reaching with minimal increase in pain    Time 6    Period Weeks    Status On-going                 Plan - 07/11/20 1832    Clinical Impression Statement Pt overall reporting some improvement in symptoms of LBP since initiating therapy.  Pt reporting improved sleep, however still sleeping in recliner.  LTG 3,4,and 5 have been partially met. Pt tolerated aquatic session well, with less episodes of pain than last session.  Pt would benefit from continuation of therapy to maximize mobility with less pain.    Personal Factors and Comorbidities Age;Past/Current Experience;Comorbidity 1    Comorbidities obesity    Examination-Activity Limitations Locomotion Level;Transfers;Bend;Sit;Stand;Toileting;Lift;Hygiene/Grooming;Dressing;Continence;Bed Mobility    Examination-Participation Restrictions Cleaning;Laundry    Stability/Clinical Decision Making Evolving/Moderate complexity    Rehab Potential Good    PT Frequency 2x / week    PT Duration 6 weeks    PT Treatment/Interventions Patient/family education;ADLs/Self Care Home Management;Cryotherapy;Electrical Stimulation;Iontophoresis 61m/ml Dexamethasone;Moist Heat;Ultrasound;Therapeutic activities;Therapeutic exercise;Balance training;Neuromuscular re-education;Manual techniques;Dry needling    PT Next Visit Plan Continue aquatic therapy.    Consulted and Agree with Plan of Care Patient           Patient will benefit from skilled therapeutic intervention in  order to improve the following deficits and impairments:  Pain, Obesity, Increased fascial restricitons, Decreased range of motion, Increased muscle spasms, Hypomobility, Decreased mobility, Decreased strength, Postural dysfunction, Improper body mechanics, Impaired flexibility  Visit Diagnosis: DDD (degenerative disc disease), lumbar  Joint stiffness of spine  Other symptoms and signs involving the musculoskeletal system     Problem List Patient Active Problem List   Diagnosis Date Noted  .  OAB (overactive bladder) 05/28/2020  . Left upper quadrant pain 05/28/2020  . Abdominal distension (gaseous) 05/28/2020  . Epigastric pain 11/26/2019  . Cyst of left kidney 09/10/2019  . Right kidney stone 09/10/2019  . Seasonal allergies 03/18/2019  . Stress reaction 01/01/2019  . DDD (degenerative disc disease), lumbar 01/01/2019  . Dysfunction of left eustachian tube 02/01/2018  . Carpal tunnel syndrome on both sides 11/29/2017  . Pain of right breast 07/07/2017  . Family history of breast cancer in mother 07/07/2017  . Abnormal weight gain 07/07/2017  . Obesity (BMI 30.0-34.9) 01/27/2017  . C7 radiculopathy 12/01/2015  . 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 Lumbar degenerative disc disease 02/19/2014  . Trochanteric bursitis of right hip 11/22/2013  . 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/2012  . Trochanteric bursitis of both hips 11/03/2012  . 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 Herpes simplex 10/23/2012  . Allergic rhinitis 06/09/2011  . Asthma 06/09/2011  . GERD 06/09/2011   Kerin Perna, PTA 07/11/20 6:49 PM  [place] [address] [address] [place], [place], [address] Phone: [phone]   Fax:  [phone]  Name: Angela Oliver MRN: [id number] Date of Birth: 1966/09/13

## 2020-07-21 ENCOUNTER — Ambulatory Visit (INDEPENDENT_AMBULATORY_CARE_PROVIDER_SITE_OTHER): Payer: Federal, State, Local not specified - PPO | Admitting: Sports Medicine

## 2020-07-21 ENCOUNTER — Encounter: Payer: Self-pay | Admitting: Sports Medicine

## 2020-07-21 DIAGNOSIS — M51369 Other intervertebral disc degeneration, lumbar region without mention of lumbar back pain or lower extremity pain: Secondary | ICD-10-CM

## 2020-07-21 DIAGNOSIS — M47816 Spondylosis without myelopathy or radiculopathy, lumbar region: Secondary | ICD-10-CM

## 2020-07-21 DIAGNOSIS — M5136 Other intervertebral disc degeneration, lumbar region: Secondary | ICD-10-CM

## 2020-07-21 MED ORDER — HYDROCODONE-ACETAMINOPHEN 5-325 MG PO TABS: 1.0000 | ORAL_TABLET | Freq: Every day | ORAL | 0 refills | Status: DC | PRN

## 2020-07-21 NOTE — Assessment & Plan Note (Addendum)
Unfortunately Angela Oliver continues to have back pain, we finally obtained an MRI after failure of conservative treatment which is listed above, I think her dominant pain generator is her left L4-L5 facet arthritis and synovial cyst, because she has significant bilateral facet degenerative changes I would like her to proceed with a bilateral L4-S1 facet joint injection series with Dr. Laurian Brim. I would also like Angela Oliver to do some more aquatic therapy.

## 2020-07-21 NOTE — Patient Instructions (Signed)
Facet Joint Block The facet joints connect the bones of the spine (vertebrae). They make it possible for you to bend, twist, and make other movements with your spine. They also keep you from bending too far, twisting too far, and making other extreme movements. A facet joint block is a procedure in which a numbing medicine (anesthetic) is injected into a facet joint. In many cases, an anti-inflammatory medicine (steroid) is also injected. A facet joint block may be done:  To diagnose neck or back pain. If the pain gets better after a facet joint block, it means the pain is probably coming from the facet joint. If the pain does not get better, it means the pain is probably not coming from the facet joint.  To relieve neck or back pain that is caused by an inflamed facet joint. A facet joint block is only done to relieve pain if the pain does not improve with other methods, such as medicine, exercise programs, and physical therapy. Tell a health care provider about:  Any allergies you have.  All medicines you are taking, including vitamins, herbs, eye drops, creams, and over-the-counter medicines.  Any problems you or family members have had with anesthetic medicines.  Any blood disorders you have.  Any surgeries you have had.  Any medical conditions you have or have had.  Whether you are pregnant or may be pregnant. What are the risks? Generally, this is a safe procedure. However, problems may occur, including:  Bleeding.  Injury to a nerve near the injection site.  Pain at the injection site.  Weakness or numbness in areas controlled by nerves near the injection site.  Infection.  Temporary fluid retention.  Allergic reactions to medicines or dyes.  Injury to other structures or organs near the injection site. What happens before the procedure? Medicines Ask your health care provider about:  Changing or stopping your regular medicines. This is especially important if you  are taking diabetes medicines or blood thinners.  Taking medicines such as aspirin and ibuprofen. These medicines can thin your blood. Do not take these medicines unless your health care provider tells you to take them.  Taking over-the-counter medicines, vitamins, herbs, and supplements. Eating and drinking Follow instructions from your health care provider about eating and drinking, which may include:  8 hours before the procedure - stop eating heavy meals or foods, such as meat, fried foods, or fatty foods.  6 hours before the procedure - stop eating light meals or foods, such as toast or cereal.  6 hours before the procedure - stop drinking milk or drinks that contain milk.  2 hours before the procedure - stop drinking clear liquids. Staying hydrated Follow instructions from your health care provider about hydration, which may include:  Up to 2 hours before the procedure - you may continue to drink clear liquids, such as water, clear fruit juice, black coffee, and plain tea. General instructions  Do not use any products that contain nicotine or tobacco for at least 4-6 weeks before the procedure. These products include cigarettes, e-cigarettes, and chewing tobacco. If you need help quitting, ask your health care provider.  Plan to have someone take you home from the hospital or clinic.  Ask your health care provider: ? How your surgery site will be marked. ? What steps will be taken to help prevent infection. These may include:  Removing hair at the surgery site.  Washing skin with a germ-killing soap.  Receiving antibiotic medicine. What happens during  the procedure?   You will put on a hospital gown.  You will lie on your stomach on an X-ray table. You may be asked to lie in a different position if an injection will be made in your neck.  Machines will be used to monitor your oxygen levels, heart rate, and blood pressure.  Your skin will be cleaned.  If an injection  will be made in your neck, an IV will be inserted into one of your veins. Fluids and medicine will flow directly into your body through the IV.  A numbing medicine (local anesthetic) will be applied to your skin. Your skin may sting or burn for a moment.  A video X-ray machine (fluoroscopy) will be used to find the joint. In some cases, a CT scan may be used.  A contrast dye may be injected into the facet joint area to help find the joint.  When the joint is located, an anesthetic will be injected into the joint through the needle.  Your health care provider will ask you whether you feel pain relief. ? If you feel relief, a steroid may be injected to provide pain relief for a longer period of time. ? If you do not feel relief or feel only partial relief, additional injections of an anesthetic may be made in other facet joints.  The needle will be removed.  Your skin will be cleaned.  A bandage (dressing) will be applied over each injection site. The procedure may vary among health care providers and hospitals. What happens after the procedure?  Your blood pressure, heart rate, breathing rate, and blood oxygen level will be monitored until you leave the hospital or clinic.  You will lie down and rest for a period of time. Summary  A facet joint block is a procedure in which a numbing medicine (anesthetic) is injected into a facet joint. An anti-inflammatory medicine (stereoid) may also be injected.  Follow instructions from your health care provider about medicines and eating and drinking before the procedure.  Do not use any products that contain nicotine or tobacco for at least 4-6 weeks before the procedure.  You will lie on your stomach for the procedure, but you may be asked to lie in a different position if an injection will be made in your neck.  When the joint is located, an anesthetic will be injected into the joint through the needle. This information is not intended to  replace advice given to you by your health care provider. Make sure you discuss any questions you have with your health care provider. Document Revised: 03/22/2019 Document Reviewed: 11/03/2018 Elsevier Patient Education  2020 [employer].

## 2020-07-21 NOTE — Progress Notes (Signed)
    Procedures performed today:    None.  Independent interpretation of notes and tests performed by another provider:   MRI personally reviewed, there is L3-L4 DDD with Schmorl's nodes, loss of disc height, I also see fairly severe facet arthritis from L4-S1 bilaterally with a left-sided L4-L5 facet synovial cyst.  Brief History, Exam, Impression, and Recommendations:    Lumbar spondylosis Unfortunately Angela Oliver continues to have back pain, we finally obtained an MRI after failure of conservative treatment which is listed above, I think her dominant pain generator is her left L4-L5 facet arthritis and synovial cyst, because she has significant bilateral facet degenerative changes I would like her to proceed with a bilateral L4-S1 facet joint injection series with Dr. Laurian Brim. I would also like [name] to do some more aquatic therapy.    ___________________________________________ [name]. Benjamin Stain, M.D., ABFM., CAQSM. Primary Care and Sports Medicine [facility]  Adjunct Instructor of Family Medicine  [employer]

## 2020-07-24 ENCOUNTER — Encounter: Payer: Federal, State, Local not specified - PPO | Admitting: Rehabilitative and Restorative Service Providers"

## 2020-07-31 ENCOUNTER — Other Ambulatory Visit: Payer: Self-pay

## 2020-07-31 ENCOUNTER — Encounter: Payer: Self-pay | Admitting: Rehabilitative and Restorative Service Providers"

## 2020-07-31 ENCOUNTER — Ambulatory Visit (INDEPENDENT_AMBULATORY_CARE_PROVIDER_SITE_OTHER): Payer: Federal, State, Local not specified - PPO | Admitting: Rehabilitative and Restorative Service Providers"

## 2020-07-31 DIAGNOSIS — M2560 Stiffness of unspecified joint, not elsewhere classified: Secondary | ICD-10-CM

## 2020-07-31 DIAGNOSIS — M5136 Other intervertebral disc degeneration, lumbar region: Secondary | ICD-10-CM | POA: Diagnosis not present

## 2020-07-31 DIAGNOSIS — R29898 Other symptoms and signs involving the musculoskeletal system: Secondary | ICD-10-CM | POA: Diagnosis not present

## 2020-07-31 NOTE — Therapy (Signed)
[facility] [place] [address] [place] [address] [place], [place], [address] Phone: [phone]   Fax:  [phone]  Physical Therapy Treatment  Patient Details  Name: Angela Oliver MRN: [id number] Date of Birth: [birth year] Referring Provider (PT): Dr Ilsa Iha    Encounter Date: 07/31/2020   PT End of Session - 07/31/20 1542    Visit Number 9    Number of Visits 20    Date for PT Re-Evaluation 09/11/20    PT Start Time 1535    PT Stop Time 1615    PT Time Calculation (min) 40 min    Activity Tolerance Patient tolerated treatment well           Past Medical History:  Diagnosis Date  . Anxiety   . Arthritis   . Asthma   . Complication of anesthesia    slow to awaken  . Family history of adverse reaction to anesthesia    mother slow to awaken  . GERD (gastroesophageal reflux disease)   . Herpes simplex   . Irregular heart beat    in the past- no palpations- "skipped a beat"  . Migraines   . Seasonal allergies     Past Surgical History:  Procedure Laterality Date  . APPENDECTOMY    . CHOLECYSTECTOMY  06/01/2016   laproscopic   . CHOLECYSTECTOMY N/A 06/01/2016   Procedure: LAPAROSCOPIC CHOLECYSTECTOMY WITH INTRAOPERATIVE CHOLANGIOGRAM, REMOVAL PERITONEAL NODULE ADJACENT TO SIGMOID COLON;  Surgeon: Avel Peace, MD;  Location: [facility] OR;  Service: General;  Laterality: N/A;  . COLONOSCOPY     age 26  . OVARIAN CYST REMOVAL Left    part of ovary- cyst hadf ruptured    There were no vitals filed for this visit.   Subjective Assessment - 07/31/20 1544    Subjective Patient reports good response to aquatic therapy. Has her hot tub working now and can do some exercises in the hot tub. Plans to check on pool exercise more frequently. Still sleepin gin her chair. Can lie on the Lt side while in the chair but not the Rt    Currently in Pain? Yes    Pain Score 7     Pain Location Back    Pain Orientation Right;Left;Lower   Rt > Lt    Pain Descriptors / Indicators Aching    Pain Type Chronic pain;Acute pain    Pain Onset More than a month ago    Pain Frequency Constant    Aggravating Factors  standing at work; still not sleeping in the bed due to pain; reaching    Pain Relieving Factors ice/heat; rest; TENS              [facility] PT Assessment - 07/31/20 0001      Assessment   Medical Diagnosis Lumbar DDD    Referring Provider (PT) Dr Ilsa Iha     Onset Date/Surgical Date 04/12/20    Hand Dominance Right    Prior Therapy yes here for LBP and knee pain       Observation/Other Assessments   Focus on Therapeutic Outcomes (FOTO)  56% limitation       AROM   Lumbar Flexion 75% stiffness     Lumbar Extension 55% no pain     Lumbar - Right Side Bend 80% tight     Lumbar - Left Side Bend 80%    Lumbar - Right Rotation 60%     Lumbar - Left Rotation 50% tight       Strength  Overall Strength Comments LE strength WNL's bilat with resistive testing hip flex/ext      Flexibility   Hamstrings WFL's bilat     Quadriceps tight end range Lt > Rt     ITB tight end range Lt > Rt     Piriformis tight Lt > Rt       Palpation   Spinal mobility hypomobile with PA mobs the lumbar spine.     Palpation comment tender to palpatino Lt lumbar to posterior hip                         [facility] Adult PT Treatment/Exercise - 07/31/20 0001      Lumbar Exercises: Stretches   Passive Hamstring Stretch Right;Left;1 rep;20 seconds   supine with strap   Single Knee to Chest Stretch Right;Left;2 reps;20 seconds    Standing Extension 3 reps;10 seconds    ITB Stretch Right;Left;2 reps;20 seconds   supine with strap    Gastroc Stretch Right;Left;2 reps;20 seconds   incline board (together)   Gastroc Stretch Limitations Soleus stretch x 10 sec each leg       Lumbar Exercises: Aerobic   Nustep L5 L/UE's x 6 min       Lumbar Exercises: Standing   Wall Slides 10 reps   10 sec VC to engage core      Lumbar Exercises:  Supine   Ab Set 5 reps;5 seconds    Clam 10 reps;3 seconds   supine with blue TB    Bridge 5 reps                  PT Education - 07/31/20 1616    Education Details HEP    Person(s) Educated Patient    Methods Explanation;Demonstration;Tactile cues;Verbal cues;Handout    Comprehension Verbalized understanding;Returned demonstration;Verbal cues required;Tactile cues required               PT Long Term Goals - 07/31/20 1619      PT LONG TERM GOAL #1   Title I with advanced HEP    Time 6    Period Weeks    Status On-going    Target Date 09/11/20      PT LONG TERM GOAL #2   Title improve FOTO =/< 40% limited    Time 6    Period Weeks    Status On-going    Target Date 09/11/20      PT LONG TERM GOAL #3   Title report =/> 50% reduction of pain in her back with functional activities and work    Time 6    Period Weeks    Status On-going    Target Date 09/11/20      PT LONG TERM GOAL #4   Title patient reports ability to sleep in her bed for 3-4 hours with minimal to no increase in pain    Time 6    Period Weeks    Status On-going    Target Date 09/11/20      PT LONG TERM GOAL #5   Title increase hip mobilty and lumbar mobilty/ROM allowing patient to perform functional transfers and reaching with minimal increase in pain    Time 6    Period Weeks    Status On-going    Target Date 09/11/20                 Plan - 07/31/20 1610    Clinical Impression Statement Paitent progressing  with back rehab. She demonstrates increased lumbar mobility and ROM with decreased pain. She has increased ROM in hips/LE's. Patient demonstrates incresaed exercise tolerance. She is progressing toward goals of therapy and will benefit from continued PT to accomplish goals of therapy. We will continue with aquatic therapy and land based therapy.    Rehab Potential Good    PT Frequency 2x / week    PT Duration 6 weeks    PT Treatment/Interventions Patient/family  education;ADLs/Self Care Home Management;Cryotherapy;Electrical Stimulation;Iontophoresis 4mg /ml Dexamethasone;Moist Heat;Ultrasound;Therapeutic activities;Therapeutic exercise;Balance training;Neuromuscular re-education;Manual techniques;Dry needling    PT Next Visit Plan continue aquatic therapy and land based therapy to address core stability and strengthening    Consulted and Agree with Plan of Care Patient           Patient will benefit from skilled therapeutic intervention in order to improve the following deficits and impairments:     Visit Diagnosis: DDD (degenerative disc disease), lumbar - Plan: PT plan of care cert/re-cert  Joint stiffness of spine - Plan: PT plan of care cert/re-cert  Other symptoms and signs involving the musculoskeletal system - Plan: PT plan of care cert/re-cert     Problem List Patient Active Problem List   Diagnosis Date Noted  . OAB (overactive bladder) 05/28/2020  . Left upper quadrant pain 05/28/2020  . Abdominal distension (gaseous) 05/28/2020  . Epigastric pain 11/26/2019  . Cyst of left kidney 09/10/2019  . Right kidney stone 09/10/2019  . Seasonal allergies 03/18/2019  . Stress reaction 01/01/2019  . DDD (degenerative disc disease), lumbar 01/01/2019  . Dysfunction of left eustachian tube 02/01/2018  . Carpal tunnel syndrome on both sides 11/29/2017  . Pain of right breast 07/07/2017  . Family history of breast cancer in mother 07/07/2017  . Abnormal weight gain 07/07/2017  . Obesity (BMI 30.0-34.9) 01/27/2017  . C7 radiculopathy 12/01/2015  . 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 Lumbar spondylosis 02/19/2014  . Trochanteric bursitis of right hip 11/22/2013  . 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/2012  . Trochanteric bursitis of both hips 11/03/2012  . 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 Herpes simplex 10/23/2012  . Allergic rhinitis 06/09/2011  . Asthma 06/09/2011  . GERD 06/09/2011     Angela Oliver 06/11/2013 PT, MPH  07/31/2020, 4:22 PM  [facility] [address] [place] [address] [place], [place], [place] Phone: [phone]   Fax:  [phone]  Name: Angela Oliver MRN: [name] Date of Birth: [birth year]

## 2020-07-31 NOTE — Patient Instructions (Signed)
Access Code: [email or web address]: [email or web address]: 08/19/2021Prepared by: Chai Routh HoltExercises  Hooklying Isometric Clamshell - 2 x daily - 7 x weekly - 1 sets - 10 reps - 3 sec hold  Wall Quarter Squat - 2 x daily - 7 x weekly - 1-2 sets - 10 reps - 5-10 sec hold

## 2020-08-01 ENCOUNTER — Ambulatory Visit (INDEPENDENT_AMBULATORY_CARE_PROVIDER_SITE_OTHER): Payer: Federal, State, Local not specified - PPO | Admitting: Physical Therapy

## 2020-08-01 DIAGNOSIS — R29898 Other symptoms and signs involving the musculoskeletal system: Secondary | ICD-10-CM | POA: Diagnosis not present

## 2020-08-01 DIAGNOSIS — M2560 Stiffness of unspecified joint, not elsewhere classified: Secondary | ICD-10-CM

## 2020-08-01 DIAGNOSIS — M5136 Other intervertebral disc degeneration, lumbar region: Secondary | ICD-10-CM

## 2020-08-01 NOTE — Therapy (Signed)
[facility] [place] [address] [employer] [address] [place], [place], [address] Phone: [phone]   Fax:  [phone]  Physical Therapy Treatment  Patient Details  Name: Angela Oliver MRN: [id number] Date of Birth: [birth year] Referring Provider (PT): Dr Ilsa Iha    Encounter Date: 08/01/2020   PT End of Session - 08/01/20 1619    Visit Number 10    Number of Visits 20    Date for PT Re-Evaluation 09/11/20    PT Start Time 1405    PT Stop Time 1450    PT Time Calculation (min) 45 min    Activity Tolerance Patient tolerated treatment well    Behavior During Therapy [facility] for tasks assessed/performed           Past Medical History:  Diagnosis Date  . Anxiety   . Arthritis   . Asthma   . Complication of anesthesia    slow to awaken  . Family history of adverse reaction to anesthesia    mother slow to awaken  . GERD (gastroesophageal reflux disease)   . Herpes simplex   . Irregular heart beat    in the past- no palpations- "skipped a beat"  . Migraines   . Seasonal allergies     Past Surgical History:  Procedure Laterality Date  . APPENDECTOMY    . CHOLECYSTECTOMY  06/01/2016   laproscopic   . CHOLECYSTECTOMY N/A 06/01/2016   Procedure: LAPAROSCOPIC CHOLECYSTECTOMY WITH INTRAOPERATIVE CHOLANGIOGRAM, REMOVAL PERITONEAL NODULE ADJACENT TO SIGMOID COLON;  Surgeon: Avel Peace, MD;  Location: [facility] OR;  Service: General;  Laterality: N/A;  . COLONOSCOPY     age 54  . OVARIAN CYST REMOVAL Left    part of ovary- cyst hadf ruptured    There were no vitals filed for this visit.   Subjective Assessment - 08/01/20 1624    Subjective Pt reports her carpal tunnel in both hands has worsened; Lt ring finger is going numb at times.  She states she was sore after therapy session yesterday, up to 8/10; took pain medication to reduce symptoms.  She states she believes the ITB stretch and the wall slides irritated her back.    Currently in Pain?  Yes    Pain Score 6     Pain Location Back    Pain Orientation Lower   R>L   Pain Descriptors / Indicators Aching;Sore    Pain Onset More than a month ago    Aggravating Factors  prolonged standing    Pain Relieving Factors ice, heat, rest, TENS           Pt seen for aquatic therapy today.  Treatment took place in water 3-4 ft in depth at [facility]. Temp of water was 87 degrees farenheit.  Pt entered/exited the pool independently via ramp with bilat rail.  Treatment:   Rt/Lt hamstring stretch with foot on bench in pool x 20 sec each leg.  Sit to/from stand on bench in pool with core engaged, no use of UE, x 10  With buoyancy cuffs on ankles for added resistance:   Forward walking x 100 meters, Backward walking 50 meters,  Side walking Rt/Lt 50 meters each with arms adducting to side each step.  High knee marching 25 meters.   Hip abdct x 10 reps each leg, Hip ext x 10 reps each leg - both performed with UE support on pool edge.    Ai Chi posture:  Freeing x 10 reps with cues for form and core  engagement.   Lower back stretch with feet on pool wall and hands holding edge, pushing buttocks backwards.   Braiding Rt x 15 meters,  Lt x 10 meters (challenging) - encouraging gentle hip / trunk rotation.   Prone float (with pool noodle under arms) to knees tucked and feet back onto pool floor, for lumbar ROM (ext to flex) and core engagement. - 8 reps.   Supine float with 2 pool noodles behind back and Nekdoodle around neck to support head:  core engagement with therapist leading patients body in CW/CCW circles, with side resistance from water  Bicycle kicking x 100 meters  Pt requires buoyancy for support and to offload joints with strengthening exercises. Viscosity of the water is needed for resistance of strengthening; water current perturbations provides challenge to standing balance unsupported, requiring increased core activation.     PT Long Term Goals - 07/31/20  1619      PT LONG TERM GOAL #1   Title I with advanced HEP    Time 6    Period Weeks    Status On-going    Target Date 09/11/20      PT LONG TERM GOAL #2   Title improve FOTO =/< 40% limited    Time 6    Period Weeks    Status On-going    Target Date 09/11/20      PT LONG TERM GOAL #3   Title report =/> 50% reduction of pain in her back with functional activities and work    Time 6    Period Weeks    Status On-going    Target Date 09/11/20      PT LONG TERM GOAL #4   Title patient reports ability to sleep in her bed for 3-4 hours with minimal to no increase in pain    Time 6    Period Weeks    Status On-going    Target Date 09/11/20      PT LONG TERM GOAL #5   Title increase hip mobilty and lumbar mobilty/ROM allowing patient to perform functional transfers and reaching with minimal increase in pain    Time 6    Period Weeks    Status On-going    Target Date 09/11/20                 Plan - 08/01/20 1619    Clinical Impression Statement Pt reported reduction of low back pain to a minimal amount when in the water to chest level.  Trialed some new aquatic exercises in the water today with good response, including prone floating for lumbar ext and supine floating with side resistance for core activation.  Pt voiced interest in coming to pool on own outside of therapy to take advantage of aquatic benefits on more regular basis.  Goals are ongoing.    Rehab Potential Good    PT Frequency 2x / week    PT Duration 6 weeks    PT Treatment/Interventions Patient/family education;ADLs/Self Care Home Management;Cryotherapy;Electrical Stimulation;Iontophoresis 4mg /ml Dexamethasone;Moist Heat;Ultrasound;Therapeutic activities;Therapeutic exercise;Balance training;Neuromuscular re-education;Manual techniques;Dry needling    PT Next Visit Plan continue aquatic therapy and land based therapy to address core stability and lumbar strengthening    Consulted and Agree with Plan of Care  Patient           Patient will benefit from skilled therapeutic intervention in order to improve the following deficits and impairments:  Pain, Obesity, Increased fascial restricitons, Decreased range of motion, Increased muscle spasms, Hypomobility, Decreased mobility,  Decreased strength, Postural dysfunction, Improper body mechanics, Impaired flexibility  Visit Diagnosis: DDD (degenerative disc disease), lumbar  Joint stiffness of spine  Other symptoms and signs involving the musculoskeletal system     Problem List Patient Active Problem List   Diagnosis Date Noted  . OAB (overactive bladder) 05/28/2020  . Left upper quadrant pain 05/28/2020  . Abdominal distension (gaseous) 05/28/2020  . Epigastric pain 11/26/2019  . Cyst of left kidney 09/10/2019  . Right kidney stone 09/10/2019  . Seasonal allergies 03/18/2019  . Stress reaction 01/01/2019  . DDD (degenerative disc disease), lumbar 01/01/2019  . Dysfunction of left eustachian tube 02/01/2018  . Carpal tunnel syndrome on both sides 11/29/2017  . Pain of right breast 07/07/2017  . Family history of breast cancer in mother 07/07/2017  . Abnormal weight gain 07/07/2017  . Obesity (BMI 30.0-34.9) 01/27/2017  . C7 radiculopathy 12/01/2015  . 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 Lumbar spondylosis 02/19/2014  . Trochanteric bursitis of right hip 11/22/2013  . 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/2012  . Trochanteric bursitis of both hips 11/03/2012  . 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 Herpes simplex 10/23/2012  . Allergic rhinitis 06/09/2011  . Asthma 06/09/2011  . GERD 06/09/2011   Mayer Camel, PTA 08/01/20 4:38 PM  [facility] [place] [address] [place] [address] [place], [place], [address] Phone: [phone]   Fax:  [phone]  Name: Ivanell Deshotel MRN: [id number] Date of Birth: 1966-10-28

## 2020-08-06 ENCOUNTER — Ambulatory Visit (INDEPENDENT_AMBULATORY_CARE_PROVIDER_SITE_OTHER): Payer: Federal, State, Local not specified - PPO | Admitting: Physical Therapy

## 2020-08-06 ENCOUNTER — Other Ambulatory Visit: Payer: Self-pay

## 2020-08-06 ENCOUNTER — Other Ambulatory Visit: Payer: Self-pay | Admitting: Physician Assistant

## 2020-08-06 ENCOUNTER — Encounter: Payer: Self-pay | Admitting: Physical Therapy

## 2020-08-06 DIAGNOSIS — M2560 Stiffness of unspecified joint, not elsewhere classified: Secondary | ICD-10-CM | POA: Diagnosis not present

## 2020-08-06 DIAGNOSIS — R29898 Other symptoms and signs involving the musculoskeletal system: Secondary | ICD-10-CM

## 2020-08-06 DIAGNOSIS — M5136 Other intervertebral disc degeneration, lumbar region: Secondary | ICD-10-CM

## 2020-08-06 DIAGNOSIS — K219 Gastro-esophageal reflux disease without esophagitis: Secondary | ICD-10-CM

## 2020-08-06 NOTE — Therapy (Signed)
[facility] [place] [address] [place], [place], [address] Phone: [phone]   Fax:  [phone]  Physical Therapy Treatment  Patient Details  Name: Angela Oliver MRN: [id number] Date of Birth: 12-26-1965 Referring Provider (PT): Dr Ilsa Iha    Encounter Date: 08/06/2020   PT End of Session - 08/06/20 1602    Visit Number 11    Number of Visits 20    Date for PT Re-Evaluation 09/11/20    PT Start Time 1521    PT Stop Time 1601    PT Time Calculation (min) 40 min    Activity Tolerance Patient tolerated treatment well    Behavior During Therapy [facility] for tasks assessed/performed           Past Medical History:  Diagnosis Date  . Anxiety   . Arthritis   . Asthma   . Complication of anesthesia    slow to awaken  . Family history of adverse reaction to anesthesia    mother slow to awaken  . GERD (gastroesophageal reflux disease)   . Herpes simplex   . Irregular heart beat    in the past- no palpations- "skipped a beat"  . Migraines   . Seasonal allergies     Past Surgical History:  Procedure Laterality Date  . APPENDECTOMY    . CHOLECYSTECTOMY  06/01/2016   laproscopic   . CHOLECYSTECTOMY N/A 06/01/2016   Procedure: LAPAROSCOPIC CHOLECYSTECTOMY WITH INTRAOPERATIVE CHOLANGIOGRAM, REMOVAL PERITONEAL NODULE ADJACENT TO SIGMOID COLON;  Surgeon: Avel Peace, MD;  [facility] OR;  Service: General;  Laterality: N/A;  . COLONOSCOPY     age 51  . OVARIAN CYST REMOVAL Left    part of ovary- cyst hadf ruptured    There were no vitals filed for this visit.   Subjective Assessment - 08/06/20 1526    Subjective "My pain is a little spikey".  Pt reporting pain with bending of the spine (ext/flex).  Pain worse with lifting bags at work and moving boxes at home.  She hasn't had a chance to look into use of pool; stressful family stuff going on.    Diagnostic tests degenerative changes through the lumbar spine    Patient  Stated Goals try to get some mobility back and strengthen muscules in the back    Currently in Pain? Yes    Pain Score 6     Pain Location Back    Pain Orientation Right;Lower    Pain Descriptors / Indicators Aching    Aggravating Factors  lifting, bending    Pain Relieving Factors ice, heat, TENS              [facility] PT Assessment - 08/06/20 0001      Assessment   Medical Diagnosis Lumbar DDD    Referring Provider (PT) Dr Ilsa Iha     Onset Date/Surgical Date 04/12/20    Hand Dominance Right    Prior Therapy yes here for LBP and knee pain             [facility] Adult PT Treatment/Exercise - 08/06/20 0001      Lumbar Exercises: Stretches   Passive Hamstring Stretch Right;Left;2 reps;30 seconds   supine with strap   Lower Trunk Rotation 2 reps;10 seconds    Lower Trunk Rotation Limitations legs wide for more hips stretch    Standing Extension 2 reps;10 reps   forarms on wall, leaning hips forward      Lumbar Exercises: Aerobic   Nustep L5 L/UE's  x 6 min       Lumbar Exercises: Seated   Other Seated Lumbar Exercises seated on dynadisc on low mat table:  pelvic tilts (ant/post and side to side) then CW, CCW  x 10 of each (tolerated well)      Lumbar Exercises: Supine   Clam 10 reps;3 seconds   strap around thighs, isometrics   Bridge 10 reps    Bridge Limitations unable to tolerate bridge with hip abdct isometric      Lumbar Exercises: Quadruped   Other Quadruped Lumbar Exercises trial of quadruped to attempt primal push up - pt unable to tolerate due to knee pain.    Trialed primal push up position with hands on edge of mat table and knees in flexed position off of mat x 15 sec (challenging)      Modalities   Modalities --   deferred; will do at home.                       PT Long Term Goals - 07/31/20 1619      PT LONG TERM GOAL #1   Title I with advanced HEP    Time 6    Period Weeks    Status On-going    Target Date 09/11/20      PT LONG TERM  GOAL #2   Title improve FOTO =/< 40% limited    Time 6    Period Weeks    Status On-going    Target Date 09/11/20      PT LONG TERM GOAL #3   Title report =/> 50% reduction of pain in her back with functional activities and work    Time 6    Period Weeks    Status On-going    Target Date 09/11/20      PT LONG TERM GOAL #4   Title patient reports ability to sleep in her bed for 3-4 hours with minimal to no increase in pain    Time 6    Period Weeks    Status On-going    Target Date 09/11/20      PT LONG TERM GOAL #5   Title increase hip mobilty and lumbar mobilty/ROM allowing patient to perform functional transfers and reaching with minimal increase in pain    Time 6    Period Weeks    Status On-going    Target Date 09/11/20                 Plan - 08/06/20 1532    Clinical Impression Statement Pt unable to tolerate hip abdct isometric WITH bridge, but tolerated reg isometric with strap well.  Unable to tolerate quadruped position, but pelvic tilts on dynadisc provided relief.  Encouraged pt to continue gentle ROM and strenthening exercises as time allows to assist with meeting goals.    Rehab Potential Good    PT Frequency 2x / week    PT Duration 6 weeks    PT Treatment/Interventions Patient/family education;ADLs/Self Care Home Management;Cryotherapy;Electrical Stimulation;Iontophoresis 4mg /ml Dexamethasone;Moist Heat;Ultrasound;Therapeutic activities;Therapeutic exercise;Balance training;Neuromuscular re-education;Manual techniques;Dry needling    PT Next Visit Plan continue aquatic therapy and land based therapy to address core stability and lumbar strengthening    PT Home Exercise Plan [id number],  (previous one: )           Patient will benefit from skilled therapeutic intervention in order to improve the following deficits and impairments:  Pain, Obesity, Increased fascial restricitons, Decreased range of motion, Increased  muscle spasms, Hypomobility,  Decreased mobility, Decreased strength, Postural dysfunction, Improper body mechanics, Impaired flexibility  Visit Diagnosis: DDD (degenerative disc disease), lumbar  Joint stiffness of spine  Other symptoms and signs involving the musculoskeletal system     Problem List Patient Active Problem List   Diagnosis Date Noted  . OAB (overactive bladder) 05/28/2020  . Left upper quadrant pain 05/28/2020  . Abdominal distension (gaseous) 05/28/2020  . Epigastric pain 11/26/2019  . Cyst of left kidney 09/10/2019  . Right kidney stone 09/10/2019  . Seasonal allergies 03/18/2019  . Stress reaction 01/01/2019  . DDD (degenerative disc disease), lumbar 01/01/2019  . Dysfunction of left eustachian tube 02/01/2018  . Carpal tunnel syndrome on both sides 11/29/2017  . Pain of right breast 07/07/2017  . Family history of breast cancer in mother 07/07/2017  . Abnormal weight gain 07/07/2017  . Obesity (BMI 30.0-34.9) 01/27/2017  . C7 radiculopathy 12/01/2015  . 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 Lumbar spondylosis 02/19/2014  . Trochanteric bursitis of right hip 11/22/2013  . 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/2012  . Trochanteric bursitis of both hips 11/03/2012  . 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 Herpes simplex 10/23/2012  . Allergic rhinitis 06/09/2011  . Asthma 06/09/2011  . GERD 06/09/2011   Mayer Camel, PTA 08/06/20 5:08 PM  [facility] [place] [address] [place], [place], [address] Phone: [phone]   Fax:  [phone]  Name: Angela Oliver MRN: [id number] Date of Birth: December 23, 1965

## 2020-08-08 ENCOUNTER — Ambulatory Visit: Payer: Federal, State, Local not specified - PPO | Admitting: Physical Therapy

## 2020-08-13 ENCOUNTER — Encounter: Payer: Federal, State, Local not specified - PPO | Admitting: Rehabilitative and Restorative Service Providers"

## 2020-08-14 ENCOUNTER — Ambulatory Visit (INDEPENDENT_AMBULATORY_CARE_PROVIDER_SITE_OTHER): Payer: Federal, State, Local not specified - PPO | Admitting: Physician Assistant

## 2020-08-14 ENCOUNTER — Other Ambulatory Visit: Payer: Self-pay | Admitting: Physician Assistant

## 2020-08-14 VITALS — BP 143/90 | HR 92 | Ht 64.0 in | Wt 203.0 lb

## 2020-08-14 DIAGNOSIS — G8929 Other chronic pain: Secondary | ICD-10-CM

## 2020-08-14 DIAGNOSIS — G43711 Chronic migraine without aura, intractable, with status migrainosus: Secondary | ICD-10-CM

## 2020-08-14 DIAGNOSIS — M545 Low back pain, unspecified: Secondary | ICD-10-CM

## 2020-08-14 DIAGNOSIS — Z1231 Encounter for screening mammogram for malignant neoplasm of breast: Secondary | ICD-10-CM | POA: Diagnosis not present

## 2020-08-14 DIAGNOSIS — M7061 Trochanteric bursitis, right hip: Secondary | ICD-10-CM

## 2020-08-14 NOTE — Progress Notes (Signed)
Subjective:    Patient ID: Angela Oliver, female    DOB: 01/25/1966, 54 y.o.   MRN: [id number]  HPI  Patient is a 54 year old female with intractable chronic migraine, allergic rhinitis, asthma, lumbar spondylosis, lumbar degenerative disc disease, chronic low back pain who presents to the clinic to review some paperwork for disability.  Patient has met with a lawyer and they have formulated some papers.  Patient works for [employer] and they have strict requirements and what medication she can take.  She is struggled with migraines for years and they are worsening and she is not allowed to take the medications to treat this.  She needs me to look up paperwork and sign it if appropriate.  .. Active Ambulatory Problems    Diagnosis Date Noted  . Allergic rhinitis 06/09/2011  . Asthma 06/09/2011  . GERD 06/09/2011  . 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 Herpes simplex 10/23/2012  . 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/2012  . Trochanteric bursitis of both hips 11/03/2012  . Trochanteric bursitis of right hip 11/22/2013  . Lumbar spondylosis 02/19/2014  . 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 C7 radiculopathy 12/01/2015  . Obesity (BMI 30.0-34.9) 01/27/2017  . Pain of right breast 07/07/2017  . Family history of breast cancer in mother 07/07/2017  . Abnormal weight gain 07/07/2017  . Carpal tunnel syndrome on both sides 11/29/2017  . Dysfunction of left eustachian tube 02/01/2018  . Stress reaction 01/01/2019  . DDD (degenerative disc disease), lumbar 01/01/2019  . Seasonal allergies 03/18/2019  . Cyst of left kidney 09/10/2019  . Right kidney stone 09/10/2019  . Epigastric pain 11/26/2019  . OAB (overactive bladder) 05/28/2020  . Left upper quadrant pain 05/28/2020  . Abdominal distension (gaseous) 05/28/2020   Resolved Ambulatory Problems    Diagnosis Date Noted  . RUQ PAIN 06/09/2011  . Migraines 10/23/2012  . Neck muscle spasm  11/03/2012  . Peroneal tendinitis of left lower extremity 12/29/2012  . Left foot pain 11/22/2013  . Foot pain, left 02/19/2014  . Poison ivy dermatitis 07/04/2014  . Acute bronchitis 09/16/2015  . Paresthesia of both hands 12/01/2015  . Calculus of gallbladder without cholecystitis without obstruction 05/03/2016  . Symptomatic cholelithiasis 06/01/2016  . S/P cholecystectomy 07/16/2016  . Low back sprain 07/28/2016   Past Medical History:  Diagnosis Date  . Anxiety   . Arthritis   . Asthma   . Complication of anesthesia   . Family history of adverse reaction to anesthesia   . GERD (gastroesophageal reflux disease)   . Irregular heart beat       Review of Systems See HPI.     Objective:   Physical Exam Vitals reviewed.  Constitutional:      Appearance: Normal appearance.  Cardiovascular:     Rate and Rhythm: Normal rate.  Pulmonary:     Effort: Pulmonary effort is normal.  Neurological:     General: No focal deficit present.     Mental Status: She is alert and oriented to person, place, and time.  Psychiatric:        Mood and Affect: Mood normal.           Assessment & Plan:  Marland KitchenMarland KitchenEirene was seen today for follow-up.  Diagnoses and all orders for this visit:  Intractable chronic migraine without aura and with status migrainosus  Encounter for screening mammogram for malignant neoplasm of breast -     MM 3D SCREEN BREAST BILATERAL -  Ambulatory referral to Obstetrics / Gynecology  Visit for screening mammogram   Patient requests referral for GYN to do Pap smear.  Mammogram ordered today for screening.  I do see where her migraines are not controlled.  She has tried numerous things for treatment.  A lot of the medications she is not able to use if they cause any central nervous system depression.  Triptians/gabapentin/TCA not allowed to use.   Pt has tried:  topamax aimovig Propranolol excedrin migraine  Hx of NSAID induced gastritis.   Pt  continues to have multiple migraines a week that she cannot rescue.  Will try switch to ajovy as well as nurtec for rescue.   Agree with status of migraines right now she should not be able to work unless will can control and/or allowed to take medications.   Suggest patient continue treatment for lumbar spondylosis and lumbar degenerative disc disease with Dr. Darene Lamer.  At this time I do not see any reason why this would provide patient with a disability.  She needs to continue management with Dr. Darene Lamer.  Spent 30 minutes with patient reviewing paperwork and coming up with plan.

## 2020-08-15 ENCOUNTER — Other Ambulatory Visit: Payer: Self-pay

## 2020-08-15 ENCOUNTER — Ambulatory Visit (INDEPENDENT_AMBULATORY_CARE_PROVIDER_SITE_OTHER): Payer: Federal, State, Local not specified - PPO | Admitting: Physical Therapy

## 2020-08-15 ENCOUNTER — Encounter: Payer: Self-pay | Admitting: Physical Therapy

## 2020-08-15 DIAGNOSIS — M2560 Stiffness of unspecified joint, not elsewhere classified: Secondary | ICD-10-CM

## 2020-08-15 DIAGNOSIS — R29898 Other symptoms and signs involving the musculoskeletal system: Secondary | ICD-10-CM | POA: Diagnosis not present

## 2020-08-15 DIAGNOSIS — M5136 Other intervertebral disc degeneration, lumbar region: Secondary | ICD-10-CM

## 2020-08-15 NOTE — Therapy (Signed)
[facility] [place] [address] [place], [place], [address] Phone: [phone]   Fax:  [phone]  Physical Therapy Treatment  Patient Details  Name: Angela Oliver MRN: [id number] Date of Birth: 1966/01/16 Referring Provider (PT): Dr Ilsa Iha    Encounter Date: 08/15/2020   PT End of Session - 08/15/20 1735    Visit Number 12    Number of Visits 20    Date for PT Re-Evaluation 09/11/20    PT Start Time 1405    PT Stop Time 1445    PT Time Calculation (min) 40 min    Activity Tolerance Patient tolerated treatment well    Behavior During Therapy [facility] for tasks assessed/performed           Past Medical History:  Diagnosis Date  . Anxiety   . Arthritis   . Asthma   . Complication of anesthesia    slow to awaken  . Family history of adverse reaction to anesthesia    mother slow to awaken  . GERD (gastroesophageal reflux disease)   . Herpes simplex   . Irregular heart beat    in the past- no palpations- "skipped a beat"  . Migraines   . Seasonal allergies     Past Surgical History:  Procedure Laterality Date  . APPENDECTOMY    . CHOLECYSTECTOMY  06/01/2016   laproscopic   . CHOLECYSTECTOMY N/A 06/01/2016   Procedure: LAPAROSCOPIC CHOLECYSTECTOMY WITH INTRAOPERATIVE CHOLANGIOGRAM, REMOVAL PERITONEAL NODULE ADJACENT TO SIGMOID COLON;  Surgeon: Avel Peace, MD;  Location: [facility] OR;  Service: General;  Laterality: N/A;  . COLONOSCOPY     age 66  . OVARIAN CYST REMOVAL Left    part of ovary- cyst hadf ruptured    There were no vitals filed for this visit.   Subjective Assessment - 08/15/20 1731    Subjective Stiff from getting vaccine on Monday.  She states she has some sores on arms from it. No changes to LBP.).  "I've had a lot going on" stating she hasn't gotten in to pool or done much exercise.    Diagnostic tests degenerative changes through the lumbar spine    Patient Stated Goals try to get some mobility  back and strengthen muscules in the back    Currently in Pain? Yes    Pain Score 6     Pain Location Back    Pain Orientation Lower    Pain Descriptors / Indicators Aching;Dull    Aggravating Factors  lifting bags at work    Pain Relieving Factors ice, heat           Pt seen for aquatic therapy today.  Treatment took place in water 3-3.75 ft in depth at [facility]. Temp of water was 87.  Pt entered/exited the pool via ramp with bilat  Rail independently. Treatment:  Heel raises x 20(no UE support) Calf stretch 20 sec x 2 each leg Rt/Lt ant hip stretch x 20sec x 2 reps each LE Low back stretch with feet on pool wall x 20 sec x 2 reps;   Hip abduction x 10 reps each side 2 sets;   Forward walking (slow speed with core engaged) x 25 m.  Forward marching without/with noodle for support x 70m. Retro walking with pool noodle x 41m. Side step with squat x 25 m Rt/ 25 m Lt.  Prone float with floatation belt and noodle under arms to tuck and plant feet on ground x2 reps; repeated 3 reps without  any floatation aide (improved ability and tolerance).  Supine float with nekdoodle, pool noodle under arms- followed by side to side motion for side stretch, circles CW/CCW for core activation, bilat LE abd/add and bicycle pedaling.  Pt requires buoyancy for support and to offload joints with strengthening exercises. Viscosity of the water is needed for resistance of strengthening; water current perturbations provides challenge to standing balance unsupported, requiring increased core activation.       PT Long Term Goals - 07/31/20 1619      PT LONG TERM GOAL #1   Title I with advanced HEP    Time 6    Period Weeks    Status On-going    Target Date 09/11/20      PT LONG TERM GOAL #2   Title improve FOTO =/< 40% limited    Time 6    Period Weeks    Status On-going    Target Date 09/11/20      PT LONG TERM GOAL #3   Title report =/> 50% reduction of pain in her back with  functional activities and work    Time 6    Period Weeks    Status On-going    Target Date 09/11/20      PT LONG TERM GOAL #4   Title patient reports ability to sleep in her bed for 3-4 hours with minimal to no increase in pain    Time 6    Period Weeks    Status On-going    Target Date 09/11/20      PT LONG TERM GOAL #5   Title increase hip mobilty and lumbar mobilty/ROM allowing patient to perform functional transfers and reaching with minimal increase in pain    Time 6    Period Weeks    Status On-going    Target Date 09/11/20                 Plan - 08/15/20 1732    Clinical Impression Statement Pt completed aquatic exercises with small changes in pain with increased depth of water and controlling how far out LE moves from center.  Pt reported significant pain relief with supine floating.  Continued encouragement given for pt to seek opportunities to exercise in water outside therapy sessions to continue benefits of aquatics.  Pt reported decreased overall stiffness at end of session.    Examination-Activity Limitations Locomotion Level;Transfers;Bend;Sit;Stand;Toileting;Lift;Hygiene/Grooming;Dressing;Continence;Bed Mobility    Rehab Potential Good    PT Frequency 2x / week    PT Duration 6 weeks    PT Treatment/Interventions Patient/family education;ADLs/Self Care Home Management;Cryotherapy;Electrical Stimulation;Iontophoresis 4mg /ml Dexamethasone;Moist Heat;Ultrasound;Therapeutic activities;Therapeutic exercise;Balance training;Neuromuscular re-education;Manual techniques;Dry needling    PT Next Visit Plan continue aquatic therapy and land based therapy to address core stability and lumbar strengthening    PT Home Exercise Plan [email or web address],  (previous one: )           Patient will benefit from skilled therapeutic intervention in order to improve the following deficits and impairments:  Pain, Obesity, Increased fascial restricitons, Decreased range of motion,  Increased muscle spasms, Hypomobility, Decreased mobility, Decreased strength, Postural dysfunction, Improper body mechanics, Impaired flexibility  Visit Diagnosis: DDD (degenerative disc disease), lumbar  Joint stiffness of spine  Other symptoms and signs involving the musculoskeletal system     Problem List Patient Active Problem List   Diagnosis Date Noted  . OAB (overactive bladder) 05/28/2020  . Left upper quadrant pain 05/28/2020  . Abdominal distension (gaseous) 05/28/2020  . Epigastric pain 11/26/2019  .  Cyst of left kidney 09/10/2019  . Right kidney stone 09/10/2019  . Seasonal allergies 03/18/2019  . Stress reaction 01/01/2019  . DDD (degenerative disc disease), lumbar 01/01/2019  . Dysfunction of left eustachian tube 02/01/2018  . Carpal tunnel syndrome on both sides 11/29/2017  . Pain of right breast 07/07/2017  . Family history of breast cancer in mother 07/07/2017  . Abnormal weight gain 07/07/2017  . Obesity (BMI 30.0-34.9) 01/27/2017  . C7 radiculopathy 12/01/2015  . 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 Lumbar spondylosis 02/19/2014  . Trochanteric bursitis of right hip 11/22/2013  . 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/2012  . Trochanteric bursitis of both hips 11/03/2012  . 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 Herpes simplex 10/23/2012  . Allergic rhinitis 06/09/2011  . Asthma 06/09/2011  . GERD 06/09/2011   Mayer Camel, PTA 08/15/20 5:43 PM  [facility] [address] [place], [place], [address] Phone: [phone]   Fax:  [phone]  Name: Angela Oliver MRN: [id number] Date of Birth: 1966-10-14

## 2020-08-19 ENCOUNTER — Encounter: Payer: Self-pay | Admitting: Physician Assistant

## 2020-08-20 ENCOUNTER — Encounter: Payer: Federal, State, Local not specified - PPO | Admitting: Physical Therapy

## 2020-08-20 ENCOUNTER — Ambulatory Visit (INDEPENDENT_AMBULATORY_CARE_PROVIDER_SITE_OTHER): Payer: Federal, State, Local not specified - PPO | Admitting: Physician Assistant

## 2020-08-20 ENCOUNTER — Encounter: Payer: Self-pay | Admitting: Physician Assistant

## 2020-08-20 ENCOUNTER — Other Ambulatory Visit: Payer: Self-pay | Admitting: Physician Assistant

## 2020-08-20 VITALS — BP 132/86 | HR 95

## 2020-08-20 DIAGNOSIS — G43711 Chronic migraine without aura, intractable, with status migrainosus: Secondary | ICD-10-CM

## 2020-08-20 MED ORDER — KETOROLAC TROMETHAMINE 60 MG/2ML IM SOLN
60.0000 mg | Freq: Once | INTRAMUSCULAR | Status: AC
  Administered 2020-08-20: 60 mg via INTRAMUSCULAR

## 2020-08-20 MED ORDER — DEXAMETHASONE SODIUM PHOSPHATE 10 MG/ML IJ SOLN
8.0000 mg | Freq: Once | INTRAMUSCULAR | Status: AC
  Administered 2020-08-20: 8 mg via INTRAMUSCULAR

## 2020-08-20 MED ORDER — NURTEC 75 MG PO TBDP: 1.0000 | ORAL_TABLET | ORAL | 0 refills | Status: DC | PRN

## 2020-08-20 MED ORDER — AJOVY 225 MG/1.5ML ~~LOC~~ SOSY: 225.0000 mg | PREFILLED_SYRINGE | SUBCUTANEOUS | 2 refills | Status: DC

## 2020-08-20 MED ORDER — SUMATRIPTAN SUCCINATE 100 MG PO TABS: 100.0000 mg | ORAL_TABLET | ORAL | 0 refills | Status: DC | PRN

## 2020-08-20 NOTE — Telephone Encounter (Signed)
I did. Aimovig is not working very well for her.

## 2020-08-20 NOTE — Telephone Encounter (Signed)
Ajovy not covered, did you give coupon for this?

## 2020-08-21 ENCOUNTER — Other Ambulatory Visit: Payer: Self-pay

## 2020-08-21 ENCOUNTER — Ambulatory Visit (INDEPENDENT_AMBULATORY_CARE_PROVIDER_SITE_OTHER): Payer: Federal, State, Local not specified - PPO | Admitting: Sports Medicine

## 2020-08-21 ENCOUNTER — Telehealth: Payer: Self-pay | Admitting: Physician Assistant

## 2020-08-21 DIAGNOSIS — M47816 Spondylosis without myelopathy or radiculopathy, lumbar region: Secondary | ICD-10-CM | POA: Diagnosis not present

## 2020-08-21 NOTE — Assessment & Plan Note (Signed)
This is a pleasant 54 year old female, and unfortunately continues to have back pain, MRI did show significant facet arthritis, predominantly left L4-L5 with synovial cyst, due to significant bilateral facet degenerative changes we had initially hoped for a bilateral L4-S1 facet joint injection series with Dr. Laurian Brim, she has not yet been contacted, we are going to proceed with referral to [facility] imaging with hopes of getting these injections done in a rapid fashion. She will continue with her aquatic physical therapy. Return to see me 1 month after injections.

## 2020-08-21 NOTE — Progress Notes (Signed)
    Procedures performed today:    None.  Independent interpretation of notes and tests performed by another provider:   None.  Brief History, Exam, Impression, and Recommendations:    Lumbar spondylosis This is a pleasant 54 year old female, and unfortunately continues to have back pain, MRI did show significant facet arthritis, predominantly left L4-L5 with synovial cyst, due to significant bilateral facet degenerative changes we had initially hoped for a bilateral L4-S1 facet joint injection series with Dr. Laurian Brim, she has not yet been contacted, we are going to proceed with referral to [facility] imaging with hopes of getting these injections done in a rapid fashion. She will continue with her aquatic physical therapy. Return to see me 1 month after injections.    ___________________________________________ [name]. Benjamin Stain, M.D., ABFM., CAQSM. Primary Care and Sports Medicine [facility]  Adjunct Instructor of Family Medicine  [employer]

## 2020-08-21 NOTE — Telephone Encounter (Signed)
Received fax for PA on Nurtec sent through cover my meds waiting on determination. - CF 

## 2020-08-22 ENCOUNTER — Encounter: Payer: Self-pay | Admitting: Physical Therapy

## 2020-08-22 ENCOUNTER — Ambulatory Visit (INDEPENDENT_AMBULATORY_CARE_PROVIDER_SITE_OTHER): Payer: Federal, State, Local not specified - PPO | Admitting: Physical Therapy

## 2020-08-22 ENCOUNTER — Encounter: Payer: Self-pay | Admitting: Physician Assistant

## 2020-08-22 DIAGNOSIS — R29898 Other symptoms and signs involving the musculoskeletal system: Secondary | ICD-10-CM | POA: Diagnosis not present

## 2020-08-22 DIAGNOSIS — M5136 Other intervertebral disc degeneration, lumbar region: Secondary | ICD-10-CM

## 2020-08-22 DIAGNOSIS — M2560 Stiffness of unspecified joint, not elsewhere classified: Secondary | ICD-10-CM

## 2020-08-22 NOTE — Progress Notes (Signed)
Subjective:    Patient ID: Angela Oliver, female    DOB: [birth year], 54 y.o.   MRN: [id number]  HPI  Pt is a 54 yo female with hx of migraines that comes in with migraine that has last 2 days. Pt endorses nausea, light and sound sensitivity and persistent headache. She has been taking excedrin migraine around the clock. She comes in for injection. She is due for aimovig in next week.   .. Active Ambulatory Problems    Diagnosis Date Noted  . Allergic rhinitis 06/09/2011  . Asthma 06/09/2011  . GERD 06/09/2011  . 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 Herpes simplex 10/23/2012  . 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/2012  . Trochanteric bursitis of both hips 11/03/2012  . Trochanteric bursitis of right hip 11/22/2013  . Lumbar spondylosis 02/19/2014  . 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 C7 radiculopathy 12/01/2015  . Obesity (BMI 30.0-34.9) 01/27/2017  . Pain of right breast 07/07/2017  . Family history of breast cancer in mother 07/07/2017  . Abnormal weight gain 07/07/2017  . Carpal tunnel syndrome on both sides 11/29/2017  . Dysfunction of left eustachian tube 02/01/2018  . Stress reaction 01/01/2019  . DDD (degenerative disc disease), lumbar 01/01/2019  . Seasonal allergies 03/18/2019  . Cyst of left kidney 09/10/2019  . Right kidney stone 09/10/2019  . Epigastric pain 11/26/2019  . OAB (overactive bladder) 05/28/2020  . Left upper quadrant pain 05/28/2020  . Abdominal distension (gaseous) 05/28/2020   Resolved Ambulatory Problems    Diagnosis Date Noted  . RUQ PAIN 06/09/2011  . Migraines 10/23/2012  . Neck muscle spasm 11/03/2012  . Peroneal tendinitis of left lower extremity 12/29/2012  . Left foot pain 11/22/2013  . Foot pain, left 02/19/2014  . Poison ivy dermatitis 07/04/2014  . Acute bronchitis 09/16/2015  . Paresthesia of both hands 12/01/2015  . Calculus of gallbladder without cholecystitis without obstruction  05/03/2016  . Symptomatic cholelithiasis 06/01/2016  . S/P cholecystectomy 07/16/2016  . Low back sprain 07/28/2016   Past Medical History:  Diagnosis Date  . Anxiety   . Arthritis   . Asthma   . Complication of anesthesia   . Family history of adverse reaction to anesthesia   . GERD (gastroesophageal reflux disease)   . Irregular heart beat        Review of Systems See HPI.     Objective:   Physical Exam Vitals reviewed.  Constitutional:      Appearance: Normal appearance.  HENT:     Head: Normocephalic.  Cardiovascular:     Rate and Rhythm: Normal rate and regular rhythm.     Pulses: Normal pulses.  Pulmonary:     Effort: Pulmonary effort is normal.  Neurological:     General: No focal deficit present.     Mental Status: She is alert and oriented to person, place, and time.     Cranial Nerves: No cranial nerve deficit.     Motor: No weakness.     Gait: Gait normal.  Psychiatric:        Mood and Affect: Mood normal.           Assessment & Plan:  Marland KitchenMarland KitchenKischa was seen today for migraine.  Diagnoses and all orders for this visit:  Intractable chronic migraine without aura and with status migrainosus -     SUMAtriptan (IMITREX) 100 MG tablet; Take 1 tablet (100 mg total) by mouth every 2 (two) hours as needed for migraine. May repeat  in 2 hours if headache persists or recurs. -     Fremanezumab-vfrm (AJOVY) 225 MG/1.5ML SOSY; Inject 225 mg into the skin every 30 (thirty) days. -     ketorolac (TORADOL) injection 60 mg -     dexamethasone (DECADRON) injection 8 mg   Migraine cocktail given today for rescue.   Samples of nurtec given to try for rescue. imitrex to use if needed. Ajovy and coupon card given to try. Pt is not able to work if she uses imitrex. Note for work given that says she was given a mood altering drug.

## 2020-08-22 NOTE — Therapy (Addendum)
[place], [place], [address] Phone: [phone]   Fax:  [phone]  Physical Therapy Treatment  Patient Details  Name: Angela Oliver MRN: [id number] Date of Birth: 04-30-1966 Referring Provider (PT): Dr Romona Curls    Encounter Date: 08/22/2020   PT End of Session - 08/22/20 1532    Visit Number 13    Number of Visits 20    Date for PT Re-Evaluation 09/11/20    PT Start Time 1230    PT Stop Time 1315    PT Time Calculation (min) 45 min    Activity Tolerance Patient tolerated treatment well    Behavior During Therapy [facility] for tasks assessed/performed           Past Medical History:  Diagnosis Date  . Anxiety   . Arthritis   . Asthma   . Complication of anesthesia    slow to awaken  . Family history of adverse reaction to anesthesia    mother slow to awaken  . GERD (gastroesophageal reflux disease)   . Herpes simplex   . Irregular heart beat    in the past- no palpations- "skipped a beat"  . Migraines   . Seasonal allergies     Past Surgical History:  Procedure Laterality Date  . APPENDECTOMY    . CHOLECYSTECTOMY  06/01/2016   laproscopic   . CHOLECYSTECTOMY N/A 06/01/2016   Procedure: LAPAROSCOPIC CHOLECYSTECTOMY WITH INTRAOPERATIVE CHOLANGIOGRAM, REMOVAL PERITONEAL NODULE ADJACENT TO SIGMOID COLON;  Surgeon: Jackolyn Confer, MD;  Location: [facility];  Service: General;  Laterality: N/A;  . COLONOSCOPY     age 1  . OVARIAN CYST REMOVAL Left    part of ovary- cyst hadf ruptured    There were no vitals filed for this visit.   Subjective Assessment - 08/22/20 1543    Subjective Pt reports she tried to sleep in the bed last night; woke every 30 min and wasn't able to get comfortable.  "I was in so much pain I had to take pain medication this morning. She is scheduled to get injections next week; requests to hold pool therapy.    Diagnostic tests degenerative changes through the lumbar  spine    Patient Stated Goals try to get some mobility back and strengthen muscules in the back    Currently in Pain? Yes    Pain Score 8     Pain Location Back    Pain Orientation Mid;Lower    Pain Descriptors / Indicators Sharp;Constant;Tightness    Aggravating Factors  lifting bags at work    Pain Relieving Factors ice, heat, stretches, pool exercise.           Pt seen for aquatic therapy today.  Treatment took place in water 3-4 ft in depth at [facility]. Temp of water was 87 deg.  Pt entered/exited the pool independently via ramp with bilat rail.  Treatment:   Forward/retro gait with core engagement x 25 meters each.  High knee marching x 20 meters.  Side stepping with arms abdct/ add x 30 meters Rt/Lt R/L calf stretch x 20 sec R/L hip flexor stretch x 20 sec Low back stretch with feet on pool wall x 2 sec,2 reps  Ai Chi postures: Freeing, Enclosing, and soothing -each x 10 reps  SLS with horiz head turns x 30 sec Hip abdct x 10 each side Supine float with nekdoodle, float belt, and noodle supporting feet:  Rt/Lt shoulder abdct/add with core engagement (to  move body across water) x 5 reps each side, Elementary breaststroke arms only x 25 meters with core engaged,  CW/CCW circles with core engaged x 3 each direction, then gentle passive lateral trunk flexion for stretch x 5 reps each side.  Pt requires buoyancy for support and to offload joints with strengthening exercises. Viscosity of the water is needed for resistance of strengthening; water current perturbations provides challenge to standing balance unsupported, requiring increased core activation.      PT Long Term Goals - 07/31/20 1619      PT LONG TERM GOAL #1   Title I with advanced HEP    Time 6    Period Weeks    Status On-going    Target Date 09/11/20      PT LONG TERM GOAL #2   Title improve FOTO =/< 40% limited    Time 6    Period Weeks    Status On-going    Target Date 09/11/20      PT  LONG TERM GOAL #3   Title report =/> 50% reduction of pain in her back with functional activities and work    Time 6    Period Weeks    Status On-going    Target Date 09/11/20      PT LONG TERM GOAL #4   Title patient reports ability to sleep in her bed for 3-4 hours with minimal to no increase in pain    Time 6    Period Weeks    Status On-going    Target Date 09/11/20      PT LONG TERM GOAL #5   Title increase hip mobilty and lumbar mobilty/ROM allowing patient to perform functional transfers and reaching with minimal increase in pain    Time 6    Period Weeks    Status On-going    Target Date 09/11/20                 Plan - 08/22/20 1533    Clinical Impression Statement Pt reported pain reduction in low back during session to 2/10 and elimination of pain between shoulder blades at end of session.  Discussed transition to pool program after discharge from therapy and what exercises to include, along with parameters.  Pt requests to hold therapy until after she receives injection in back; wants to see if that will improve her pain. Encouraged pt to seek pool exercises when cleared by MD to return to water.    Examination-Activity Limitations Locomotion Level;Transfers;Bend;Sit;Stand;Toileting;Lift;Hygiene/Grooming;Dressing;Continence;Bed Mobility    Rehab Potential Good    PT Frequency 2x / week    PT Duration 6 weeks    PT Treatment/Interventions Patient/family education;ADLs/Self Care Home Management;Cryotherapy;Electrical Stimulation;Iontophoresis 4mg /ml Dexamethasone;Moist Heat;Ultrasound;Therapeutic activities;Therapeutic exercise;Balance training;Neuromuscular re-education;Manual techniques;Dry needling    PT Next Visit Plan establish/modify pool routine; assess need for additional visits after next session.    PT [place],  (previous one: [id number])           Patient will benefit from skilled therapeutic intervention in order to improve the  following deficits and impairments:  Pain, Obesity, Increased fascial restricitons, Decreased range of motion, Increased muscle spasms, Hypomobility, Decreased mobility, Decreased strength, Postural dysfunction, Improper body mechanics, Impaired flexibility  Visit Diagnosis: DDD (degenerative disc disease), lumbar  Joint stiffness of spine  Other symptoms and signs involving the musculoskeletal system     Problem List Patient Active Problem List   Diagnosis Date Noted  . OAB (overactive bladder) 05/28/2020  . Left  upper quadrant pain 05/28/2020  . Abdominal distension (gaseous) 05/28/2020  . Epigastric pain 11/26/2019  . Cyst of left kidney 09/10/2019  . Right kidney stone 09/10/2019  . Seasonal allergies 03/18/2019  . Stress reaction 01/01/2019  . DDD (degenerative disc disease), lumbar 01/01/2019  . Dysfunction of left eustachian tube 02/01/2018  . Carpal tunnel syndrome on both sides 11/29/2017  . Pain of right breast 07/07/2017  . Family history of breast cancer in mother 07/07/2017  . Abnormal weight gain 07/07/2017  . Obesity (BMI 30.0-34.9) 01/27/2017  . C7 radiculopathy 12/01/2015  . 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 Lumbar spondylosis 02/19/2014  . Trochanteric bursitis of right hip 11/22/2013  . 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/2012  . Trochanteric bursitis of both hips 11/03/2012  . 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 Herpes simplex 10/23/2012  . Allergic rhinitis 06/09/2011  . Asthma 06/09/2011  . GERD 06/09/2011   Kerin Perna, PTA 08/22/20 3:53 PM  [facility] [place], [place], [address] Phone: [phone]   Fax:  [phone]  Name: Angela Oliver MRN: [id number] Date of Birth: January 23, 1966  PHYSICAL THERAPY DISCHARGE SUMMARY  Visits from Start of Care: 12  Current functional level related to goals / functional  outcomes: See progress note for discharge status    Remaining deficits: Unknown    Education / Equipment: HEP; Aquatics program  Plan: Patient agrees to discharge.  Patient goals were partially met. Patient is being discharged due to                                                     ?????     Completed POC. Patient should continue with independent HEP.   Celyn P. Helene Kelp PT, MPH 02/02/21 12:21 PM

## 2020-08-26 ENCOUNTER — Other Ambulatory Visit: Payer: Self-pay

## 2020-08-26 ENCOUNTER — Ambulatory Visit
Admission: RE | Admit: 2020-08-26 | Discharge: 2020-08-26 | Disposition: A | Discharge status: Home / Self Care | Payer: Federal, State, Local not specified - PPO | Source: Ambulatory Visit | Attending: Sports Medicine | Admitting: Sports Medicine

## 2020-08-26 ENCOUNTER — Other Ambulatory Visit: Payer: Self-pay | Admitting: Sports Medicine

## 2020-08-26 DIAGNOSIS — M4306 Spondylolysis, lumbar region: Secondary | ICD-10-CM

## 2020-08-26 DIAGNOSIS — M47816 Spondylosis without myelopathy or radiculopathy, lumbar region: Secondary | ICD-10-CM

## 2020-08-26 DIAGNOSIS — M47817 Spondylosis without myelopathy or radiculopathy, lumbosacral region: Secondary | ICD-10-CM | POA: Diagnosis not present

## 2020-08-26 MED ORDER — IOPAMIDOL (ISOVUE-M 200) INJECTION 41%
1.0000 mL | Freq: Once | INTRAMUSCULAR | Status: AC
  Administered 2020-08-26: 1 mL via INTRA_ARTICULAR

## 2020-08-26 MED ORDER — METHYLPREDNISOLONE ACETATE 40 MG/ML INJ SUSP (RADIOLOG
120.0000 mg | Freq: Once | INTRAMUSCULAR | Status: AC
  Administered 2020-08-26: 120 mg via INTRA_ARTICULAR

## 2020-08-26 NOTE — Telephone Encounter (Signed)
Can you send nurtec to [facility] they will mail it to her per rep and should be 10dollars. Pt needs to answer phone call though.

## 2020-08-26 NOTE — Discharge Instructions (Signed)

## 2020-08-26 NOTE — Telephone Encounter (Signed)
PA for Nurtec denied. I will place in basket.

## 2020-08-27 ENCOUNTER — Other Ambulatory Visit: Payer: Self-pay | Admitting: Physician Assistant

## 2020-08-27 DIAGNOSIS — N3281 Overactive bladder: Secondary | ICD-10-CM

## 2020-08-27 MED ORDER — NURTEC 75 MG PO TBDP: 1.0000 | ORAL_TABLET | ORAL | 0 refills | Status: DC | PRN

## 2020-08-27 NOTE — Telephone Encounter (Signed)
LMOM letting patient know. 

## 2020-08-27 NOTE — Telephone Encounter (Signed)
RX sent to [facility].

## 2020-08-29 ENCOUNTER — Encounter: Payer: Federal, State, Local not specified - PPO | Admitting: Rehabilitative and Restorative Service Providers"

## 2020-09-17 ENCOUNTER — Other Ambulatory Visit: Payer: Self-pay | Admitting: Physician Assistant

## 2020-09-17 ENCOUNTER — Encounter: Payer: Self-pay | Admitting: Physician Assistant

## 2020-09-17 ENCOUNTER — Ambulatory Visit (INDEPENDENT_AMBULATORY_CARE_PROVIDER_SITE_OTHER): Payer: Federal, State, Local not specified - PPO | Admitting: Physician Assistant

## 2020-09-17 VITALS — BP 144/78 | HR 97 | Ht 64.0 in | Wt 202.0 lb

## 2020-09-17 DIAGNOSIS — G43709 Chronic migraine without aura, not intractable, without status migrainosus: Secondary | ICD-10-CM

## 2020-09-17 DIAGNOSIS — G43711 Chronic migraine without aura, intractable, with status migrainosus: Secondary | ICD-10-CM

## 2020-09-17 MED ORDER — KETOROLAC TROMETHAMINE 60 MG/2ML IM SOLN: 60.0000 mg | Freq: Once | INTRAMUSCULAR | Status: DC

## 2020-09-17 MED ORDER — DEXAMETHASONE SODIUM PHOSPHATE 10 MG/ML IJ SOLN: 8.0000 mg | Freq: Once | INTRAMUSCULAR | Status: DC

## 2020-09-17 MED ORDER — ZOLMITRIPTAN 5 MG PO TABS: 5.0000 mg | ORAL_TABLET | ORAL | 5 refills | Status: DC | PRN

## 2020-09-17 MED ORDER — AJOVY 225 MG/1.5ML ~~LOC~~ SOSY: 225.0000 mg | PREFILLED_SYRINGE | SUBCUTANEOUS | 2 refills | Status: DC

## 2020-09-17 NOTE — Progress Notes (Signed)
Subjective:    Patient ID: Angela Oliver, female    DOB: 05/07/1966, 54 y.o.   MRN: [id number]  HPI  Pt is a 54 yo female with history of migraines who presents to the clinic for follow up and FMLA paperwork filled out.   Pt did not tolerate imitrex well. She had some throat closing sensations. She reports tolerating zomig in the past. nurtec did work and help more than tripan. Last migraine lasted 4 days. Cannot work per work guidelines on [employer]. This migraine was more severe than her usual. Seem to come on faster and last longer. No aura or neurological symptoms. Located left parietal area of brain.   .. 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 status migrainosus 10/02/2014   C7 radiculopathy 12/01/2015   Obesity (BMI 30.0-34.9) 01/27/2017   Pain of right breast 07/07/2017   Family history of breast cancer in mother 07/07/2017   Abnormal weight gain 07/07/2017   Carpal tunnel syndrome on both sides 11/29/2017   Dysfunction of left eustachian tube 02/01/2018   Stress reaction 01/01/2019   DDD (degenerative disc disease), lumbar 01/01/2019   Seasonal allergies 03/18/2019   Cyst of left kidney 09/10/2019   Right kidney stone 09/10/2019   Epigastric pain 11/26/2019   OAB (overactive bladder) 05/28/2020   Left upper quadrant pain 05/28/2020   Abdominal distension (gaseous) 05/28/2020   Resolved Ambulatory Problems    Diagnosis Date Noted   RUQ PAIN 06/09/2011   Migraines 10/23/2012   Neck muscle spasm 11/03/2012   Peroneal tendinitis of left lower extremity 12/29/2012   Left foot pain  11/22/2013   Foot pain, left 02/19/2014   Poison ivy dermatitis 07/04/2014   Acute bronchitis 09/16/2015   Paresthesia of both hands 12/01/2015   Calculus of gallbladder without cholecystitis without obstruction 05/03/2016   Symptomatic cholelithiasis 06/01/2016   S/P cholecystectomy 07/16/2016   Low back sprain 07/28/2016   Past Medical History:  Diagnosis Date   Anxiety    Arthritis    Asthma    Complication of anesthesia    Family history of adverse reaction to anesthesia    GERD (gastroesophageal reflux disease)    Irregular heart beat      Review of Systems  All other systems reviewed and are negative.      Objective:   Physical Exam Cardiovascular:     Rate and Rhythm: Normal rate.  Pulmonary:     Effort: Pulmonary effort is normal.  Psychiatric:        Mood and Affect: Mood normal.           Assessment & Plan:  Marland KitchenMarland KitchenOmaria was seen today for headache.  Diagnoses and all orders for this visit:  Chronic migraine without aura without status migrainosus, not intractable -     Discontinue: ketorolac (TORADOL) injection 60 mg -     Discontinue: dexamethasone (DECADRON) injection 8 mg -     zolmitriptan (ZOMIG) 5 MG tablet; Take 1 tablet (5 mg total) by mouth as needed for migraine. -     Fremanezumab-vfrm (AJOVY) 225 MG/1.5ML SOSY; Inject 225 mg into the skin every 30 (thirty) days. -  MR Brain Wo Contrast   Pt is not in active migraine but did just have one that lasted 4 days. Reaction to imitrex. Added to intolerance list. Try zomig. Per patient has tolerated in past. Continue to use nurteq for rescue seems to be working. I would like to replace Aimovig for Ajovy. Per patient she is saying her insurance will not pay for ajovy. aimovig is running out the last week with more migraines that week. Arnetha Massy has a longer half life which could give her more coverage. Will contact rep to see what we can do to get this approved.   Migraine worsening in  severity and frequency. Ordered MRI.   FMLA paperwork filled out today.   Spent 30 minutes with patient filling out paperwork and coordinating medication change.

## 2020-09-17 NOTE — Patient Instructions (Signed)
Will get MRI

## 2020-09-18 ENCOUNTER — Other Ambulatory Visit: Payer: Self-pay | Admitting: Physician Assistant

## 2020-09-18 DIAGNOSIS — G43711 Chronic migraine without aura, intractable, with status migrainosus: Secondary | ICD-10-CM

## 2020-09-19 ENCOUNTER — Ambulatory Visit (INDEPENDENT_AMBULATORY_CARE_PROVIDER_SITE_OTHER): Payer: Federal, State, Local not specified - PPO | Admitting: Sports Medicine

## 2020-09-19 ENCOUNTER — Encounter: Payer: Self-pay | Admitting: Sports Medicine

## 2020-09-19 DIAGNOSIS — M47816 Spondylosis without myelopathy or radiculopathy, lumbar region: Secondary | ICD-10-CM

## 2020-09-19 NOTE — Progress Notes (Signed)
    Procedures performed today:    None.  Independent interpretation of notes and tests performed by another provider:   None.  Brief History, Exam, Impression, and Recommendations:    Lumbar spondylosis Jasha returns, she is a very pleasant 54 year old female with multifactorial chronic low back pain, her MRI did show a L3-L4 disc protrusion without significant foraminal stenosis, she also had multilevel facet arthritis, L4-S1, with a left L4-L5 synovial cyst. She underwent bilateral L4-S1 facet joint injections, she had greater than 70% relief on the day of the injection and felt really good. Her pain relief decreased over the next several weeks to about 50 total percent. Because of her good relief on the day of the injection this confirms that approximately 70% of her pain is likely coming from her L4-S1 bilateral facets, and the remaining 30% of her pain is likely coming from her L3-L4 disc. I did discuss with her the next steps including facet medial branch blocks and radiofrequency ablation, she is going to look into it but would like to hold off for now. She can request this in the future if needed. Return as needed.    ___________________________________________ [name]. Benjamin Stain, M.D., ABFM., CAQSM. Primary Care and Sports Medicine [facility]  Adjunct Instructor of Family Medicine  [employer]

## 2020-09-19 NOTE — Patient Instructions (Signed)
Radiofrequency Lesioning Radiofrequency lesioning is a procedure that is performed to relieve pain. The procedure is often used for back, neck, or arm pain. Radiofrequency lesioning involves the use of a machine that creates radio waves to make heat. During the procedure, the heat is applied to the nerve that carries the pain signal. The heat damages the nerve and interferes with the pain signal. Pain relief usually starts about 2 weeks after the procedure and lasts for 6 months to 1 year. You will be awake during the procedure. You will need to be able to talk with the health care provider during the procedure. Tell a health care provider about:  Any allergies you have.  All medicines you are taking, including vitamins, herbs, eye drops, creams, and over-the-counter medicines.  Any problems you or family members have had with anesthetic medicines.  Any blood disorders you have.  Any surgeries you have had.  Any medical conditions you have or have had.  Whether you are pregnant or may be pregnant. What are the risks? Generally, this is a safe procedure. However, problems may occur, including:  Pain or soreness at the injection site.  Allergic reaction to medicines given during the procedure.  Bleeding.  Infection at the injection site.  Damage to nerves or blood vessels. What happens before the procedure? Staying hydrated Follow instructions from your health care provider about hydration, which may include:  Up to 2 hours before the procedure - you may continue to drink clear liquids, such as water, clear fruit juice, black coffee, and plain tea. Eating and drinking Follow instructions from your health care provider about eating and drinking, which may include:  8 hours before the procedure - stop eating heavy meals or foods, such as meat, fried foods, or fatty foods.  6 hours before the procedure - stop eating light meals or foods, such as toast or cereal.  6 hours before  the procedure - stop drinking milk or drinks that contain milk.  2 hours before the procedure - stop drinking clear liquids. Medicines Ask your health care provider about:  Changing or stopping your regular medicines. This is especially important if you are taking diabetes medicines or blood thinners.  Taking medicines such as aspirin and ibuprofen. These medicines can thin your blood. Do not take these medicines unless your health care provider tells you to take them.  Taking over-the-counter medicines, vitamins, herbs, and supplements. General instructions  Plan to have someone take you home from the hospital or clinic.  If you will be going home right after the procedure, plan to have someone with you for 24 hours.  Ask your health care provider what steps will be taken to help prevent infection. These may include: ? Removing hair at the procedure site. ? Washing skin with a germ-killing soap. ? Taking antibiotic medicine. What happens during the procedure?   An IV will be inserted into one of your veins.  You will be given one or more of the following: ? A medicine to help you relax (sedative). ? A medicine to numb the area (local anesthetic).  Your health care provider will insert a radiofrequency needle into the area to be treated. This is done with the help of a type of X-ray (fluoroscopy).  A wire that carries the radio waves (electrode) will be put through the radiofrequency needle.  An electrical pulse will be sent through the electrode to verify the correct nerve that is causing your pain. You will feel a tingling sensation,   and you may have muscle twitching.  The tissue around the needle tip will be heated by an electric current that comes from the radiofrequency machine. This will numb the nerves.  The needle will be removed.  A bandage (dressing) will be put on the insertion area. The procedure may vary among health care providers and hospitals. What happens  after the procedure?  Your blood pressure, heart rate, breathing rate, and blood oxygen level will be monitored until you leave the hospital or clinic.  Return to your normal activities as told by your health care provider. Ask your health care provider what activities are safe for you.  Do not drive for 24 hours if you were given a sedative during your procedure. Summary  Radiofrequency lesioning is a procedure that is performed to relieve pain. The procedure is often used for back, neck, or arm pain.  Radiofrequency lesioning involves the use of a machine that creates radio waves to make heat.  Plan to have someone take you home from the hospital or clinic. Do not drive for 24 hours if you were given a sedative during your procedure.  Return to your normal activities as told by your health care provider. Ask your health care provider what activities are safe for you. This information is not intended to replace advice given to you by your health care provider. Make sure you discuss any questions you have with your health care provider. Document Revised: 08/17/2018 Document Reviewed: 08/17/2018 Elsevier Patient Education  2020 Elsevier Inc.  

## 2020-09-19 NOTE — Assessment & Plan Note (Signed)
Angela Oliver returns, she is a very pleasant 54 year old female with multifactorial chronic low back pain, her MRI did show a L3-L4 disc protrusion without significant foraminal stenosis, she also had multilevel facet arthritis, L4-S1, with a left L4-L5 synovial cyst. She underwent bilateral L4-S1 facet joint injections, she had greater than 70% relief on the day of the injection and felt really good. Her pain relief decreased over the next several weeks to about 50 total percent. Because of her good relief on the day of the injection this confirms that approximately 70% of her pain is likely coming from her L4-S1 bilateral facets, and the remaining 30% of her pain is likely coming from her L3-L4 disc. I did discuss with her the next steps including facet medial branch blocks and radiofrequency ablation, she is going to look into it but would like to hold off for now. She can request this in the future if needed. Return as needed.

## 2020-09-21 ENCOUNTER — Encounter: Payer: Self-pay | Admitting: Physician Assistant

## 2020-09-22 ENCOUNTER — Encounter: Payer: Federal, State, Local not specified - PPO | Admitting: Obstetrics & Gynecology

## 2020-09-29 ENCOUNTER — Ambulatory Visit (INDEPENDENT_AMBULATORY_CARE_PROVIDER_SITE_OTHER): Payer: Federal, State, Local not specified - PPO

## 2020-09-29 ENCOUNTER — Other Ambulatory Visit: Payer: Self-pay

## 2020-09-29 DIAGNOSIS — G43709 Chronic migraine without aura, not intractable, without status migrainosus: Secondary | ICD-10-CM | POA: Diagnosis not present

## 2020-09-30 ENCOUNTER — Encounter: Payer: Self-pay | Admitting: Physician Assistant

## 2020-09-30 ENCOUNTER — Other Ambulatory Visit: Payer: Self-pay | Admitting: Neurology

## 2020-09-30 DIAGNOSIS — R9389 Abnormal findings on diagnostic imaging of other specified body structures: Secondary | ICD-10-CM

## 2020-09-30 DIAGNOSIS — R9089 Other abnormal findings on diagnostic imaging of central nervous system: Secondary | ICD-10-CM | POA: Insufficient documentation

## 2020-09-30 DIAGNOSIS — G43709 Chronic migraine without aura, not intractable, without status migrainosus: Secondary | ICD-10-CM

## 2020-09-30 NOTE — Progress Notes (Signed)
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 It could be causing your headaches to worsen. I would like for you to see neurology. Do you have any preference?

## 2020-09-30 NOTE — Telephone Encounter (Signed)
Referral placed to Dr. Everlena Cooper.

## 2020-10-03 NOTE — Telephone Encounter (Signed)
I called [facility] and they are going to send referral back to the provider to be reviewed as Urgent if the provider agrees they will schedule sooner. - CF

## 2020-10-03 NOTE — Telephone Encounter (Signed)
Angela Oliver called back from [facility] and stated that the provider reviewed the referral again and stated the mass on the MRI is benign so therefore the referral is not urgent.  She said if we want we can send the referral somewhere else but they do not have an opening until Jan like they offered patient. - CF

## 2020-10-22 ENCOUNTER — Other Ambulatory Visit: Payer: Self-pay | Admitting: Physician Assistant

## 2020-10-22 DIAGNOSIS — G43709 Chronic migraine without aura, not intractable, without status migrainosus: Secondary | ICD-10-CM

## 2020-10-22 NOTE — Telephone Encounter (Signed)
PA

## 2020-10-23 NOTE — Telephone Encounter (Signed)
[facility] - please see if another practice can get in sooner.

## 2020-10-24 ENCOUNTER — Encounter: Payer: Self-pay | Admitting: Physician Assistant

## 2020-10-24 ENCOUNTER — Ambulatory Visit (INDEPENDENT_AMBULATORY_CARE_PROVIDER_SITE_OTHER): Payer: Federal, State, Local not specified - PPO | Admitting: Physician Assistant

## 2020-10-24 VITALS — BP 153/73 | HR 98 | Ht 64.0 in | Wt 210.0 lb

## 2020-10-24 DIAGNOSIS — G43709 Chronic migraine without aura, not intractable, without status migrainosus: Secondary | ICD-10-CM

## 2020-10-24 DIAGNOSIS — M7062 Trochanteric bursitis, left hip: Secondary | ICD-10-CM | POA: Diagnosis not present

## 2020-10-24 DIAGNOSIS — M7061 Trochanteric bursitis, right hip: Secondary | ICD-10-CM | POA: Diagnosis not present

## 2020-10-24 MED ORDER — NURTEC 75 MG PO TBDP: 1.0000 | ORAL_TABLET | ORAL | 2 refills | Status: DC | PRN

## 2020-10-24 NOTE — Progress Notes (Signed)
Subjective:    Patient ID: Angela Oliver, female    DOB: 17-Jun-1966, 54 y.o.   MRN: [id number]  HPI  Pt is a 54 yo female with chronic migraines, chronic low back pain and hx of hip bursitis who presents to the clinic with bilateral hip pain.   Patient does feel like her hip pain is an exacerbation of her bursitis as she has had before.  Unclear of why it is worsening currently.  Not done anything to make better.  On same regimen for how she treats her lumbar spondylosis.  Pain is definitely worse with range of motion of hips.  Patient continues to be able to bear weight.  Nurtec is helping a lot with her migraine rescue.  She would like to be able to try it for prevention at some point. She is currently on aimovig for prevention. She is having trouble getting nurtec filled.   .. Active Ambulatory Problems    Diagnosis Date Noted  . Allergic rhinitis 06/09/2011  . Asthma 06/09/2011  . GERD 06/09/2011  . 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 Herpes simplex 10/23/2012  . 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/2012  . Trochanteric bursitis of both hips 11/03/2012  . Trochanteric bursitis of right hip 11/22/2013  . Lumbar spondylosis 02/19/2014  . C7 radiculopathy 12/01/2015  . Obesity (BMI 30.0-34.9) 01/27/2017  . Pain of right breast 07/07/2017  . Family history of breast cancer in mother 07/07/2017  . Abnormal weight gain 07/07/2017  . Carpal tunnel syndrome on both sides 11/29/2017  . Dysfunction of left eustachian tube 02/01/2018  . Stress reaction 01/01/2019  . DDD (degenerative disc disease), lumbar 01/01/2019  . Seasonal allergies 03/18/2019  . Cyst of left kidney 09/10/2019  . Right kidney stone 09/10/2019  . Epigastric pain 11/26/2019  . OAB (overactive bladder) 05/28/2020  . Left upper quadrant pain 05/28/2020  . Abdominal distension (gaseous) 05/28/2020  . Abnormal brain MRI 09/30/2020   Resolved Ambulatory Problems    Diagnosis Date Noted  . RUQ PAIN 06/09/2011  .  Migraines 10/23/2012  . Neck muscle spasm 11/03/2012  . Peroneal tendinitis of left lower extremity 12/29/2012  . Left foot pain 11/22/2013  . Foot pain, left 02/19/2014  . Poison ivy dermatitis 07/04/2014  . 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 Acute bronchitis 09/16/2015  . Paresthesia of both hands 12/01/2015  . Calculus of gallbladder without cholecystitis without obstruction 05/03/2016  . Symptomatic cholelithiasis 06/01/2016  . S/P cholecystectomy 07/16/2016  . Low back sprain 07/28/2016   Past Medical History:  Diagnosis Date  . Anxiety   . Arthritis   . Asthma   . Complication of anesthesia   . Family history of adverse reaction to anesthesia   . GERD (gastroesophageal reflux disease)   . Irregular heart beat        Review of Systems See HPI.     Objective:   Physical Exam Vitals reviewed.  Constitutional:      Appearance: Normal appearance.  Cardiovascular:     Rate and Rhythm: Normal rate and regular rhythm.     Pulses: Normal pulses.  Pulmonary:     Effort: Pulmonary effort is normal.     Breath sounds: Normal breath sounds.  Musculoskeletal:     Comments: Bilateral greater trochanter tenderness to palpation. Limited ROM of bilateral hips due to pain.   Neurological:     General: No focal deficit present.     Mental Status: She is alert and oriented  to person, place, and time.  Psychiatric:        Mood and Affect: Mood normal.           Assessment & Plan:  Marland KitchenMarland KitchenShellsea was seen today for hip pain.  Diagnoses and all orders for this visit:  Trochanteric bursitis of both hips  Chronic migraine without aura without status migrainosus, not intractable -     Rimegepant Sulfate (NURTEC) 75 MG TBDP; Take 1 tablet by mouth as needed. For migraine rescue.   Sent Nurtec to [employer] to see if we can get this approved.  It is working really well for rescue.  Bilateral hip bursa injections given  today. Discussed importance of icing and range of motion exercises and stretching.  Follow-up with Dr. Karie Schwalbe or myself as needed.  Continue ongoing treatment plan for chronic low back pain.  Aspiration/Injection Procedure Note Estoria Geary [id number] [birth year]  Procedure: Injection Indications: pain  Procedure Details Consent: Risks of procedure as well as the alternatives and risks of each were explained to the (patient/caregiver).  Consent for procedure obtained. Time Out: Verified patient identification, verified procedure, site/side was marked, verified correct patient position, special equipment/implants available, medications/allergies/relevent history reviewed, required imaging and test results available.  Performed   Local Anesthesia Used:Ethyl Chloride Spray Amount of Fluid Aspirated: none  Injected 40mg  of depo medrol and 1 percent lidocaine 69mL into both hip bursas.   A sterile dressing was applied.  Patient did tolerate procedure well. Estimated blood loss: NONE 10m

## 2020-10-24 NOTE — Patient Instructions (Addendum)
[id number] [facility]  Hip Bursitis  Hip bursitis is swelling of a fluid-filled sac (bursa) in your hip joint. This swelling (inflammation) can be painful. This condition may come and go over time. What are the causes?  Injury to the hip.  Overuse of the muscles that surround the hip joint.  An earlier injury or surgery of the hip.  Arthritis or gout.  Diabetes.  Thyroid disease.  Infection.  In some cases, the cause may not be known. What are the signs or symptoms?  Mild or moderate pain in the hip area. Pain may get worse with movement.  Tenderness and swelling of the hip, especially on the outer side of the hip.  In rare cases, the bursa may become infected. This may cause: ? A fever. ? Warmth and redness in the area. Symptoms may come and go. How is this treated? This condition is treated by resting, icing, applying pressure (compression), and raising (elevating) the injured area. You may hear this called the RICE treatment. Treatment may also include:  Using crutches.  Draining fluid out of the bursa to help relieve swelling.  Giving a shot of (injecting) medicine that helps to reduce swelling (cortisone).  Other medicines if the bursa is infected. Follow these instructions at home: Managing pain, stiffness, and swelling   If told, put ice on the painful area. ? Put ice in a plastic bag. ? Place a towel between your skin and the bag. ? Leave the ice on for 20 minutes, 2-3 times a day. ? Raise (elevate) your hip above the level of your heart as much as you can without pain. To do this, try putting a pillow under your hips while you lie down. Stop if this causes pain. Activity  Return to your normal activities as told by your doctor. Ask your doctor what activities are safe for you.  Rest and protect your hip as much as you can until you feel better. General instructions  Take over-the-counter and prescription medicines only as told by your  doctor.  Wear wraps that put pressure on your hip (compression wraps) only as told by your doctor.  Do not use your hip to support your body weight until your doctor says that you can.  Use crutches as told by your doctor.  Gently rub and stretch your injured area as often as is comfortable.  Keep all follow-up visits as told by your doctor. This is important. How is this prevented?  Exercise regularly, as told by your doctor.  Warm up and stretch before being active.  Cool down and stretch after being active.  Avoid activities that bother your hip or cause pain.  Avoid sitting down for long periods at a time. Contact a doctor if:  You have a fever.  You get new symptoms.  You have trouble walking.  You have trouble doing everyday activities.  You have pain that gets worse.  You have pain that does not get better with medicine.  You get red skin on your hip area.  You get a feeling of warmth in your hip area. Get help right away if:  You cannot move your hip.  You have very bad pain. Summary  Hip bursitis is swelling of a fluid-filled sac (bursa) in your hip.  Hip bursitis can be painful.  Symptoms often come and go over time.  This condition is treated with rest, ice, compression, elevation, and medicines. This information is not intended to replace advice given to you by  your health care provider. Make sure you discuss any questions you have with your health care provider. Document Revised: 08/07/2018 Document Reviewed: 08/07/2018 Elsevier Patient Education  2020 [employer].

## 2020-10-27 ENCOUNTER — Encounter: Payer: Self-pay | Admitting: Physician Assistant

## 2020-11-09 ENCOUNTER — Encounter (HOSPITAL_BASED_OUTPATIENT_CLINIC_OR_DEPARTMENT_OTHER): Payer: Self-pay | Admitting: Emergency Medicine

## 2020-11-09 ENCOUNTER — Emergency Department (HOSPITAL_BASED_OUTPATIENT_CLINIC_OR_DEPARTMENT_OTHER)
Admission: EM | Admit: 2020-11-09 | Discharge: 2020-11-09 | Disposition: A | Discharge status: Home / Self Care | Payer: Federal, State, Local not specified - PPO | Attending: Emergency Medicine | Admitting: Emergency Medicine

## 2020-11-09 ENCOUNTER — Other Ambulatory Visit: Payer: Self-pay

## 2020-11-09 DIAGNOSIS — Z7951 Long term (current) use of inhaled steroids: Secondary | ICD-10-CM | POA: Diagnosis not present

## 2020-11-09 DIAGNOSIS — J45909 Unspecified asthma, uncomplicated: Secondary | ICD-10-CM | POA: Insufficient documentation

## 2020-11-09 DIAGNOSIS — R519 Headache, unspecified: Secondary | ICD-10-CM | POA: Diagnosis not present

## 2020-11-09 DIAGNOSIS — H5319 Other subjective visual disturbances: Secondary | ICD-10-CM | POA: Insufficient documentation

## 2020-11-09 DIAGNOSIS — G43909 Migraine, unspecified, not intractable, without status migrainosus: Secondary | ICD-10-CM | POA: Diagnosis not present

## 2020-11-09 DIAGNOSIS — G43901 Migraine, unspecified, not intractable, with status migrainosus: Secondary | ICD-10-CM | POA: Diagnosis not present

## 2020-11-09 DIAGNOSIS — Z85841 Personal history of malignant neoplasm of brain: Secondary | ICD-10-CM | POA: Insufficient documentation

## 2020-11-09 MED ORDER — DIPHENHYDRAMINE HCL 50 MG/ML IJ SOLN
25.0000 mg | Freq: Once | INTRAMUSCULAR | Status: AC
  Administered 2020-11-09: 25 mg via INTRAVENOUS
  Filled 2020-11-09: qty 1

## 2020-11-09 MED ORDER — KETOROLAC TROMETHAMINE 30 MG/ML IJ SOLN
30.0000 mg | Freq: Once | INTRAMUSCULAR | Status: AC
  Administered 2020-11-09: 30 mg via INTRAVENOUS
  Filled 2020-11-09: qty 1

## 2020-11-09 MED ORDER — SODIUM CHLORIDE 0.9 % IV BOLUS
1000.0000 mL | Freq: Once | INTRAVENOUS | Status: AC
  Administered 2020-11-09: 1000 mL via INTRAVENOUS

## 2020-11-09 MED ORDER — PROMETHAZINE HCL 25 MG/ML IJ SOLN
25.0000 mg | Freq: Once | INTRAMUSCULAR | Status: AC
  Administered 2020-11-09: 25 mg via INTRAVENOUS
  Filled 2020-11-09: qty 1

## 2020-11-09 MED ORDER — DEXAMETHASONE SODIUM PHOSPHATE 10 MG/ML IJ SOLN
10.0000 mg | Freq: Once | INTRAMUSCULAR | Status: AC
  Administered 2020-11-09: 10 mg via INTRAVENOUS
  Filled 2020-11-09: qty 1

## 2020-11-09 NOTE — ED Provider Notes (Signed)
[facility] EMERGENCY DEPARTMENT Provider Note   CSN: [id number] Arrival date & time: 11/09/20  1436     History Chief Complaint  Patient presents with   Migraine    Angela Oliver is a 54 y.o. female.  HPI Patient presents with headache.  Throbbing.  Typical migraine for her.  Began around 5 AM today.  No relief with Zofran or zolmitriptan.  Has had migraines since she was been 54 years old.  Gets nauseous with them.  Does get photophobia.  States that she recently found out that she has a tumor in her brain.  Reviewing records appears to be a either calcified meningioma or possibly just bony outcropping from the skull.  It is calcified.  No fevers or chills.  No head injury.  Patient is on monthly injections for her migraines.  States that they only last 20 days however and she tends to flareup after that.    Past Medical History:  Diagnosis Date   Anxiety    Arthritis    Asthma    Complication of anesthesia    slow to awaken   Family history of adverse reaction to anesthesia    mother slow to awaken   GERD (gastroesophageal reflux disease)    Herpes simplex    Irregular heart beat    in the past- no palpations- "skipped a beat"   Migraines    Seasonal allergies     Patient Active Problem List   Diagnosis Date Noted   Abnormal brain MRI 09/30/2020   OAB (overactive bladder) 05/28/2020   Left upper quadrant pain 05/28/2020   Abdominal distension (gaseous) 05/28/2020   Epigastric pain 11/26/2019   Cyst of left kidney 09/10/2019   Right kidney stone 09/10/2019   Seasonal allergies 03/18/2019   Stress reaction 01/01/2019   DDD (degenerative disc disease), lumbar 01/01/2019   Dysfunction of left eustachian tube 02/01/2018   Carpal tunnel syndrome on both sides 11/29/2017   Pain of right breast 07/07/2017   Family history of breast cancer in mother 07/07/2017   Abnormal weight gain 07/07/2017   Obesity (BMI 30.0-34.9) 01/27/2017    C7 radiculopathy 12/01/2015   Lumbar spondylosis 02/19/2014   Trochanteric bursitis of right hip 11/22/2013   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/2012   Trochanteric bursitis of both hips 11/03/2012   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   Herpes simplex 10/23/2012   Allergic rhinitis 06/09/2011   Asthma 06/09/2011   GERD 06/09/2011    Past Surgical History:  Procedure Laterality Date   APPENDECTOMY     CHOLECYSTECTOMY  06/01/2016   laproscopic    CHOLECYSTECTOMY N/A 06/01/2016   Procedure: LAPAROSCOPIC CHOLECYSTECTOMY WITH INTRAOPERATIVE CHOLANGIOGRAM, REMOVAL PERITONEAL NODULE ADJACENT TO SIGMOID COLON;  Surgeon: Avel Peace, MD;  Location[facility] OR;  Service: General;  Laterality: N/A;   COLONOSCOPY     age 50   OVARIAN CYST REMOVAL Left    part of ovary- cyst hadf ruptured     OB History   No obstetric history on file.     Family History  Problem Relation Age of Onset   Cancer Mother        breast cancer   Alcohol abuse Father    Diabetes Father    Hyperlipidemia Father    Asthma Other     Social History   Tobacco Use   Smoking status: Never Smoker   Smokeless tobacco: Never Used  Vaping Use   Vaping Use: Never used  Substance Use Topics   Alcohol use: No  Drug use: No    Home Medications Prior to Admission medications   Medication Sig Start Date End Date Taking? Authorizing Provider  acyclovir (ZOVIRAX) 400 MG tablet Take 1 tablet (400 mg total) by mouth 2 (two) times daily. Patient taking differently: Take 400 mg by mouth 2 (two) times daily as needed.  11/23/19   Breeback, Jade L, PA-C  albuterol (PROVENTIL HFA;VENTOLIN HFA) 108 (90 Base) MCG/ACT inhaler Inhale 2 puffs into the lungs every 6 (six) hours as needed for wheezing. 03/16/19   Breeback, Jade L, PA-C  Albuterol Sulfate (PROAIR RESPICLICK) 108 (90 Base) MCG/ACT AEPB Inhale 2 puffs into the lungs every 4 (four) hours as needed (shortness of breath/ wheezing). Patient  taking differently: Inhale 2 puffs into the lungs in the morning and at bedtime.  01/23/18   Carlis Stable, PA-C  beclomethasone (QVAR) 80 MCG/ACT inhaler Inhale 2 puffs into the lungs 2 (two) times daily. 03/16/19   Breeback, Jade L, PA-C  celecoxib (CELEBREX) 200 MG capsule TAKE 1 TO 2 TABLETS BY MOUTH DAILY AS NEEDED FOR PAIN. 08/14/20   Tandy Gaw L, PA-C  cetirizine (ZYRTEC) 10 MG tablet Take 1 tablet (10 mg total) by mouth daily. 07/12/18   Breeback, Jade L, PA-C  DULoxetine (CYMBALTA) 30 MG capsule TAKE 1 CAPSULE BY MOUTH EVERY DAY 04/30/20   Breeback, Jade L, PA-C  Erenumab-aooe (AIMOVIG) 140 MG/ML SOAJ Inject 140 mg into the skin every 30 (thirty) days. 02/25/20   Breeback, Jade L, PA-C  Fremanezumab-vfrm (AJOVY) 225 MG/1.5ML SOSY Inject 225 mg into the skin every 30 (thirty) days. 09/17/20   Breeback, Lonna Cobb, PA-C  HYDROcodone-acetaminophen (NORCO/VICODIN) 5-325 MG tablet Take 1 tablet by mouth daily as needed for moderate pain. 07/21/20   Monica Becton, MD  montelukast (SINGULAIR) 10 MG tablet TAKE 1 TABLET BY MOUTH EVERY DAY 01/28/20   Breeback, Jade L, PA-C  omeprazole (PRILOSEC) 40 MG capsule TAKE 1 CAPSULE BY MOUTH EVERY DAY 08/06/20   Breeback, Jade L, PA-C  oxybutynin (DITROPAN-XL) 5 MG 24 hr tablet TAKE 1 TABLET BY MOUTH EVERY DAY 08/27/20   Breeback, Jade L, PA-C  Pyridoxine HCl (VITAMIN B-6 PO) Take 1 tablet by mouth daily.    [provider]  Rimegepant Sulfate (NURTEC) 75 MG TBDP Take 1 tablet by mouth as needed. For migraine rescue. 10/24/20   Breeback, Jade L, PA-C  sucralfate (CARAFATE) 1 g tablet TAKE 1 TABLET 4 TIMES A DAY Patient taking differently: Take 1 g by mouth daily as needed.  12/13/19   Breeback, Jade L, PA-C  zolmitriptan (ZOMIG) 5 MG tablet Take 1 tablet (5 mg total) by mouth as needed for migraine. 09/17/20   Breeback, Lesly Rubenstein L, PA-C    Allergies    Sulfa antibiotics, Topamax [topiramate], and Sumatriptan  Review of Systems   Review of  Systems  Constitutional: Negative for appetite change.  HENT: Negative for congestion.   Eyes: Positive for photophobia. Negative for visual disturbance.  Respiratory: Negative for shortness of breath.   Gastrointestinal: Positive for nausea. Negative for vomiting.  Genitourinary: Negative for flank pain.  Musculoskeletal: Negative for back pain.  Skin: Negative for rash.  Neurological: Positive for headaches.  Psychiatric/Behavioral: Negative for confusion.    Physical Exam Updated Vital Signs BP (!) 150/70 (BP Location: Left Arm)    Pulse 67    Temp 98.6 F (37 C) (Oral)    Resp 14    Ht 5\' 4"  (1.626 m)    Wt 95.3 kg  LMP 04/15/2017 (Exact Date)    SpO2 99%    BMI 36.05 kg/m   Physical Exam Vitals and nursing note reviewed.  HENT:     Head: Atraumatic.  Eyes:     Comments: Eyes are held closed due to photophobia.  Pulmonary:     Effort: Pulmonary effort is normal.  Abdominal:     Tenderness: There is no abdominal tenderness.  Musculoskeletal:        General: No tenderness.     Cervical back: Neck supple.  Skin:    General: Skin is warm.     Capillary Refill: Capillary refill takes less than 2 seconds.  Neurological:     Mental Status: She is alert and oriented to person, place, and time.  Psychiatric:        Mood and Affect: Mood normal.     ED Results / Procedures / Treatments   Labs (all labs ordered are listed, but only abnormal results are displayed) Labs Reviewed - No data to display  EKG None  Radiology No results found.  Procedures Procedures (including critical care time)  Medications Ordered in ED Medications  promethazine (PHENERGAN) injection 25 mg (25 mg Intravenous Given 11/09/20 1543)  ketorolac (TORADOL) 30 MG/ML injection 30 mg (30 mg Intravenous Given 11/09/20 1542)  dexamethasone (DECADRON) injection 10 mg (10 mg Intravenous Given 11/09/20 1543)  sodium chloride 0.9 % bolus 1,000 mL ( Intravenous Stopped 11/09/20 1550)   diphenhydrAMINE (BENADRYL) injection 25 mg (25 mg Intravenous Given 11/09/20 1654)    ED Course  I have reviewed the triage vital signs and the nursing notes.  Pertinent labs & imaging results that were available during my care of the patient were reviewed by me and considered in my medical decision making (see chart for details).    MDM Rules/Calculators/A&P                         Patient with history of migraine headaches.  1 began earlier today.  States typical to prior migraines.  Unrelieved by treatment at home.  Do not think he needs much work-up.  Has been previously and recently imaged.  Does have meningioma I think is unlikely causing the headaches.  Treated with migraine cocktail.  Did get some akathisia after the Phenergan.  Treated with Benadryl.  Feels better.  Headache improved.  Discharge home with outpatient follow-up. Final Clinical Impression(s) / ED Diagnoses Final diagnoses:  Migraine with status migrainosus, not intractable, unspecified migraine type    Rx / DC Orders ED Discharge Orders    None       Benjiman Core, MD 11/09/20 1743

## 2020-11-09 NOTE — ED Triage Notes (Addendum)
Pt c/o migraine onset 5 am today that has increased at 11:00 am. Pt reports nausea, took zofran at 11:00.  Pt took zolmitriptan abround 11:00 am. Pt recently found out she has a tumor in her brain.

## 2020-11-09 NOTE — ED Notes (Signed)
ED Provider at bedside. 

## 2020-11-09 NOTE — ED Notes (Signed)
Side rails x 2 up, bed in lowest position, cont POX with int NBP implemented

## 2020-11-09 NOTE — ED Notes (Signed)
Pt reports pain improved but feels "jittery". EDP made aware

## 2020-11-12 NOTE — Telephone Encounter (Signed)
I sent referral to [facility] on 11/16 to see if they could see patient sooner. - CF

## 2020-11-13 ENCOUNTER — Telehealth: Payer: Self-pay | Admitting: Neurology

## 2020-11-13 NOTE — Telephone Encounter (Signed)
Prior Authorization for Nurtec submitted via covermymeds. Awaiting response. Your information has been submitted to [employer]. To check for an updated outcome later, reopen this PA request from your dashboard.  If [employer] has not responded to your request within 24 hours, contact [employer] at [phone]. If you think there may be a problem with your PA request, use our live chat feature at the bottom right.

## 2020-12-04 NOTE — Telephone Encounter (Signed)
Response from Covermymeds:  This letter is to inform you that we did not approve your recent Prior Approval request for Nurtec ODT 75mg  (rimegepant).

## 2020-12-08 NOTE — Telephone Encounter (Signed)
Error

## 2020-12-11 ENCOUNTER — Encounter: Payer: Self-pay | Admitting: Physician Assistant

## 2020-12-18 ENCOUNTER — Encounter: Payer: Self-pay | Admitting: Physician Assistant

## 2020-12-18 ENCOUNTER — Other Ambulatory Visit: Payer: Self-pay | Admitting: Sports Medicine

## 2020-12-18 DIAGNOSIS — M47816 Spondylosis without myelopathy or radiculopathy, lumbar region: Secondary | ICD-10-CM

## 2020-12-18 NOTE — Telephone Encounter (Signed)
Spoke with Lynden Ang at [facility], she just got the order and will contact the patient soon to schedule. My chart message sent to patient regarding readiness or letter and that she would be receiving a call about scheduling injection.

## 2020-12-18 NOTE — Telephone Encounter (Signed)
Letter written, patient may come pick this up, also please contact [facility] imaging for scheduling of facet ablations

## 2020-12-26 ENCOUNTER — Telehealth: Payer: Self-pay | Admitting: Physician Assistant

## 2020-12-26 NOTE — Telephone Encounter (Signed)
Patient called and stated that she no longer needs the note that she originally requested. They gave her an extension period until 01/26/21 so she said she will call later to schedule an appt with PCP for all of this. AM

## 2020-12-29 ENCOUNTER — Other Ambulatory Visit: Payer: Self-pay | Admitting: *Deleted

## 2020-12-29 NOTE — Progress Notes (Signed)
error 

## 2021-01-01 ENCOUNTER — Other Ambulatory Visit: Payer: Self-pay | Admitting: Sports Medicine

## 2021-01-01 ENCOUNTER — Ambulatory Visit
Admission: RE | Admit: 2021-01-01 | Discharge: 2021-01-01 | Disposition: A | Discharge status: Home / Self Care | Payer: Federal, State, Local not specified - PPO | Source: Ambulatory Visit | Attending: Sports Medicine | Admitting: Sports Medicine

## 2021-01-01 ENCOUNTER — Other Ambulatory Visit: Payer: Self-pay

## 2021-01-01 DIAGNOSIS — M545 Low back pain, unspecified: Secondary | ICD-10-CM | POA: Diagnosis not present

## 2021-01-01 DIAGNOSIS — M47816 Spondylosis without myelopathy or radiculopathy, lumbar region: Secondary | ICD-10-CM

## 2021-01-01 NOTE — Discharge Instructions (Signed)

## 2021-01-07 ENCOUNTER — Other Ambulatory Visit: Payer: Self-pay | Admitting: Sports Medicine

## 2021-01-07 ENCOUNTER — Telehealth: Payer: Self-pay | Admitting: Neurology

## 2021-01-07 ENCOUNTER — Ambulatory Visit
Admission: RE | Admit: 2021-01-07 | Discharge: 2021-01-07 | Disposition: A | Discharge status: Home / Self Care | Payer: Federal, State, Local not specified - PPO | Source: Ambulatory Visit | Attending: Sports Medicine | Admitting: Sports Medicine

## 2021-01-07 ENCOUNTER — Other Ambulatory Visit: Payer: Self-pay

## 2021-01-07 DIAGNOSIS — M545 Low back pain, unspecified: Secondary | ICD-10-CM | POA: Diagnosis not present

## 2021-01-07 DIAGNOSIS — M47816 Spondylosis without myelopathy or radiculopathy, lumbar region: Secondary | ICD-10-CM

## 2021-01-07 MED ORDER — METHYLPREDNISOLONE ACETATE 40 MG/ML INJ SUSP (RADIOLOG
120.0000 mg | Freq: Once | INTRAMUSCULAR | Status: AC
  Administered 2021-01-07: 120 mg via INTRA_ARTICULAR

## 2021-01-07 MED ORDER — KETOROLAC TROMETHAMINE 30 MG/ML IJ SOLN
30.0000 mg | Freq: Once | INTRAMUSCULAR | Status: AC
  Administered 2021-01-07: 30 mg via INTRAVENOUS

## 2021-01-07 MED ORDER — FENTANYL CITRATE (PF) 100 MCG/2ML IJ SOLN
25.0000 ug | INTRAMUSCULAR | Status: DC | PRN
  Administered 2021-01-07 (×3): 50 ug via INTRAVENOUS

## 2021-01-07 MED ORDER — MIDAZOLAM HCL 2 MG/2ML IJ SOLN
1.0000 mg | INTRAMUSCULAR | Status: DC | PRN
  Administered 2021-01-07 (×4): 0.5 mg via INTRAVENOUS

## 2021-01-07 MED ORDER — SODIUM CHLORIDE 0.9 % IV SOLN: INTRAVENOUS | Status: DC

## 2021-01-07 NOTE — Progress Notes (Signed)
Pt back in nursing area for recovery.  Alert and oriented and appropriate. Lying flat on back on stretcher. No c/o pain. Vital signs stable.  Iv saline locked.  Still patent.

## 2021-01-07 NOTE — Progress Notes (Signed)
Sitting up eating a cracker and drinking.  Drowsy,

## 2021-01-07 NOTE — Discharge Instructions (Signed)
Radio Frequency Ablation Post Procedure Discharge Instructions ° °1. May resume a regular diet and any medications that you routinely take (including pain medications). °2. No driving day of procedure. °3. Upon discharge go home and rest for at least 4 hours.  May use an ice pack as needed to injection sites on back. °4. Remove bandades later, today. ° ° ° °Please contact our office at [phone] for the following symptoms: ° °· Fever greater than 100 degrees °· Increased swelling, pain, or redness at injection site. ° ° °Thank you for visiting [facility]. °

## 2021-01-07 NOTE — Telephone Encounter (Signed)
Received request from Disability determination services for records on patient from 10/2019-present.   Notes faxed to [phone] as requested.

## 2021-01-20 ENCOUNTER — Other Ambulatory Visit: Payer: Self-pay

## 2021-01-20 ENCOUNTER — Ambulatory Visit (INDEPENDENT_AMBULATORY_CARE_PROVIDER_SITE_OTHER): Payer: Federal, State, Local not specified - PPO

## 2021-01-20 ENCOUNTER — Ambulatory Visit: Payer: Federal, State, Local not specified - PPO | Admitting: Sports Medicine

## 2021-01-20 ENCOUNTER — Ambulatory Visit: Payer: Federal, State, Local not specified - PPO | Admitting: Physician Assistant

## 2021-01-20 VITALS — BP 155/94 | HR 78 | Ht 64.0 in | Wt 203.0 lb

## 2021-01-20 DIAGNOSIS — G43709 Chronic migraine without aura, not intractable, without status migrainosus: Secondary | ICD-10-CM

## 2021-01-20 DIAGNOSIS — M47816 Spondylosis without myelopathy or radiculopathy, lumbar region: Secondary | ICD-10-CM

## 2021-01-20 DIAGNOSIS — G43009 Migraine without aura, not intractable, without status migrainosus: Secondary | ICD-10-CM

## 2021-01-20 MED ORDER — NURTEC 75 MG PO TBDP: 1.0000 | ORAL_TABLET | ORAL | 5 refills | Status: DC | PRN

## 2021-01-20 MED ORDER — ZOLMITRIPTAN 5 MG PO TABS: 5.0000 mg | ORAL_TABLET | ORAL | 5 refills | Status: DC | PRN

## 2021-01-20 MED ORDER — AJOVY 225 MG/1.5ML ~~LOC~~ SOSY: 225.0000 mg | PREFILLED_SYRINGE | SUBCUTANEOUS | 2 refills | Status: DC

## 2021-01-20 NOTE — Progress Notes (Signed)
    Procedures performed today:    Procedure: Real-time Ultrasound Guided injection of the left sacroiliac joint Device: Samsung HS60  Verbal informed consent obtained.  Time-out conducted.  Noted no overlying erythema, induration, or other signs of local infection.  Skin prepped in a sterile fashion.  Local anesthesia: Topical Ethyl chloride.  With sterile technique and under real time ultrasound guidance:  Noted minimally arthritic SI joint, 1 cc Kenalog 40, 2 cc lidocaine, 2 cc bupivacaine injected easily Completed without difficulty  Advised to call if fevers/chills, erythema, induration, drainage, or persistent bleeding.  Images permanently stored and available for review in PACS.  Impression: Technically successful ultrasound guided injection.  Independent interpretation of notes and tests performed by another provider:   None.  Brief History, Exam, Impression, and Recommendations:    Lumbar spondylosis Angela Oliver returns, she is a pleasant 55 year old female, at this point she has had bilateral L4-S1 facet radiofrequency ablation, the right side worked well, she still has significant discomfort on the left referable to the sacroiliac joint, today I injected her left sacroiliac joint and we filled out some disability paperwork. She can message me in 3 weeks to let me know how things are going.    ___________________________________________ [name]. Benjamin Stain, M.D., ABFM., CAQSM. Primary Care and Sports Medicine [facility]  Adjunct Instructor of Family Medicine  [employer]

## 2021-01-20 NOTE — Progress Notes (Signed)
Subjective:    Patient ID: Angela Oliver, female    DOB: 07-10-1966, 55 y.o.   MRN: [id number]  HPI  Pt is a 55 yo female who presents to the clinic to follow up on Migraines and to get paperwork filled out for work.   She got ajovy approved and migraines have decreased. It brought her down from 15 a month to about 7 a month. zomig does help to rescue but not permitted to use at work due to sedation symptoms and not being able to complete job. nurtec helps as well. At times she has to take both.    .. Active Ambulatory Problems    Diagnosis Date Noted  . Allergic rhinitis 06/09/2011  . Asthma 06/09/2011  . GERD 06/09/2011  . 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 Herpes simplex 10/23/2012  . 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/2012  . Trochanteric bursitis of both hips 11/03/2012  . Trochanteric bursitis of right hip 11/22/2013  . Lumbar spondylosis 02/19/2014  . C7 radiculopathy 12/01/2015  . Obesity (BMI 30.0-34.9) 01/27/2017  . Pain of right breast 07/07/2017  . Family history of breast cancer in mother 07/07/2017  . Abnormal weight gain 07/07/2017  . Carpal tunnel syndrome on both sides 11/29/2017  . Dysfunction of left eustachian tube 02/01/2018  . Stress reaction 01/01/2019  . DDD (degenerative disc disease), lumbar 01/01/2019  . Seasonal allergies 03/18/2019  . Cyst of left kidney 09/10/2019  . Right kidney stone 09/10/2019  . Epigastric pain 11/26/2019  . OAB (overactive bladder) 05/28/2020  . Left upper quadrant pain 05/28/2020  . Abdominal distension (gaseous) 05/28/2020  . Abnormal brain MRI 09/30/2020  . Chronic migraine without aura without status migrainosus, not intractable 01/21/2021  . Migraine without aura and without status migrainosus, not intractable 01/26/2021   Resolved Ambulatory Problems    Diagnosis Date Noted  . RUQ PAIN 06/09/2011  . Migraines 10/23/2012  . Neck muscle spasm 11/03/2012  . Peroneal tendinitis of left lower extremity 12/29/2012   . Left foot pain 11/22/2013  . Foot pain, left 02/19/2014  . Poison ivy dermatitis 07/04/2014  . 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 Acute bronchitis 09/16/2015  . Paresthesia of both hands 12/01/2015  . Calculus of gallbladder without cholecystitis without obstruction 05/03/2016  . Symptomatic cholelithiasis 06/01/2016  . S/P cholecystectomy 07/16/2016  . Low back sprain 07/28/2016   Past Medical History:  Diagnosis Date  . Anxiety   . Arthritis   . Asthma   . Complication of anesthesia   . Family history of adverse reaction to anesthesia   . GERD (gastroesophageal reflux disease)   . Irregular heart beat        Review of Systems  All other systems reviewed and are negative.      Objective:   Physical Exam Vitals reviewed.  Constitutional:      Appearance: She is well-developed.  Cardiovascular:     Rate and Rhythm: Normal rate and regular rhythm.  Pulmonary:     Effort: Pulmonary effort is normal.     Breath sounds: Normal breath sounds.  Neurological:     Mental Status: She is alert.  Psychiatric:        Mood and Affect: Mood normal.           Assessment & Plan:  Marland KitchenMarland KitchenSharae was seen today for headache.  Diagnoses and all orders for this visit:  Chronic migraine without aura without status migrainosus, not intractable -     Fremanezumab-vfrm (  AJOVY) 225 MG/1.5ML SOSY; Inject 225 mg into the skin every 30 (thirty) days. -     zolmitriptan (ZOMIG) 5 MG tablet; Take 1 tablet (5 mg total) by mouth as needed for migraine. -     Rimegepant Sulfate (NURTEC) 75 MG TBDP; Take 1 tablet by mouth as needed.  Migraine without aura and without status migrainosus, not intractable -     Fremanezumab-vfrm (AJOVY) 225 MG/1.5ML SOSY; Inject 225 mg into the skin every 30 (thirty) days. -     zolmitriptan (ZOMIG) 5 MG tablet; Take 1 tablet (5 mg total) by mouth as needed for migraine. -     Rimegepant Sulfate (NURTEC) 75 MG  TBDP; Take 1 tablet by mouth as needed.   Paperwork filled out for work disability. Migraines at 7 a month. She is on ajovy as preventative. Rescue zomig and nurtec. Rescue medications are not permitted with current job. Not able to fullfill responsibilities at this point. Has appt with neurology/headache clinic for other options to treat and allow to work. Follow up in 3 months. Refilled ajovy.

## 2021-01-20 NOTE — Assessment & Plan Note (Signed)
Angela Oliver returns, she is a pleasant 55 year old female, at this point she has had bilateral L4-S1 facet radiofrequency ablation, the right side worked well, she still has significant discomfort on the left referable to the sacroiliac joint, today I injected her left sacroiliac joint and we filled out some disability paperwork. She can message me in 3 weeks to let me know how things are going.

## 2021-01-21 ENCOUNTER — Encounter: Payer: Self-pay | Admitting: Physician Assistant

## 2021-01-21 DIAGNOSIS — Z20822 Contact with and (suspected) exposure to covid-19: Secondary | ICD-10-CM | POA: Diagnosis not present

## 2021-01-21 DIAGNOSIS — G43709 Chronic migraine without aura, not intractable, without status migrainosus: Secondary | ICD-10-CM | POA: Insufficient documentation

## 2021-01-26 ENCOUNTER — Encounter: Payer: Self-pay | Admitting: Physician Assistant

## 2021-01-26 DIAGNOSIS — G43009 Migraine without aura, not intractable, without status migrainosus: Secondary | ICD-10-CM | POA: Insufficient documentation

## 2021-02-09 DIAGNOSIS — G939 Disorder of brain, unspecified: Secondary | ICD-10-CM | POA: Diagnosis not present

## 2021-02-16 DIAGNOSIS — M5136 Other intervertebral disc degeneration, lumbar region: Secondary | ICD-10-CM | POA: Diagnosis not present

## 2021-02-16 DIAGNOSIS — M51369 Other intervertebral disc degeneration, lumbar region without mention of lumbar back pain or lower extremity pain: Secondary | ICD-10-CM

## 2021-02-16 DIAGNOSIS — M47816 Spondylosis without myelopathy or radiculopathy, lumbar region: Secondary | ICD-10-CM

## 2021-02-17 ENCOUNTER — Other Ambulatory Visit: Payer: Self-pay

## 2021-02-17 ENCOUNTER — Ambulatory Visit (HOSPITAL_COMMUNITY)
Admission: RE | Admit: 2021-02-17 | Discharge: 2021-02-17 | Disposition: A | Discharge status: Home / Self Care | Payer: Federal, State, Local not specified - PPO | Source: Ambulatory Visit | Attending: Sports Medicine | Admitting: Sports Medicine

## 2021-02-17 DIAGNOSIS — M545 Low back pain, unspecified: Secondary | ICD-10-CM | POA: Diagnosis not present

## 2021-02-17 DIAGNOSIS — M5136 Other intervertebral disc degeneration, lumbar region: Secondary | ICD-10-CM | POA: Diagnosis not present

## 2021-02-17 MED ORDER — HYDROCODONE-ACETAMINOPHEN 5-325 MG PO TABS: 1.0000 | ORAL_TABLET | Freq: Every day | ORAL | 0 refills | Status: DC | PRN

## 2021-02-17 NOTE — Assessment & Plan Note (Signed)
Patient calls in with severe increasing low back pain with urinary incontinence, adding a stat lumbar spine MRI, hydrocodone.

## 2021-02-17 NOTE — Telephone Encounter (Signed)
I spent 5 total minutes of online digital evaluation and management services. 

## 2021-02-18 ENCOUNTER — Telehealth: Payer: Self-pay

## 2021-02-18 DIAGNOSIS — M5136 Other intervertebral disc degeneration, lumbar region: Secondary | ICD-10-CM

## 2021-02-18 DIAGNOSIS — M47816 Spondylosis without myelopathy or radiculopathy, lumbar region: Secondary | ICD-10-CM

## 2021-02-18 MED ORDER — HYDROCODONE-ACETAMINOPHEN 5-325 MG PO TABS: 1.0000 | ORAL_TABLET | Freq: Every day | ORAL | 0 refills | Status: DC | PRN

## 2021-02-18 NOTE — Telephone Encounter (Signed)
[facility] per patient request on MyChart message.

## 2021-02-18 NOTE — Telephone Encounter (Signed)
Patient called in requesting a change in dosage of her hydrocodone. She stated the pharmacy is out of her regular dose. Called pharmacy to verify[facility] 5-325mg  dosage. She stated that they have the 7.5mg  and 10mg  available. Her other suggestion was to try another drug store that may use another supplier and may still be able to get the correct medication dosage prescribed.

## 2021-02-18 NOTE — Addendum Note (Signed)
Addended by: Monica Becton on: 02/18/2021 03:39 PM   Modules accepted: Orders

## 2021-02-19 NOTE — Telephone Encounter (Signed)
Spoke with patient, she was not understanding that [facility] can [facility] right now. She asked that it be sent to [facility].

## 2021-02-20 MED ORDER — HYDROCODONE-ACETAMINOPHEN 5-325 MG PO TABS: 1.0000 | ORAL_TABLET | Freq: Every day | ORAL | 0 refills | Status: DC | PRN

## 2021-02-20 NOTE — Telephone Encounter (Signed)
Switched to [facility]

## 2021-02-21 ENCOUNTER — Other Ambulatory Visit: Payer: Self-pay | Admitting: Physician Assistant

## 2021-02-21 DIAGNOSIS — G8929 Other chronic pain: Secondary | ICD-10-CM

## 2021-02-21 DIAGNOSIS — M7061 Trochanteric bursitis, right hip: Secondary | ICD-10-CM

## 2021-02-21 DIAGNOSIS — M545 Low back pain, unspecified: Secondary | ICD-10-CM

## 2021-02-21 DIAGNOSIS — M7062 Trochanteric bursitis, left hip: Secondary | ICD-10-CM

## 2021-03-13 ENCOUNTER — Ambulatory Visit: Payer: Federal, State, Local not specified - PPO | Admitting: Sports Medicine

## 2021-03-13 ENCOUNTER — Other Ambulatory Visit: Payer: Self-pay

## 2021-03-13 DIAGNOSIS — M47816 Spondylosis without myelopathy or radiculopathy, lumbar region: Secondary | ICD-10-CM

## 2021-03-13 DIAGNOSIS — M7062 Trochanteric bursitis, left hip: Secondary | ICD-10-CM

## 2021-03-13 DIAGNOSIS — M545 Low back pain, unspecified: Secondary | ICD-10-CM

## 2021-03-13 DIAGNOSIS — M7061 Trochanteric bursitis, right hip: Secondary | ICD-10-CM

## 2021-03-13 DIAGNOSIS — G8929 Other chronic pain: Secondary | ICD-10-CM

## 2021-03-13 MED ORDER — CELECOXIB 200 MG PO CAPS
ORAL_CAPSULE | ORAL | 1 refills | Status: DC
Start: 2021-03-13 — End: 2022-02-17

## 2021-03-13 MED ORDER — CYCLOBENZAPRINE HCL 10 MG PO TABS: ORAL_TABLET | ORAL | 3 refills | Status: DC

## 2021-03-13 NOTE — Assessment & Plan Note (Signed)
This is a pleasant 55 year old female, she has multifactorial low back pain, she does have bilateral L4 S1 facet arthritis, and has had bilateral L4-S1 medial branch blocks and radiofrequency ablation, this was done with [facility] imaging. The right-sided radiofrequency ablation worked well but continued to have some pain in the left, clinically it was referable to the left sacroiliac joint and I injected this under ultrasound guidance in February. She had 2 to 3 days of near complete relief followed by return of pain. Because of this I think she is now a candidate for facet radiofrequency ablation, [facility] does not perform this procedure as I was hoping we could get a consultation with Dr. Roderic Ovens for either standard sacroiliac joint RFA or cooled RFA. Refilling Celebrex and Flexeril.  I did fill out some disability forms today.

## 2021-03-13 NOTE — Progress Notes (Signed)
    Procedures performed today:    None.  Independent interpretation of notes and tests performed by another provider:   None.  Brief History, Exam, Impression, and Recommendations:    Lumbar spondylosis This is a pleasant 55 year old female, she has multifactorial low back pain, she does have bilateral L4 S1 facet arthritis, and has had bilateral L4-S1 medial branch blocks and radiofrequency ablation, this was done with [facility] imaging. The right-sided radiofrequency ablation worked well but continued to have some pain in the left, clinically it was referable to the left sacroiliac joint and I injected this under ultrasound guidance in February. She had 2 to 3 days of near complete relief followed by return of pain. Because of this I think she is now a candidate for facet radiofrequency ablation, [place] [facility] does not perform this procedure as I was hoping we could get a consultation with Dr. Roderic Ovens for either standard sacroiliac joint RFA or cooled RFA. Refilling Celebrex and Flexeril.  I did fill out some disability forms today.    ___________________________________________ [name]. Benjamin Stain, M.D., ABFM., CAQSM. Primary Care and Sports Medicine [facility]  Adjunct Instructor of Family Medicine  [employer]

## 2021-03-31 ENCOUNTER — Other Ambulatory Visit: Payer: Self-pay | Admitting: Physician Assistant

## 2021-03-31 DIAGNOSIS — J4530 Mild persistent asthma, uncomplicated: Secondary | ICD-10-CM

## 2021-03-31 DIAGNOSIS — J3089 Other allergic rhinitis: Secondary | ICD-10-CM

## 2021-04-09 DIAGNOSIS — G939 Disorder of brain, unspecified: Secondary | ICD-10-CM | POA: Diagnosis not present

## 2021-04-09 DIAGNOSIS — G9389 Other specified disorders of brain: Secondary | ICD-10-CM | POA: Diagnosis not present

## 2021-04-09 DIAGNOSIS — R519 Headache, unspecified: Secondary | ICD-10-CM | POA: Diagnosis not present

## 2021-04-09 DIAGNOSIS — Z888 Allergy status to other drugs, medicaments and biological substances status: Secondary | ICD-10-CM | POA: Diagnosis not present

## 2021-04-09 DIAGNOSIS — Z882 Allergy status to sulfonamides status: Secondary | ICD-10-CM | POA: Diagnosis not present

## 2021-04-15 DIAGNOSIS — M5418 Radiculopathy, sacral and sacrococcygeal region: Secondary | ICD-10-CM | POA: Diagnosis not present

## 2021-04-15 DIAGNOSIS — G8929 Other chronic pain: Secondary | ICD-10-CM | POA: Diagnosis not present

## 2021-04-15 DIAGNOSIS — M533 Sacrococcygeal disorders, not elsewhere classified: Secondary | ICD-10-CM | POA: Diagnosis not present

## 2021-04-17 ENCOUNTER — Encounter: Payer: Self-pay | Admitting: Physician Assistant

## 2021-04-17 ENCOUNTER — Ambulatory Visit (INDEPENDENT_AMBULATORY_CARE_PROVIDER_SITE_OTHER): Payer: Federal, State, Local not specified - PPO | Admitting: Physician Assistant

## 2021-04-17 ENCOUNTER — Other Ambulatory Visit: Payer: Self-pay

## 2021-04-17 VITALS — BP 170/86 | HR 78 | Ht 64.0 in | Wt 211.0 lb

## 2021-04-17 DIAGNOSIS — G43009 Migraine without aura, not intractable, without status migrainosus: Secondary | ICD-10-CM | POA: Diagnosis not present

## 2021-04-17 DIAGNOSIS — M7062 Trochanteric bursitis, left hip: Secondary | ICD-10-CM

## 2021-04-17 DIAGNOSIS — M7061 Trochanteric bursitis, right hip: Secondary | ICD-10-CM | POA: Insufficient documentation

## 2021-04-17 DIAGNOSIS — K219 Gastro-esophageal reflux disease without esophagitis: Secondary | ICD-10-CM

## 2021-04-17 DIAGNOSIS — G43709 Chronic migraine without aura, not intractable, without status migrainosus: Secondary | ICD-10-CM | POA: Diagnosis not present

## 2021-04-17 MED ORDER — NURTEC 75 MG PO TBDP: 1.0000 | ORAL_TABLET | ORAL | 5 refills | Status: DC | PRN

## 2021-04-17 MED ORDER — OMEPRAZOLE 40 MG PO CPDR: 40.0000 mg | DELAYED_RELEASE_CAPSULE | Freq: Every day | ORAL | 3 refills | Status: DC

## 2021-04-17 NOTE — Patient Instructions (Signed)
Hip Bursitis  Hip bursitis is swelling of one or more fluid-filled sacs (bursae) in your hip joint. This condition can cause pain, and your symptoms may come and go over time. What are the causes?  Repeated use of your hip muscles.  Injury to the hip.  Weak butt muscles.  Bone spurs.  Infection. In some cases, the cause may not be known. What increases the risk? You are more likely to develop this condition if:  You had a past hip injury or hip surgery.  You have a condition, such as arthritis, gout, diabetes, or thyroid disease.  You have spine problems.  You have one leg that is shorter than the other.  You run a lot or do long-distance running.  You play sports where there is a risk of injury or falling, such as football, martial arts, or skiing. What are the signs or symptoms? Symptoms may come and go, and they often include:  Pain in the hip or groin area. Pain may get worse when you move your hip.  Tenderness and swelling of the hip. In rare cases, the bursa may become infected. If this happens, you may get a fever, as well as have warmth and redness in the hip area. How is this treated? This condition is treated by:  Resting your hip.  Icing your hip.  Wrapping the hip area with an elastic bandage (compression wrap).  Keeping the hip raised. Other treatments may include medicine, draining fluid out of the bursa, or using crutches, a cane, or a walker. Surgery may be needed, but this is rare. Long-term treatment may include doing exercises to help your strength and flexibility. It may also include lifestyle changes like losing weight to lessen the strain on your hip. Follow these instructions at home: Managing pain, stiffness, and swelling  If told, put ice on the painful area. ? Put ice in a plastic bag. ? Place a towel between your skin and the bag. ? Leave the ice on for 20 minutes, 2-3 times a day.  Raise your hip by putting a pillow under your hips  while you lie down. Stop if you feel pain.  If told, put heat on the affected area. Do this as often as told by your doctor. Use a moist heat pack or a heating pad as told by your doctor. ? Place a towel between your skin and the heat source. ? Leave the heat on for 20-30 minutes. ? Take off the heat if your skin turns bright red. This is very important if you are unable to feel pain, heat, or cold. You may have a greater risk of getting burned.      Activity  Do not use your hip to support your body weight until your doctor says that you can.  Use crutches, a cane, or a walker as told by your doctor.  If the affected leg is one that you use to drive, ask your doctor if it is safe to drive.  Rest and protect your hip as much as you can until you feel better.  Return to your normal activities as told by your doctor. Ask your doctor what activities are safe for you.  Do exercises as told by your doctor. General instructions  Take over-the-counter and prescription medicines only as told by your doctor.  Gently rub and stretch your injured area as often as is comfortable.  Wear elastic bandages only as told by your doctor.  If one of your legs is   shorter than the other, get fitted for a shoe insert or orthotic.  Keep a healthy weight. Follow instructions from your doctor.  Keep all follow-up visits as told by your doctor. This is important. How is this prevented?  Exercise regularly, as told by your doctor.  Wear the right shoes for the sport you play.  Warm up and stretch before being active. Cool down and stretch after being active.  Take breaks often from repeated activity.  Avoid activities that bother your hip or cause pain.  Avoid sitting down for a long time. Where to find more information  American Academy of Orthopaedic Surgeons: orthoinfo.aaos.org Contact a doctor if:  You have a fever.  You have new symptoms.  You have trouble walking or doing everyday  activities.  You have pain that gets worse or does not get better with medicine.  Your skin around your hip is red.  You get a feeling of warmth in your hip area. Get help right away if:  You cannot move your hip.  You have very bad pain.  You cannot control the muscles in your feet. Summary  Hip bursitis is swelling of one or more fluid-filled sacs (bursae) in your hip joint.  Symptoms often come and go over time.  This condition is often treated by resting and icing the hip. It also may help to keep the area raised and wrapped in an elastic bandage. Other treatments may be needed. This information is not intended to replace advice given to you by your health care provider. Make sure you discuss any questions you have with your health care provider. Document Revised: 10/01/2019 Document Reviewed: 08/07/2018 Elsevier Patient Education  2021 Elsevier Inc.  

## 2021-04-17 NOTE — Progress Notes (Addendum)
Subjective:    Patient ID: Angela Oliver, female    DOB: [birth year], 55 y.o.   MRN: [id number]  HPI  Pt is a 55 year old female with history of hip bursitis who presents to the clinic with bilateral hip pain.   The pain started 2-3 weeks ago and feels like an exacerbation of her bursitis. It is worse with ambulation and has not been improved with celebrex (which she takes for her back pain). She has been using heat and ice on both hips. There is no warmth, swelling, or radiating pain associated. The last injection she had improved the pain for 2-3 months.   nurtec as needed is helping to rescue migraines when she gets them. ajovy is helping to prevent migraines.   .. Active Ambulatory Problems    Diagnosis Date Noted  . Allergic rhinitis 06/09/2011  . Asthma 06/09/2011  . GERD 06/09/2011  . 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 Herpes simplex 10/23/2012  . 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/2012  . Trochanteric bursitis of both hips 11/03/2012  . Trochanteric bursitis of right hip 11/22/2013  . Lumbar spondylosis 02/19/2014  . C7 radiculopathy 12/01/2015  . Obesity (BMI 30.0-34.9) 01/27/2017  . Pain of right breast 07/07/2017  . Family history of breast cancer in mother 07/07/2017  . Abnormal weight gain 07/07/2017  . Carpal tunnel syndrome on both sides 11/29/2017  . Dysfunction of left eustachian tube 02/01/2018  . Stress reaction 01/01/2019  . DDD (degenerative disc disease), lumbar 01/01/2019  . Seasonal allergies 03/18/2019  . Cyst of left kidney 09/10/2019  . Right kidney stone 09/10/2019  . Epigastric pain 11/26/2019  . OAB (overactive bladder) 05/28/2020  . Left upper quadrant pain 05/28/2020  . Abdominal distension (gaseous) 05/28/2020  . Abnormal brain MRI 09/30/2020  . Chronic migraine without aura without status migrainosus, not intractable 01/21/2021  . Migraine without aura and without status migrainosus, not intractable 01/26/2021  . Greater trochanteric  bursitis of both hips 04/17/2021   Resolved Ambulatory Problems    Diagnosis Date Noted  . RUQ PAIN 06/09/2011  . Migraines 10/23/2012  . Neck muscle spasm 11/03/2012  . Peroneal tendinitis of left lower extremity 12/29/2012  . Left foot pain 11/22/2013  . Foot pain, left 02/19/2014  . Poison ivy dermatitis 07/04/2014  . 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 Acute bronchitis 09/16/2015  . Paresthesia of both hands 12/01/2015  . Calculus of gallbladder without cholecystitis without obstruction 05/03/2016  . Symptomatic cholelithiasis 06/01/2016  . S/P cholecystectomy 07/16/2016  . Low back sprain 07/28/2016   Past Medical History:  Diagnosis Date  . Anxiety   . Arthritis   . Asthma   . Complication of anesthesia   . Family history of adverse reaction to anesthesia   . GERD (gastroesophageal reflux disease)   . Irregular heart beat        Review of Systems See HPI.     Objective:   Physical Exam Vitals reviewed.  Constitutional:      Appearance: Normal appearance.  Cardiovascular:     Rate and Rhythm: Normal rate and regular rhythm.     Pulses: Normal pulses.  Pulmonary:     Effort: Pulmonary effort is normal.     Breath sounds: Normal breath sounds.  Musculoskeletal:     Comments: Bilateral greater trochanter tenderness to palpation. Limited ROM of bilateral hips due to pain.   Neurological:     General: No focal deficit present.  Mental Status: She is alert and oriented to person, place, and time.  Psychiatric:        Mood and Affect: Mood normal.           Assessment & Plan:  Marland KitchenMarland KitchenAllecia was seen today for hip pain.  Diagnoses and all orders for this visit:  Greater trochanteric bursitis of both hips  Gastroesophageal reflux disease without esophagitis -     omeprazole (PRILOSEC) 40 MG capsule; Take 1 capsule (40 mg total) by mouth daily.  Chronic migraine without aura without status migrainosus, not  intractable -     Rimegepant Sulfate (NURTEC) 75 MG TBDP; Take 1 tablet by mouth as needed.  Migraine without aura and without status migrainosus, not intractable -     Rimegepant Sulfate (NURTEC) 75 MG TBDP; Take 1 tablet by mouth as needed.    Bilateral hip bursa injections Discussed importance of moving hip joint over the next few days to improve dispersement of steroid.    Patient's headaches have been well controlled with AJOVY. She would like a refill for Nurtec to use prn for migraines.  Omeprazole refilled.   Aspiration/Injection Procedure Note Angela Oliver [id number] May 28, 1966  Procedure: Injection both hips injected.  Indications: pain  Procedure Details Consent: Risks of procedure as well as the alternatives and risks of each were explained to the (patient/caregiver).  Consent for procedure obtained. Time Out: Verified patient identification, verified procedure, site/side was marked, verified correct patient position, special equipment/implants available, medications/allergies/relevent history reviewed, required imaging and test results available.  Performed   Local Anesthesia Used:Ethyl Chloride Spray Amount of Fluid Aspirated: none  Injected 40mg  of depo medrol and 1 percent lidocaine 88mL into both hip bursas.   A sterile dressing was applied.  Patient did tolerate procedure well. Estimated blood loss: NONE  .10m PA-C, have reviewed and agree with the above documentation in it's entirety.

## 2021-04-20 ENCOUNTER — Encounter: Payer: Self-pay | Admitting: Physician Assistant

## 2021-04-22 DIAGNOSIS — G43109 Migraine with aura, not intractable, without status migrainosus: Secondary | ICD-10-CM | POA: Diagnosis not present

## 2021-05-09 ENCOUNTER — Other Ambulatory Visit: Payer: Self-pay | Admitting: Physician Assistant

## 2021-05-09 DIAGNOSIS — M5412 Radiculopathy, cervical region: Secondary | ICD-10-CM

## 2021-05-19 ENCOUNTER — Telehealth: Payer: Self-pay

## 2021-05-19 NOTE — Telephone Encounter (Signed)
PA submitted for Ajovy. Awaiting response.

## 2021-05-28 DIAGNOSIS — M533 Sacrococcygeal disorders, not elsewhere classified: Secondary | ICD-10-CM | POA: Diagnosis not present

## 2021-07-02 DIAGNOSIS — G8929 Other chronic pain: Secondary | ICD-10-CM | POA: Diagnosis not present

## 2021-07-02 DIAGNOSIS — M533 Sacrococcygeal disorders, not elsewhere classified: Secondary | ICD-10-CM | POA: Diagnosis not present

## 2021-07-02 DIAGNOSIS — M5418 Radiculopathy, sacral and sacrococcygeal region: Secondary | ICD-10-CM | POA: Diagnosis not present

## 2021-08-09 ENCOUNTER — Other Ambulatory Visit: Payer: Self-pay | Admitting: Physician Assistant

## 2021-08-09 DIAGNOSIS — G43709 Chronic migraine without aura, not intractable, without status migrainosus: Secondary | ICD-10-CM

## 2021-08-10 ENCOUNTER — Other Ambulatory Visit: Payer: Self-pay | Admitting: Physician Assistant

## 2021-08-10 DIAGNOSIS — G43709 Chronic migraine without aura, not intractable, without status migrainosus: Secondary | ICD-10-CM

## 2021-08-12 ENCOUNTER — Telehealth: Payer: Self-pay

## 2021-08-12 NOTE — Telephone Encounter (Signed)
Received fax from pharmacy stating that Arnetha Massy is no longer covered un the pt's insurance.  Please consider alternative.  Tiajuana Amass,  CMA

## 2021-08-14 NOTE — Telephone Encounter (Signed)
Received request from PCP to assist.   - Previous trials of aimovig and emgality - Ajovy was successful for patient but now no longer covered under insurance - Patient is also seen by neurology for migraines  Assessment/Plan: Recommend PCP and/or patient reach out to neurology for next steps, as patient already has tried other CGRP antagonists and at this point would likely mean a different agent outside of this medication class.

## 2021-08-19 NOTE — Telephone Encounter (Signed)
Fax sent to Neurology office asking them to assist with migraine medication management and coverage.

## 2021-09-20 ENCOUNTER — Other Ambulatory Visit: Payer: Self-pay

## 2021-09-20 ENCOUNTER — Emergency Department
Admission: EM | Admit: 2021-09-20 | Discharge: 2021-09-20 | Disposition: A | Discharge status: Home / Self Care | Payer: Federal, State, Local not specified - PPO | Source: Home / Self Care | Attending: Family Medicine | Admitting: Family Medicine

## 2021-09-20 ENCOUNTER — Encounter: Payer: Self-pay | Admitting: Emergency Medicine

## 2021-09-20 DIAGNOSIS — U071 COVID-19: Secondary | ICD-10-CM | POA: Diagnosis not present

## 2021-09-20 DIAGNOSIS — J4530 Mild persistent asthma, uncomplicated: Secondary | ICD-10-CM

## 2021-09-20 MED ORDER — ALBUTEROL SULFATE HFA 108 (90 BASE) MCG/ACT IN AERS: 2.0000 | INHALATION_SPRAY | Freq: Four times a day (QID) | RESPIRATORY_TRACT | 0 refills | Status: DC | PRN

## 2021-09-20 MED ORDER — NIRMATRELVIR/RITONAVIR (PAXLOVID)TABLET: 3.0000 | ORAL_TABLET | Freq: Two times a day (BID) | ORAL | 0 refills | Status: AC

## 2021-09-20 MED ORDER — BENZONATATE 200 MG PO CAPS: 200.0000 mg | ORAL_CAPSULE | Freq: Two times a day (BID) | ORAL | 0 refills | Status: DC | PRN

## 2021-09-20 MED ORDER — PREDNISONE 20 MG PO TABS: 20.0000 mg | ORAL_TABLET | Freq: Two times a day (BID) | ORAL | 0 refills | Status: DC

## 2021-09-20 NOTE — ED Provider Notes (Signed)
[facility]    CSN: [id number] Arrival date & time: 09/20/21  1025      History   Chief Complaint Chief Complaint  Patient presents with  . Covid Positive  . Cough    HPI Angela Oliver is a 55 y.o. female.   HPI Patient has COVID.  She had a positive test at home.  She has been having symptoms for 2-1/2 days.  She states that she has a very harsh cough.  She does have underlying asthma.  She does have underlying health problems including allergies, migraines, anxiety and depression.  She is obese.  These places her at increased risk from a COVID infection. Past Medical History:  Diagnosis Date  . Anxiety   . Arthritis   . Asthma   . Complication of anesthesia    slow to awaken  . Family history of adverse reaction to anesthesia    mother slow to awaken  . GERD (gastroesophageal reflux disease)   . Herpes simplex   . Irregular heart beat    in the past- no palpations- "skipped a beat"  . Migraines   . Seasonal allergies     Patient Active Problem List   Diagnosis Date Noted  . Migraine without aura and without status migrainosus, not intractable 01/26/2021  . Chronic migraine without aura without status migrainosus, not intractable 01/21/2021  . Abnormal brain MRI 09/30/2020  . OAB (overactive bladder) 05/28/2020  . Abdominal distension (gaseous) 05/28/2020  . Epigastric pain 11/26/2019  . Cyst of left kidney 09/10/2019  . Right kidney stone 09/10/2019  . Seasonal allergies 03/18/2019  . Stress reaction 01/01/2019  . DDD (degenerative disc disease), lumbar 01/01/2019  . Dysfunction of left eustachian tube 02/01/2018  . Carpal tunnel syndrome on both sides 11/29/2017  . Pain of right breast 07/07/2017  . Family history of breast cancer in mother 07/07/2017  . Obesity (BMI 30.0-34.9) 01/27/2017  . C7 radiculopathy 12/01/2015  . Lumbar spondylosis 02/19/2014  . 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/2012  . Trochanteric bursitis of both hips 11/03/2012   . 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 Herpes simplex 10/23/2012  . Allergic rhinitis 06/09/2011  . Asthma 06/09/2011  . GERD 06/09/2011    Past Surgical History:  Procedure Laterality Date  . APPENDECTOMY    . CHOLECYSTECTOMY  06/01/2016   laproscopic   . CHOLECYSTECTOMY N/A 06/01/2016   Procedure: LAPAROSCOPIC CHOLECYSTECTOMY WITH INTRAOPERATIVE CHOLANGIOGRAM, REMOVAL PERITONEAL NODULE ADJACENT TO SIGMOID COLON;  Surgeon: Avel Peace, MD;  Location: [facility] OR;  Service: General;  Laterality: N/A;  . COLONOSCOPY     age 25  . OVARIAN CYST REMOVAL Left    part of ovary- cyst hadf ruptured    OB History   No obstetric history on file.      Home Medications    Prior to Admission medications   Medication Sig Start Date End Date Taking? Authorizing Provider  Albuterol Sulfate (PROAIR RESPICLICK) 108 (90 Base) MCG/ACT AEPB Inhale 2 puffs into the lungs every 4 (four) hours as needed (shortness of breath/ wheezing). Patient taking differently: Inhale 2 puffs into the lungs in the morning and at bedtime. 01/23/18  Yes Carlis Stable, PA-C  beclomethasone (QVAR) 80 MCG/ACT inhaler Inhale 2 puffs into the lungs 2 (two) times daily. 03/16/19  Yes Breeback, Jade L, PA-C  benzonatate (TESSALON) 200 MG capsule Take 1 capsule (200 mg total) by mouth 2 (two) times daily as needed for cough. 09/20/21  Yes Eustace Moore, MD  celecoxib (CELEBREX) 200  MG capsule 1 capsule p.o. twice daily 03/13/21  Yes Monica Becton, MD  cetirizine (ZYRTEC) 10 MG tablet Take 1 tablet (10 mg total) by mouth daily. 07/12/18  Yes Breeback, Jade L, PA-C  cyclobenzaprine (FLEXERIL) 10 MG tablet 1 tab p.o. 3 times daily 03/13/21  Yes Monica Becton, MD  DULoxetine (CYMBALTA) 30 MG capsule TAKE 1 CAPSULE BY MOUTH EVERY DAY 05/12/21  Yes Breeback, Jade L, PA-C  Fremanezumab-vfrm (AJOVY) 225 MG/1.5ML SOAJ INJECT ONCE (225 MG) INTO THE SKIN EVERY 30 (THIRTY) DAYS. 08/10/21  Yes Breeback, Jade L, PA-C   Fremanezumab-vfrm (AJOVY) 225 MG/1.5ML SOSY Inject 225 mg into the skin every 30 (thirty) days. 01/20/21  Yes Breeback, Jade L, PA-C  montelukast (SINGULAIR) 10 MG tablet TAKE 1 TABLET BY MOUTH EVERY DAY 03/31/21  Yes Agapito Games, MD  nirmatrelvir/ritonavir EUA (PAXLOVID) 20 x 150 MG & 10 x 100MG  TABS Take 3 tablets by mouth 2 (two) times daily for 5 days. Patient GFR is 78. Take nirmatrelvir (150 mg) two tablets twice daily for 5 days and ritonavir (100 mg) one tablet twice daily for 5 days. 09/20/21 09/25/21 Yes 09/27/21, MD  omeprazole (PRILOSEC) 40 MG capsule Take 1 capsule (40 mg total) by mouth daily. 04/17/21  Yes Breeback, Jade L, PA-C  predniSONE (DELTASONE) 20 MG tablet Take 1 tablet (20 mg total) by mouth 2 (two) times daily with a meal. 09/20/21  Yes 11/20/21, MD  Pyridoxine HCl (VITAMIN B-6 PO) Take 1 tablet by mouth daily.   Yes [provider]  Rimegepant Sulfate (NURTEC) 75 MG TBDP Take 1 tablet by mouth as needed. 04/17/21  Yes Breeback, Jade L, PA-C  sucralfate (CARAFATE) 1 g tablet TAKE 1 TABLET 4 TIMES A DAY Patient taking differently: Take 1 g by mouth daily as needed. 12/13/19  Yes Breeback, Jade L, PA-C  zolmitriptan (ZOMIG) 5 MG tablet Take 1 tablet (5 mg total) by mouth as needed for migraine. 01/20/21  Yes Breeback, Jade L, PA-C  albuterol (VENTOLIN HFA) 108 (90 Base) MCG/ACT inhaler Inhale 2 puffs into the lungs every 6 (six) hours as needed for wheezing. 09/20/21   11/20/21, MD    Family History Family History  Problem Relation Age of Onset  . Cancer Mother        breast cancer  . Alcohol abuse Father   . Diabetes Father   . Hyperlipidemia Father   . Asthma Other     Social History Social History   Tobacco Use  . Smoking status: Never  . Smokeless tobacco: Never  Vaping Use  . Vaping Use: Never used  Substance Use Topics  . Alcohol use: No  . Drug use: No     Allergies   Sulfa antibiotics, Topamax [topiramate],  and Sumatriptan   Review of Systems Review of Systems See HPI  Physical Exam Triage Vital Signs ED Triage Vitals  Enc Vitals Group     BP 09/20/21 1042 (!) 143/78     Pulse Rate 09/20/21 1042 (!) 102     Resp 09/20/21 1042 18     Temp 09/20/21 1042 99 F (37.2 C)     Temp Source 09/20/21 1042 Oral     SpO2 09/20/21 1042 96 %     Weight --      Height --      Head Circumference --      Peak Flow --      Pain Score 09/20/21 1040 4  Pain Loc --      Pain Edu? --      Excl. in GC? --    No data found.  Updated Vital Signs BP (!) 143/78 (BP Location: Left Arm)   Pulse (!) 102   Temp 99 F (37.2 C) (Oral)   Resp 18   LMP 04/15/2017 (Exact Date)   SpO2 96%       Physical Exam Constitutional:      General: She is not in acute distress.    Appearance: She is well-developed. She is obese. She is ill-appearing.  HENT:     Head: Normocephalic and atraumatic.     Right Ear: Tympanic membrane and ear canal normal.     Left Ear: Tympanic membrane and ear canal normal.     Nose: Nose normal. No rhinorrhea.     Mouth/Throat:     Mouth: Mucous membranes are moist.     Pharynx: Posterior oropharyngeal erythema present.  Eyes:     Conjunctiva/sclera: Conjunctivae normal.     Pupils: Pupils are equal, round, and reactive to light.  Cardiovascular:     Rate and Rhythm: Regular rhythm. Tachycardia present.     Heart sounds: No murmur heard. Pulmonary:     Effort: Pulmonary effort is normal. No respiratory distress.     Breath sounds: Normal breath sounds. No wheezing or rales.  Abdominal:     General: There is no distension.     Palpations: Abdomen is soft.  Musculoskeletal:        General: Normal range of motion.     Cervical back: Normal range of motion and neck supple.  Lymphadenopathy:     Cervical: No cervical adenopathy.  Skin:    General: Skin is warm and dry.  Neurological:     Mental Status: She is alert.     UC Treatments / Results  Labs (all labs  ordered are listed, but only abnormal results are displayed) Labs Reviewed - No data to display  EKG   Radiology No results found.  Procedures Procedures (including critical care time)  Medications Ordered in UC Medications - No data to display  Initial Impression / Assessment and Plan / UC Course  I have reviewed the triage vital signs and the nursing notes.  Pertinent labs & imaging results that were available during my care of the patient were reviewed by me and considered in my medical decision making (see chart for details).     Because of patient's risk category I am going to give her paxlovid.  Pulm also prescribing Ventolin inhaler, prednisone, and Tessalon for cough. Final Clinical Impressions(s) / UC Diagnoses   Final diagnoses:  COVID-19  Mild persistent asthma without complication     Discharge Instructions      Make sure you are drinking plenty of water Take the paxlovid as instructed Continue using your albuterol inhaler as needed for wheezing Take prednisone 2 times a day for 5 days.  Take 2 doses today You may take Tessalon 2 or 3 times a day as needed for cough.  Take in addition to Mucinex DM or Delsym Call for questions or problems    ED Prescriptions     Medication Sig Dispense Auth. Provider   albuterol (VENTOLIN HFA) 108 (90 Base) MCG/ACT inhaler Inhale 2 puffs into the lungs every 6 (six) hours as needed for wheezing. 1 each Eustace Moore, MD   nirmatrelvir/ritonavir EUA (PAXLOVID) 20 x 150 MG & 10 x 100MG  TABS Take 3 tablets  by mouth 2 (two) times daily for 5 days. Patient GFR is 78. Take nirmatrelvir (150 mg) two tablets twice daily for 5 days and ritonavir (100 mg) one tablet twice daily for 5 days. 30 tablet Eustace Moore, MD   predniSONE (DELTASONE) 20 MG tablet Take 1 tablet (20 mg total) by mouth 2 (two) times daily with a meal. 10 tablet Eustace Moore, MD   benzonatate (TESSALON) 200 MG capsule Take 1 capsule (200 mg  total) by mouth 2 (two) times daily as needed for cough. 20 capsule Eustace Moore, MD      PDMP not reviewed this encounter.   Eustace Moore, MD 09/20/21 1322

## 2021-09-20 NOTE — Discharge Instructions (Addendum)
Make sure you are drinking plenty of water Take the paxlovid as instructed Continue using your albuterol inhaler as needed for wheezing Take prednisone 2 times a day for 5 days.  Take 2 doses today You may take Tessalon 2 or 3 times a day as needed for cough.  Take in addition to Mucinex DM or Delsym Call for questions or problems

## 2021-09-20 NOTE — ED Triage Notes (Signed)
Patient presents to Urgent Care with complaints of covid positive since 3 days ago. Patient reports having dizziness, chills, nasal congestion, no taste or smell, fatigued, dry cough, and swollen lymph nodes. Did a home covid test on Friday was positive. Her boyfriend was positive 1 week ago. Taking Theraflu, using albuterol inhaler.

## 2021-09-21 ENCOUNTER — Telehealth (INDEPENDENT_AMBULATORY_CARE_PROVIDER_SITE_OTHER): Payer: Federal, State, Local not specified - PPO | Admitting: Family Medicine

## 2021-09-21 DIAGNOSIS — U071 COVID-19: Secondary | ICD-10-CM

## 2021-09-21 NOTE — Progress Notes (Deleted)
Went to urgent care yesterday and given the following instructions: Make sure you are drinking plenty of water Take the paxlovid as instructed Continue using your albuterol inhaler as needed for wheezing Take prednisone 2 times a day for 5 days.  Take 2 doses today You may take Tessalon 2 or 3 times a day as needed for cough.  Take in addition to Mucinex DM or Delsym

## 2021-09-22 NOTE — Progress Notes (Signed)
Appointment cancelled

## 2021-09-23 ENCOUNTER — Encounter: Payer: Self-pay | Admitting: Physician Assistant

## 2021-10-05 ENCOUNTER — Encounter: Payer: Self-pay | Admitting: Physician Assistant

## 2021-10-09 DIAGNOSIS — U071 COVID-19: Secondary | ICD-10-CM | POA: Diagnosis not present

## 2021-10-09 DIAGNOSIS — G939 Disorder of brain, unspecified: Secondary | ICD-10-CM | POA: Diagnosis not present

## 2021-10-09 DIAGNOSIS — Z791 Long term (current) use of non-steroidal anti-inflammatories (NSAID): Secondary | ICD-10-CM | POA: Diagnosis not present

## 2021-10-09 DIAGNOSIS — G9389 Other specified disorders of brain: Secondary | ICD-10-CM | POA: Diagnosis not present

## 2021-10-09 DIAGNOSIS — G43909 Migraine, unspecified, not intractable, without status migrainosus: Secondary | ICD-10-CM | POA: Diagnosis not present

## 2021-12-25 ENCOUNTER — Other Ambulatory Visit: Payer: Self-pay

## 2021-12-25 ENCOUNTER — Ambulatory Visit: Payer: Federal, State, Local not specified - PPO | Admitting: Physician Assistant

## 2021-12-25 VITALS — BP 156/84 | HR 87 | Ht 64.0 in | Wt 206.0 lb

## 2021-12-25 DIAGNOSIS — G43009 Migraine without aura, not intractable, without status migrainosus: Secondary | ICD-10-CM

## 2021-12-25 DIAGNOSIS — G43709 Chronic migraine without aura, not intractable, without status migrainosus: Secondary | ICD-10-CM

## 2021-12-25 DIAGNOSIS — Z1329 Encounter for screening for other suspected endocrine disorder: Secondary | ICD-10-CM | POA: Diagnosis not present

## 2021-12-25 DIAGNOSIS — Z1231 Encounter for screening mammogram for malignant neoplasm of breast: Secondary | ICD-10-CM

## 2021-12-25 DIAGNOSIS — Z13220 Encounter for screening for lipoid disorders: Secondary | ICD-10-CM | POA: Diagnosis not present

## 2021-12-25 DIAGNOSIS — Z131 Encounter for screening for diabetes mellitus: Secondary | ICD-10-CM

## 2021-12-25 DIAGNOSIS — Z79899 Other long term (current) drug therapy: Secondary | ICD-10-CM | POA: Diagnosis not present

## 2021-12-25 MED ORDER — DEXAMETHASONE SODIUM PHOSPHATE 10 MG/ML IJ SOLN
10.0000 mg | Freq: Once | INTRAMUSCULAR | Status: AC
  Administered 2021-12-25: 10 mg via INTRAMUSCULAR

## 2021-12-25 MED ORDER — AJOVY 225 MG/1.5ML ~~LOC~~ SOAJ: SUBCUTANEOUS | 5 refills | Status: DC

## 2021-12-25 MED ORDER — NURTEC 75 MG PO TBDP: 1.0000 | ORAL_TABLET | ORAL | 5 refills | Status: DC | PRN

## 2021-12-25 MED ORDER — KETOROLAC TROMETHAMINE 30 MG/ML IJ SOLN
30.0000 mg | Freq: Once | INTRAMUSCULAR | Status: AC
  Administered 2021-12-25: 30 mg via INTRAMUSCULAR

## 2021-12-25 MED ORDER — EMGALITY 120 MG/ML ~~LOC~~ SOAJ: 120.0000 mg | SUBCUTANEOUS | 5 refills | Status: DC

## 2021-12-25 MED ORDER — PROMETHAZINE HCL 25 MG/ML IJ SOLN
25.0000 mg | Freq: Once | INTRAMUSCULAR | Status: AC
  Administered 2021-12-25: 25 mg via INTRAMUSCULAR

## 2021-12-29 ENCOUNTER — Telehealth: Payer: Self-pay

## 2021-12-29 NOTE — Telephone Encounter (Signed)
Medication: Rimegepant Sulfate (NURTEC) 75 MG TBDP Prior authorization submitted via CoverMyMeds on 12/29/2021 [place] submission pending

## 2021-12-29 NOTE — Progress Notes (Signed)
Subjective:    Patient ID: Angela Oliver, female    DOB: [birth year], 56 y.o.   MRN: [id number]  HPI Pt is a 56 yo female who presents to the clinic with migraine. He has not been on medications for migraines for the last 2 months. She has been having a migraine most days. She is light sensitive. She is nauseated.    .. 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   Trochanteric bursitis of both hips 11/03/2012   Lumbar spondylosis 02/19/2014   C7 radiculopathy 12/01/2015   Obesity (BMI 30.0-34.9) 01/27/2017   Pain of right breast 07/07/2017   Family history of breast cancer in mother 07/07/2017   Carpal tunnel syndrome on both sides 11/29/2017   Dysfunction of left eustachian tube 02/01/2018   Stress reaction 01/01/2019   DDD (degenerative disc disease), lumbar 01/01/2019   Seasonal allergies 03/18/2019   Cyst of left kidney 09/10/2019   Right kidney stone 09/10/2019   Epigastric pain 11/26/2019   OAB (overactive bladder) 05/28/2020   Abdominal distension (gaseous) 05/28/2020   Abnormal brain MRI 09/30/2020   Chronic migraine without aura without status migrainosus, not intractable 01/21/2021   Migraine without aura and without status migrainosus, not intractable 01/26/2021   Resolved Ambulatory Problems    Diagnosis Date Noted   RUQ PAIN 06/09/2011   Migraines 10/23/2012   Neck muscle spasm 11/03/2012   Peroneal tendinitis of left lower extremity 12/29/2012   Trochanteric bursitis of right hip 11/22/2013   Left foot pain 11/22/2013   Foot pain, left 02/19/2014   Poison ivy dermatitis 07/04/2014   Intractable chronic migraine without aura and with status migrainosus 10/02/2014   Acute bronchitis 09/16/2015   Paresthesia of both hands 12/01/2015   Calculus of gallbladder without  cholecystitis without obstruction 05/03/2016   Symptomatic cholelithiasis 06/01/2016   S/P cholecystectomy 07/16/2016   Low back sprain 07/28/2016   Abnormal weight gain 07/07/2017   Left upper quadrant pain 05/28/2020   Greater trochanteric bursitis of both hips 04/17/2021   Past Medical History:  Diagnosis Date   Anxiety    Arthritis    Asthma    Complication of anesthesia    Family history of adverse reaction to anesthesia    GERD (gastroesophageal reflux disease)    Irregular heart beat     Review of Systems See HPI.     Objective:   Physical Exam Vitals reviewed.  Constitutional:      Appearance: Normal appearance. She is obese.  HENT:     Head: Normocephalic.     Right Ear: Tympanic membrane normal.     Left Ear: Tympanic membrane normal.     Nose: Nose normal. No congestion.  Eyes:     Extraocular Movements: Extraocular movements intact.     Pupils: Pupils are equal, round, and reactive to light.  Cardiovascular:     Rate and Rhythm: Normal rate and regular rhythm.     Pulses: Normal pulses.     Heart sounds: Normal heart sounds.  Pulmonary:     Effort: Pulmonary effort is normal.     Breath sounds: Normal breath sounds.  Musculoskeletal:     Cervical back: Normal range of motion.     Right lower leg: No edema.     Left lower leg: No edema.  Neurological:  General: No focal deficit present.     Mental Status: She is alert and oriented to person, place, and time.     Cranial Nerves: No cranial nerve deficit.     Sensory: No sensory deficit.     Motor: No weakness.     Coordination: Coordination normal.     Gait: Gait normal.     Deep Tendon Reflexes: Reflexes normal.  Psychiatric:        Mood and Affect: Mood normal.      .. Depression screen [facility] 2/9 12/25/2021 02/01/2018  Decreased Interest 0 0  Down, Depressed, Hopeless 1 0  PHQ - 2 Score 1 0  Altered sleeping 0 -  Tired, decreased energy 2 -  Change in appetite 0 -  Feeling bad or failure  about yourself  0 -  Trouble concentrating 0 -  Moving slowly or fidgety/restless 0 -  Suicidal thoughts 0 -  PHQ-9 Score 3 -  Difficult doing work/chores Somewhat difficult -  Some recent data might be hidden   .Marland Kitchen GAD 7 : Generalized Anxiety Score 12/25/2021  Nervous, Anxious, on Edge 0  Control/stop worrying 0  Worry too much - different things 1  Trouble relaxing 0  Restless 0  Easily annoyed or irritable 1  Afraid - awful might happen 0  Total GAD 7 Score 2  Anxiety Difficulty Somewhat difficult     Assessment & Plan:  Marland KitchenMarland KitchenIlda was seen today for follow-up and migraine.  Diagnoses and all orders for this visit:  Chronic migraine without aura without status migrainosus, not intractable -     Rimegepant Sulfate (NURTEC) 75 MG TBDP; Take 1 tablet by mouth as needed. -     Fremanezumab-vfrm (AJOVY) 225 MG/1.5ML SOAJ; INJECT ONCE (225 MG) INTO THE SKIN EVERY 30 (THIRTY) DAYS. -     dexamethasone (DECADRON) injection 10 mg -     promethazine (PHENERGAN) injection 25 mg -     ketorolac (TORADOL) 30 MG/ML injection 30 mg  Medication management -     TSH -     Lipid Panel w/reflex Direct LDL -     COMPLETE METABOLIC PANEL WITH GFR -     CBC with Differential/Platelet  Lipid screening -     Lipid Panel w/reflex Direct LDL  Thyroid disorder screen -     TSH  Diabetes mellitus screening -     COMPLETE METABOLIC PANEL WITH GFR  Encounter for screening mammogram for malignant neoplasm of breast -     MM 3D SCREEN BREAST BILATERAL  Migraine without aura and without status migrainosus, not intractable -     Rimegepant Sulfate (NURTEC) 75 MG TBDP; Take 1 tablet by mouth as needed. -     dexamethasone (DECADRON) injection 10 mg -     promethazine (PHENERGAN) injection 25 mg -     ketorolac (TORADOL) 30 MG/ML injection 30 mg  Other orders -     Discontinue: Galcanezumab-gnlm (EMGALITY) 120 MG/ML SOAJ; Inject 120 mg into the skin every 30 (thirty) days.   Fasting labs  ordered.  Migraine cocktail injections given in office Get back on migraine medications. Sent ajovy or emgality.  Follow up in 3 months.

## 2021-12-31 NOTE — Telephone Encounter (Signed)
Medication: Rimegepant Sulfate (NURTEC) 75 MG TBDP Prior authorization determination received Medication has been approved Approval dates: 11/29/2021-12/29/2022  Patient aware via: MyChart Pharmacy aware: Yes Provider aware via this encounter

## 2022-01-01 ENCOUNTER — Encounter: Payer: Self-pay | Admitting: Physician Assistant

## 2022-01-06 ENCOUNTER — Other Ambulatory Visit: Payer: Self-pay | Admitting: Physician Assistant

## 2022-01-06 ENCOUNTER — Encounter: Payer: Self-pay | Admitting: Sports Medicine

## 2022-01-06 DIAGNOSIS — M5412 Radiculopathy, cervical region: Secondary | ICD-10-CM

## 2022-01-19 DIAGNOSIS — M4727 Other spondylosis with radiculopathy, lumbosacral region: Secondary | ICD-10-CM | POA: Diagnosis not present

## 2022-01-19 DIAGNOSIS — M4726 Other spondylosis with radiculopathy, lumbar region: Secondary | ICD-10-CM | POA: Diagnosis not present

## 2022-01-21 ENCOUNTER — Other Ambulatory Visit: Payer: Self-pay | Admitting: Neurosurgery

## 2022-01-21 DIAGNOSIS — M4726 Other spondylosis with radiculopathy, lumbar region: Secondary | ICD-10-CM

## 2022-01-24 ENCOUNTER — Other Ambulatory Visit: Payer: Self-pay

## 2022-01-24 ENCOUNTER — Ambulatory Visit (INDEPENDENT_AMBULATORY_CARE_PROVIDER_SITE_OTHER): Payer: Federal, State, Local not specified - PPO

## 2022-01-24 DIAGNOSIS — M5416 Radiculopathy, lumbar region: Secondary | ICD-10-CM | POA: Diagnosis not present

## 2022-01-24 DIAGNOSIS — M4726 Other spondylosis with radiculopathy, lumbar region: Secondary | ICD-10-CM

## 2022-01-24 DIAGNOSIS — M47816 Spondylosis without myelopathy or radiculopathy, lumbar region: Secondary | ICD-10-CM | POA: Diagnosis not present

## 2022-01-24 DIAGNOSIS — M5136 Other intervertebral disc degeneration, lumbar region: Secondary | ICD-10-CM | POA: Diagnosis not present

## 2022-01-25 ENCOUNTER — Other Ambulatory Visit: Payer: Federal, State, Local not specified - PPO

## 2022-01-26 DIAGNOSIS — M47816 Spondylosis without myelopathy or radiculopathy, lumbar region: Secondary | ICD-10-CM | POA: Diagnosis not present

## 2022-01-26 DIAGNOSIS — Z6837 Body mass index (BMI) 37.0-37.9, adult: Secondary | ICD-10-CM | POA: Diagnosis not present

## 2022-01-26 DIAGNOSIS — R030 Elevated blood-pressure reading, without diagnosis of hypertension: Secondary | ICD-10-CM | POA: Diagnosis not present

## 2022-02-05 ENCOUNTER — Telehealth: Payer: Self-pay

## 2022-02-05 NOTE — Telephone Encounter (Signed)
Initiated Prior authorization [id number] 120MG /ML auto-injectors (migraine) Via: Covermymeds Case/Key:[id number] Status: Pending as of 02/05/22 Reason: Notified Pt via: Mychart

## 2022-02-17 ENCOUNTER — Ambulatory Visit (INDEPENDENT_AMBULATORY_CARE_PROVIDER_SITE_OTHER): Payer: Federal, State, Local not specified - PPO | Admitting: Physician Assistant

## 2022-02-17 ENCOUNTER — Other Ambulatory Visit: Payer: Self-pay

## 2022-02-17 VITALS — BP 157/87 | HR 89 | Ht 64.0 in | Wt 203.0 lb

## 2022-02-17 DIAGNOSIS — M7062 Trochanteric bursitis, left hip: Secondary | ICD-10-CM | POA: Diagnosis not present

## 2022-02-17 DIAGNOSIS — M5136 Other intervertebral disc degeneration, lumbar region: Secondary | ICD-10-CM | POA: Diagnosis not present

## 2022-02-17 DIAGNOSIS — G43709 Chronic migraine without aura, not intractable, without status migrainosus: Secondary | ICD-10-CM

## 2022-02-17 DIAGNOSIS — M7061 Trochanteric bursitis, right hip: Secondary | ICD-10-CM | POA: Diagnosis not present

## 2022-02-17 MED ORDER — AJOVY 225 MG/1.5ML ~~LOC~~ SOAJ: SUBCUTANEOUS | 5 refills | Status: DC

## 2022-02-17 MED ORDER — DULOXETINE HCL 60 MG PO CPEP
60.0000 mg | ORAL_CAPSULE | Freq: Every day | ORAL | 0 refills | Status: DC
Start: 2022-02-17 — End: 2022-05-18

## 2022-02-17 MED ORDER — METHOCARBAMOL 500 MG PO TABS: 500.0000 mg | ORAL_TABLET | Freq: Three times a day (TID) | ORAL | 5 refills | Status: DC

## 2022-02-17 MED ORDER — RIZATRIPTAN BENZOATE 10 MG PO TBDP: 10.0000 mg | ORAL_TABLET | ORAL | 5 refills | Status: DC | PRN

## 2022-02-17 NOTE — Patient Instructions (Signed)
Pregabalin Capsules °What is this medication? °PREGABALIN (pre GAB a lin) treats nerve pain. It may also be used to prevent and control seizures in people with epilepsy. It works by calming overactive nerves in your body. °This medicine may be used for other purposes; ask your health care provider or pharmacist if you have questions. °COMMON BRAND NAME(S): Lyrica °What should I tell my care team before I take this medication? °They need to know if you have any of these conditions: °Drug abuse or addiction °Heart failure °Kidney disease °Lung disease °Suicidal thoughts, plans or attempt °An unusual or allergic reaction to pregabalin, other medications, foods, dyes, or preservatives °Pregnant or trying to get pregnant °Breast-feeding °How should I use this medication? °Take this medication by mouth with water. Take it as directed on the prescription label at the same time every day. You can take it with or without food. If it upsets your stomach, take it with food. Keep taking it unless your care team tells you to stop. °A special MedGuide will be given to you by the pharmacist with each prescription and refill. Be sure to read this information carefully each time. °Talk to your care team about the use of this medication in children. While it may be prescribed for children as young as 1 month for selected conditions, precautions do apply. °Overdosage: If you think you have taken too much of this medicine contact a poison control center or emergency room at once. °NOTE: This medicine is only for you. Do not share this medicine with others. °What if I miss a dose? °If you miss a dose, take it as soon as you can. If it is almost time for your next dose, take only that dose. Do not take double or extra doses. °What may interact with this medication? °Alcohol °Antihistamines for allergy, cough, and cold °Certain medications for anxiety or sleep °Certain medications for blood pressure, heart disease °Certain medications for  depression like amitriptyline, fluoxetine, sertraline °Certain medications for diabetes, like pioglitazone, rosiglitazone °Certain medications for seizures like phenobarbital, primidone °General anesthetics like halothane, isoflurane, methoxyflurane, propofol °Medications that relax muscles for surgery °Narcotic medications for pain °Phenothiazines like chlorpromazine, mesoridazine, prochlorperazine, thioridazine °This list may not describe all possible interactions. Give your health care provider a list of all the medicines, herbs, non-prescription drugs, or dietary supplements you use. Also tell them if you smoke, drink alcohol, or use illegal drugs. Some items may interact with your medicine. °What should I watch for while using this medication? °Visit your care team for regular checks on your progress. Tell your care team if your symptoms do not start to get better or if they get worse. °Do not suddenly stop taking this medication. You may develop a severe reaction. Your care team will tell you how much medication to take. If your care team wants you to stop the medication, the dose may be slowly lowered over time to avoid any side effects. °You may get drowsy or dizzy. Do not drive, use machinery, or do anything that needs mental alertness until you know how this medication affects you. Do not stand up or sit up quickly, especially if you are an older patient. This reduces the risk of dizzy or fainting spells. Alcohol may interfere with the effect of this medication. Avoid alcoholic drinks. °If you or your family notice any changes in your behavior, such as new or worsening depression, thoughts of harming yourself, anxiety, other unusual or disturbing thoughts, or memory loss, call your care   team right away. °Wear a medical ID bracelet or chain if you are taking this medication for seizures. Carry a card that describes your condition. List the medications and doses you take on the card. °This medication may  make it more difficult to father a child. Talk to your care team if you are concerned about your fertility. °What side effects may I notice from receiving this medication? °Side effects that you should report to your care team as soon as possible: °Allergic reactions or angioedema--skin rash, itching, hives, swelling of the face, eyes, lips, tongue, arms, or legs, trouble swallowing or breathing °Blurry vision °Thoughts of suicide or self-harm, worsening mood, feelings of depression °Trouble breathing °Side effects that usually do not require medical attention (report to your care team if they continue or are bothersome): °Dizziness °Drowsiness °Dry mouth °Nausea °Swelling of the ankles, feet, hands °Vomiting °Weight gain °This list may not describe all possible side effects. Call your doctor for medical advice about side effects. You may report side effects to FDA at 1-800-FDA-1088. °Where should I keep my medication? °Keep out of the reach of children and pets. This medication can be abused. Keep it in a safe place to protect it from theft. Do not share it with anyone. It is only for you. Selling or giving away this medication is dangerous and against the law. °Store at 25 degrees C (77 degrees F). Get rid of any unused medication after the expiration date. °This medication may cause harm and death if it is taken by other adults, children, or pets. It is important to get rid of the medication as soon as you no longer need it, or it is expired. You can do this in two ways: °Take the medication to a medication take-back program. Check with your pharmacy or law enforcement to find a location. °If you cannot return the medication, check the label or package insert to see if the medication should be thrown out in the garbage or flushed down the toilet. If you are not sure, ask your care team. If it is safe to put it in the trash, take the medication out of the container. Mix the medication with cat litter, dirt, coffee  grounds, or other unwanted substance. Seal the mixture in a bag or container. Put it in the trash. °NOTE: This sheet is a summary. It may not cover all possible information. If you have questions about this medicine, talk to your doctor, pharmacist, or health care provider. °© 2022 [employer] (2020-12-03 00:00:00) ° °

## 2022-02-17 NOTE — Progress Notes (Incomplete)
Subjective:    Patient ID: Frayda Egley, female    DOB: Nov 06, 1966, 56 y.o.   MRN: [id number]  HPI 12  .Marland Kitchen 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   Trochanteric bursitis of both hips 11/03/2012   Lumbar spondylosis 02/19/2014   C7 radiculopathy 12/01/2015   Obesity (BMI 30.0-34.9) 01/27/2017   Pain of right breast 07/07/2017   Family history of breast cancer in mother 07/07/2017   Carpal tunnel syndrome on both sides 11/29/2017   Dysfunction of left eustachian tube 02/01/2018   Stress reaction 01/01/2019   DDD (degenerative disc disease), lumbar 01/01/2019   Seasonal allergies 03/18/2019   Cyst of left kidney 09/10/2019   Right kidney stone 09/10/2019   Epigastric pain 11/26/2019   OAB (overactive bladder) 05/28/2020   Abdominal distension (gaseous) 05/28/2020   Abnormal brain MRI 09/30/2020   Chronic migraine without aura without status migrainosus, not intractable 01/21/2021   Migraine without aura and without status migrainosus, not intractable 01/26/2021   Resolved Ambulatory Problems    Diagnosis Date Noted   RUQ PAIN 06/09/2011   Migraines 10/23/2012   Neck muscle spasm 11/03/2012   Peroneal tendinitis of left lower extremity 12/29/2012   Trochanteric bursitis of right hip 11/22/2013   Left foot pain 11/22/2013   Foot pain, left 02/19/2014   Poison ivy dermatitis 07/04/2014   Intractable chronic migraine without aura and with status migrainosus 10/02/2014   Acute bronchitis 09/16/2015   Paresthesia of both hands 12/01/2015   Calculus of gallbladder without cholecystitis without obstruction 05/03/2016   Symptomatic cholelithiasis 06/01/2016   S/P cholecystectomy 07/16/2016   Low back sprain 07/28/2016   Abnormal weight gain 07/07/2017    Left upper quadrant pain 05/28/2020   Greater trochanteric bursitis of both hips 04/17/2021   Past Medical History:  Diagnosis Date   Anxiety    Arthritis    Asthma    Complication of anesthesia    Family history of adverse reaction to anesthesia    GERD (gastroesophageal reflux disease)    Irregular heart beat     Review of Systems See HPI.     Objective:   Physical Exam   Bursa Injection Procedure Note   Pre-operative Diagnosis: bilateral Trochanteric bursitis  Post-operative Diagnosis: same  Indications: Diagnosis and treatment of symptomatic bursal effusion  Anesthesia: not required   Procedure Details   After a discussion of the risks and benefits with the patient (including the possibility that any manipulation of the bursa could introduce infection, worsening the current situation significantly), verbal consent was obtained for the procedure. The joint was prepped with Betadine and a small wheel of local anesthesia was injected into the subcutaneous tissue. An 18 gauge needle was introduced into the fluid collection and 32mL of kenalog 40mg  in bilateral lateral knee pta. The injection site was cleansed with topical isopropyl alcohol and a dressing was applied.  Complications:  None; patient tolerated the procedure well.    Assessment & Plan:  Marland KitchenGriffin was seen today for headache.  Diagnoses and all orders for this visit:  Greater trochanteric bursitis of both hips  Chronic migraine without aura without status migrainosus, not intractable -     Fremanezumab-vfrm (AJOVY) 225 MG/1.5ML SOAJ; INJECT ONCE (225 MG) INTO THE SKIN EVERY 30 (THIRTY) DAYS.  DDD (degenerative disc disease), lumbar  Other  orders -     methocarbamol (ROBAXIN) 500 MG tablet; Take 1 tablet (500 mg total) by mouth 3 (three) times daily. -     rizatriptan (MAXALT-MLT) 10 MG disintegrating tablet; Take 1 tablet (10 mg total) by mouth as needed for migraine. May repeat in 2 hours if  needed -     DULoxetine (CYMBALTA) 60 MG capsule; Take 1 capsule (60 mg total) by mouth daily.

## 2022-02-22 ENCOUNTER — Encounter: Payer: Self-pay | Admitting: Physician Assistant

## 2022-03-05 ENCOUNTER — Telehealth: Payer: Self-pay

## 2022-03-05 NOTE — Telephone Encounter (Signed)
resubmission Prior authorization TFT:[id number] 120MG /ML auto-injectors (migraine) ?Via: Covermymeds ? ?Status: approved as of 03/03/22 ?Reason:approved through 08/30/22 ?Notified Pt via: Mychart ?

## 2022-04-19 ENCOUNTER — Other Ambulatory Visit: Payer: Self-pay | Admitting: Physician Assistant

## 2022-04-19 ENCOUNTER — Other Ambulatory Visit: Payer: Self-pay | Admitting: Family Medicine

## 2022-04-19 DIAGNOSIS — K219 Gastro-esophageal reflux disease without esophagitis: Secondary | ICD-10-CM

## 2022-04-19 DIAGNOSIS — M5136 Other intervertebral disc degeneration, lumbar region: Secondary | ICD-10-CM

## 2022-04-19 DIAGNOSIS — J3089 Other allergic rhinitis: Secondary | ICD-10-CM

## 2022-04-19 DIAGNOSIS — J4530 Mild persistent asthma, uncomplicated: Secondary | ICD-10-CM

## 2022-04-27 ENCOUNTER — Encounter: Payer: Self-pay | Admitting: Physician Assistant

## 2022-04-27 NOTE — Telephone Encounter (Signed)
I don't see anything scanned in. Do you still have a copy of this?  ?

## 2022-04-27 NOTE — Telephone Encounter (Signed)
Well since he originated this one can you fill out another one and get it to the patient? ?

## 2022-04-29 NOTE — Telephone Encounter (Signed)
Scanned and sent form via MyChart to patient. AMUCK

## 2022-05-14 DIAGNOSIS — M47816 Spondylosis without myelopathy or radiculopathy, lumbar region: Secondary | ICD-10-CM | POA: Diagnosis not present

## 2022-05-17 ENCOUNTER — Other Ambulatory Visit: Payer: Self-pay | Admitting: Physician Assistant

## 2022-05-17 DIAGNOSIS — M5136 Other intervertebral disc degeneration, lumbar region: Secondary | ICD-10-CM

## 2022-05-17 DIAGNOSIS — M5412 Radiculopathy, cervical region: Secondary | ICD-10-CM

## 2022-05-18 NOTE — Telephone Encounter (Signed)
[facility] requesting med refills for duloxetine 30 mg & 60 mg. Please verify the correct dose patient should be taking. Thanks in advance.

## 2022-05-21 ENCOUNTER — Ambulatory Visit (INDEPENDENT_AMBULATORY_CARE_PROVIDER_SITE_OTHER): Payer: Federal, State, Local not specified - PPO

## 2022-05-21 ENCOUNTER — Ambulatory Visit: Payer: Federal, State, Local not specified - PPO | Admitting: Sports Medicine

## 2022-05-21 ENCOUNTER — Encounter: Payer: Self-pay | Admitting: Sports Medicine

## 2022-05-21 ENCOUNTER — Other Ambulatory Visit: Payer: Self-pay | Admitting: Sports Medicine

## 2022-05-21 DIAGNOSIS — M7661 Achilles tendinitis, right leg: Secondary | ICD-10-CM | POA: Insufficient documentation

## 2022-05-21 DIAGNOSIS — M722 Plantar fascial fibromatosis: Secondary | ICD-10-CM | POA: Diagnosis not present

## 2022-05-21 MED ORDER — HYDROCODONE-IBUPROFEN 5-200 MG PO TABS: 1.0000 | ORAL_TABLET | Freq: Two times a day (BID) | ORAL | 0 refills | Status: DC

## 2022-05-21 NOTE — Progress Notes (Signed)
    Procedures performed today:    Procedure: Real-time Ultrasound Guided injection of the right retrocalcaneal bursa Device: Samsung HS60  Verbal informed consent obtained.  Time-out conducted.  Noted no overlying erythema, induration, or other signs of local infection.  Skin prepped in a sterile fashion.  Local anesthesia: Topical Ethyl chloride.  With sterile technique and under real time ultrasound guidance: Mild retrocalcaneal bursitis noted, insertional Achilles calcifications noted, 1 cc Kenalog 40, 1 cc lidocaine, 1 cc bupivacaine injected easily Completed without difficulty  Advised to call if fevers/chills, erythema, induration, drainage, or persistent bleeding.  Images permanently stored and available for review in PACS.  Impression: Technically successful ultrasound guided injection.  Independent interpretation of notes and tests performed by another provider:   None.  Brief History, Exam, Impression, and Recommendations:    Insertional Achilles tendinitis, right leg, retrocalcaneal bursitis Increasing right posterior heel pain, tenderness at the Achilles insertion, retrocalcaneal bursa. Severe pain, patient requesting injection so injection performed today taking care to avoid intra Achilles injection. She did have insertional Achilles calcifications and a mild retrocalcaneal bursitis. She will wear a boot for a week to protect the Achilles, adding Achilles conditioning and heel lifts. Return to see me in 6 weeks.    ___________________________________________ [name]. Benjamin Stain, M.D., ABFM., CAQSM. Primary Care and Sports Medicine [facility]  Adjunct Instructor of Family Medicine  [employer]

## 2022-05-21 NOTE — Assessment & Plan Note (Deleted)
Has a few sets of custom orthotics, last injection was probably 8 or 9 years ago. Increasing pain right plantar heel, injected today due to severity of pain, due to home condition, continue orthotics, avoid barefoot walking, return as needed.

## 2022-05-21 NOTE — Assessment & Plan Note (Signed)
Increasing right posterior heel pain, tenderness at the Achilles insertion, retrocalcaneal bursa. Severe pain, patient requesting injection so injection performed today taking care to avoid intra Achilles injection. She did have insertional Achilles calcifications and a mild retrocalcaneal bursitis. She will wear a boot for a week to protect the Achilles, adding Achilles conditioning and heel lifts. Return to see me in 6 weeks.

## 2022-05-21 NOTE — Patient Instructions (Signed)
Begin with easy walking, heel, toe and backwards  Do calf raises on a step:  First lower and then raise on 1 foot  If this is painful, lower on 1 foot, do the heel raise on both feet Begin with 3 sets of 10 repetitions  Increase by 5 repetitions every 3 days  Goal is 3 sets of 30 repetitions  Do with both straight knee and knee at 20 degrees of flexion  If pain persists, once you can do 3 sets of 30 without weight, add backpack with 5 lbs.  Increase by 5 lbs per week to max of 30 lbs for 3 sets of 15   

## 2022-06-25 ENCOUNTER — Ambulatory Visit: Payer: Federal, State, Local not specified - PPO | Admitting: Physician Assistant

## 2022-06-25 ENCOUNTER — Encounter: Payer: Self-pay | Admitting: Physician Assistant

## 2022-06-25 VITALS — BP 141/78 | HR 86 | Ht 64.0 in | Wt 206.0 lb

## 2022-06-25 DIAGNOSIS — G43709 Chronic migraine without aura, not intractable, without status migrainosus: Secondary | ICD-10-CM | POA: Diagnosis not present

## 2022-06-25 DIAGNOSIS — M47816 Spondylosis without myelopathy or radiculopathy, lumbar region: Secondary | ICD-10-CM | POA: Diagnosis not present

## 2022-06-25 DIAGNOSIS — M5136 Other intervertebral disc degeneration, lumbar region: Secondary | ICD-10-CM

## 2022-06-25 MED ORDER — IBUPROFEN 800 MG PO TABS: 800.0000 mg | ORAL_TABLET | Freq: Three times a day (TID) | ORAL | 1 refills | Status: DC | PRN

## 2022-06-25 MED ORDER — DEXAMETHASONE SODIUM PHOSPHATE 10 MG/ML IJ SOLN
10.0000 mg | Freq: Once | INTRAMUSCULAR | Status: AC
  Administered 2022-06-25: 10 mg via INTRAMUSCULAR

## 2022-06-25 MED ORDER — HYDROCODONE-ACETAMINOPHEN 5-325 MG PO TABS: 1.0000 | ORAL_TABLET | Freq: Three times a day (TID) | ORAL | 0 refills | Status: AC | PRN

## 2022-06-25 MED ORDER — QULIPTA 30 MG PO TABS: ORAL_TABLET | ORAL | 1 refills | Status: DC

## 2022-06-25 MED ORDER — KETOROLAC TROMETHAMINE 30 MG/ML IJ SOLN
30.0000 mg | Freq: Once | INTRAMUSCULAR | Status: AC
  Administered 2022-06-25: 30 mg via INTRAMUSCULAR

## 2022-06-25 NOTE — Progress Notes (Signed)
   Established Patient Office Visit  Subjective   Patient ID: Parisa Pinela, female    DOB: 02-Dec-1966  Age: 56 y.o. MRN: [id number]  Chief Complaint  Patient presents with  . Follow-up    HPI  3 days migraine   {History (Optional):23778}  ROS    Objective:     BP (!) 141/78   Pulse 86   Ht 5\' 4"  (1.626 m)   Wt 206 lb (93.4 kg)   LMP 11/12/2016 (Approximate)   SpO2 100%   BMI 35.36 kg/m  {Vitals History (Optional):23777}  Physical Exam   No results found for any visits on 06/25/22.  {Labs (Optional):23779}  The ASCVD Risk score (Arnett DK, et al., 2019) failed to calculate for the following reasons:   Cannot find a previous HDL lab   Cannot find a previous total cholesterol lab    Assessment & Plan:   Problem List Items Addressed This Visit   None   No follow-ups on file.    2020, PA-C

## 2022-06-25 NOTE — Patient Instructions (Signed)
Trial of qulipta to replace emgaility.

## 2022-07-02 ENCOUNTER — Ambulatory Visit (INDEPENDENT_AMBULATORY_CARE_PROVIDER_SITE_OTHER): Payer: Federal, State, Local not specified - PPO

## 2022-07-02 ENCOUNTER — Ambulatory Visit: Payer: Federal, State, Local not specified - PPO | Admitting: Sports Medicine

## 2022-07-02 DIAGNOSIS — M5136 Other intervertebral disc degeneration, lumbar region: Secondary | ICD-10-CM | POA: Diagnosis not present

## 2022-07-02 DIAGNOSIS — M7061 Trochanteric bursitis, right hip: Secondary | ICD-10-CM

## 2022-07-02 DIAGNOSIS — M51369 Other intervertebral disc degeneration, lumbar region without mention of lumbar back pain or lower extremity pain: Secondary | ICD-10-CM

## 2022-07-02 DIAGNOSIS — M7661 Achilles tendinitis, right leg: Secondary | ICD-10-CM | POA: Diagnosis not present

## 2022-07-02 DIAGNOSIS — M7062 Trochanteric bursitis, left hip: Secondary | ICD-10-CM | POA: Diagnosis not present

## 2022-07-02 NOTE — Assessment & Plan Note (Signed)
Pain-free now after retrocalcaneal bursa injection at the last visit.

## 2022-07-02 NOTE — Assessment & Plan Note (Signed)
Pleasant 56 year old female, bilateral hip pain, historically has gotten trochanteric bursa injections, today she has pain bilaterally on the greater trochanters, repeat bilateral injections today with ultrasound guidance.

## 2022-07-02 NOTE — Assessment & Plan Note (Signed)
Has been working with neurosurgery and Dr. Lorrine Kin, needs a repeat referral for epidural with Dr. Lorrine Kin,

## 2022-07-02 NOTE — Progress Notes (Signed)
    Procedures performed today:    Procedure: Real-time Ultrasound Guided injection of the right greater trochanteric bursa Device: Samsung HS60  Verbal informed consent obtained.  Time-out conducted.  Noted no overlying erythema, induration, or other signs of local infection.  Skin prepped in a sterile fashion.  Local anesthesia: Topical Ethyl chloride.  With sterile technique and under real time ultrasound guidance: Mild bursitis, 1 cc Kenalog 40, 2 cc lidocaine, 2 cc bupivacaine injected easily Completed without difficulty  Advised to call if fevers/chills, erythema, induration, drainage, or persistent bleeding.  Images permanently stored and available for review in PACS.  Impression: Technically successful ultrasound guided injection.  Procedure: Real-time Ultrasound Guided injection of the left greater trochanteric bursa Device: Samsung HS60  Verbal informed consent obtained.  Time-out conducted.  Noted no overlying erythema, induration, or other signs of local infection.  Skin prepped in a sterile fashion.  Local anesthesia: Topical Ethyl chloride.  With sterile technique and under real time ultrasound guidance: Mild bursitis, 1 cc Kenalog 40, 2 cc lidocaine, 2 cc bupivacaine injected easily Completed without difficulty  Advised to call if fevers/chills, erythema, induration, drainage, or persistent bleeding.  Images permanently stored and available for review in PACS.  Impression: Technically successful ultrasound guided injection.  Independent interpretation of notes and tests performed by another provider:   None.  Brief History, Exam, Impression, and Recommendations:    Trochanteric bursitis of both hips Pleasant 56 year old female, bilateral hip pain, historically has gotten trochanteric bursa injections, today she has pain bilaterally on the greater trochanters, repeat bilateral injections today with ultrasound guidance.  Insertional Achilles tendinitis, right leg,  retrocalcaneal bursitis Pain-free now after retrocalcaneal bursa injection at the last visit.  DDD (degenerative disc disease), lumbar Has been working with neurosurgery and Dr. Lorrine Kin, needs a repeat referral for epidural with Dr. Lorrine Kin,  Chronic process with exacerbation and pharmacologic intervention  ____________________________________________ [name]. Benjamin Stain, M.D., ABFM., CAQSM., AME. Primary Care and Sports Medicine [facility]  Adjunct Professor of Family Medicine  [employer]  Restaurant manager, fast food

## 2022-07-08 ENCOUNTER — Telehealth: Payer: Self-pay

## 2022-07-08 NOTE — Telephone Encounter (Addendum)
Initiated Prior authorization OPF:[id number] 30MG  tablets Via: Covermymeds Case/Key:[id number] Status: approved  as of 07/08/22 Reason:The authorization is valid for 06/08/2022 through 07/08/2023. Notified Pt via: Mychart

## 2022-07-16 ENCOUNTER — Other Ambulatory Visit: Payer: Self-pay | Admitting: Physician Assistant

## 2022-07-16 DIAGNOSIS — M5412 Radiculopathy, cervical region: Secondary | ICD-10-CM

## 2022-07-16 DIAGNOSIS — K219 Gastro-esophageal reflux disease without esophagitis: Secondary | ICD-10-CM

## 2022-07-23 ENCOUNTER — Encounter: Payer: Self-pay | Admitting: Medical-Surgical

## 2022-07-23 ENCOUNTER — Ambulatory Visit: Payer: Federal, State, Local not specified - PPO | Admitting: Medical-Surgical

## 2022-07-23 ENCOUNTER — Ambulatory Visit (INDEPENDENT_AMBULATORY_CARE_PROVIDER_SITE_OTHER): Payer: Federal, State, Local not specified - PPO

## 2022-07-23 VITALS — BP 131/82 | HR 86 | Resp 20 | Ht 64.0 in | Wt 204.2 lb

## 2022-07-23 DIAGNOSIS — R1031 Right lower quadrant pain: Secondary | ICD-10-CM

## 2022-07-23 DIAGNOSIS — R319 Hematuria, unspecified: Secondary | ICD-10-CM | POA: Diagnosis not present

## 2022-07-23 DIAGNOSIS — R109 Unspecified abdominal pain: Secondary | ICD-10-CM

## 2022-07-23 DIAGNOSIS — R112 Nausea with vomiting, unspecified: Secondary | ICD-10-CM

## 2022-07-23 DIAGNOSIS — N200 Calculus of kidney: Secondary | ICD-10-CM

## 2022-07-23 DIAGNOSIS — N132 Hydronephrosis with renal and ureteral calculous obstruction: Secondary | ICD-10-CM | POA: Diagnosis not present

## 2022-07-23 DIAGNOSIS — R10A1 Flank pain, right side: Secondary | ICD-10-CM

## 2022-07-23 DIAGNOSIS — Z9049 Acquired absence of other specified parts of digestive tract: Secondary | ICD-10-CM | POA: Diagnosis not present

## 2022-07-23 LAB — POCT URINALYSIS DIP (CLINITEK)
Glucose, UA: NEGATIVE mg/dL
Ketones, POC UA: NEGATIVE mg/dL
Leukocytes, UA: NEGATIVE
Nitrite, UA: NEGATIVE
POC PROTEIN,UA: 100 — AB
Spec Grav, UA: >=1.03 — AB (ref 1.010–1.025)
Urobilinogen, UA: 0.2 E.U./dL
pH, UA: 6 (ref 5.0–8.0)

## 2022-07-23 MED ORDER — ONDANSETRON 4 MG PO TBDP
8.0000 mg | ORAL_TABLET | Freq: Once | ORAL | Status: AC
  Administered 2022-07-23: 8 mg via ORAL

## 2022-07-23 MED ORDER — ONDANSETRON 8 MG PO TBDP: 8.0000 mg | ORAL_TABLET | Freq: Three times a day (TID) | ORAL | 3 refills | Status: DC | PRN

## 2022-07-23 NOTE — Progress Notes (Signed)
Established Patient Office Visit  Subjective   Patient ID: Angela Oliver, female   DOB: 06-Apr-1966 Age: 56 y.o. MRN: [id number]   Chief Complaint  Patient presents with   Nephrolithiasis    HPI Pleasant 56 year old female presenting today with reports of right flank pain that radiates to the abdomen and groin. Symptoms started last night with sudden onset. Feeling weak, lightheaded, and nauseated. Ended up vomiting last night. Having difficutly keep even liquids down. Known history of kidney stones but her current symptoms don't feel like this.  Prior kidney stones were accompanied by sharp and stabbing pain.  This is more of an ache. Had some chills this morning but no fever.  Has tried pushing fluids however she has been unable to hold down liquids or solids since last night.   Objective:    Vitals:   07/23/22 1623  BP: 131/82  Pulse: 86  Resp: 20  Height: 5\' 4"  (1.626 m)  Weight: 204 lb 3.2 oz (92.6 kg)  SpO2: 98%  BMI (Calculated): 35.03    Physical Exam Vitals and nursing note reviewed.  Constitutional:      General: She is not in acute distress.    Appearance: Normal appearance. She is not ill-appearing.  HENT:     Head: Normocephalic and atraumatic.  Cardiovascular:     Rate and Rhythm: Normal rate and regular rhythm.     Pulses: Normal pulses.     Heart sounds: Normal heart sounds.  Pulmonary:     Effort: Pulmonary effort is normal. No respiratory distress.     Breath sounds: Normal breath sounds. No wheezing, rhonchi (cbc) or rales.  Skin:    General: Skin is warm and dry.  Neurological:     Mental Status: She is alert and oriented to person, place, and time.  Psychiatric:        Mood and Affect: Mood normal.        Behavior: Behavior normal.        Thought Content: Thought content normal.        Judgment: Judgment normal.    Results for orders placed or performed in visit on 07/23/22 (from the past 24 hour(s))  POCT URINALYSIS DIP (CLINITEK)     Status:  Abnormal   Collection Time: 07/23/22  4:18 PM  Result Value Ref Range   Color, UA yellow yellow   Clarity, UA clear clear   Glucose, UA negative negative mg/dL   Bilirubin, UA small (A) negative   Ketones, POC UA negative negative mg/dL   Spec Grav, UA 09/22/22 (A) 1.010 - 1.025   Blood, UA large (A) negative   pH, UA 6.0 5.0 - 8.0   POC PROTEIN,UA =100 (A) negative, trace   Urobilinogen, UA 0.2 0.2 or 1.0 E.U./dL   Nitrite, UA Negative Negative   Leukocytes, UA Negative Negative       The ASCVD Risk score (Arnett DK, et al., 2019) failed to calculate for the following reasons:   Cannot find a previous HDL lab   Cannot find a previous total cholesterol lab   Assessment & Plan:   1. Right flank pain Stat CT renal stone study.  Checking blood counts and renal function.  POCT urinalysis showing elevated specific gravity, small bilirubin, large blood, and protein but negative for nitrites and leukocytes.  With her history and current presentation, suspect that this is related to kidney stones.  We will go ahead and send her urine for culture to rule out infection. - CT  RENAL STONE STUDY; Future - CBC with Differential - COMPLETE METABOLIC PANEL WITH GFR - POCT URINALYSIS DIP (CLINITEK) - Urine Culture  2. Nephrolithiasis Stat CT renal stone study showing multiple less than 5 mm stones in her bilateral kidneys as well as a 3 mm stone that is in the right ureter.  She does have an allergy to sulfa drugs so we will hold off on Flomax for now.  Advised to go home and make sure to push fluids.  Continue hydrocodone (already has prescription at home) for pain management.  She does have some mild hydronephrosis so would like for her to monitor his symptoms very closely and if there is a worsening of pain or new symptoms develop, she should report immediately to the emergency room.  Called her with the results of the stat imaging and patient verbalized understanding and is agreeable to the  plan.  3. Nausea and vomiting, unspecified vomiting type Zofran ODT 8 mg given in office with good relief of nausea.  Sending in a prescription for her to use as needed at home. - ondansetron (ZOFRAN-ODT) disintegrating tablet 8 mg  Return if symptoms worsen or fail to improve.  ___________________________________________ Thayer Ohm, DNP, APRN, FNP-BC Primary Care and Sports Medicine [facility]

## 2022-07-24 LAB — CBC WITH DIFFERENTIAL/PLATELET
Absolute Monocytes: 812 cells/uL (ref 200–950)
Basophils Absolute: 35 cells/uL (ref 0–200)
Basophils Relative: 0.3 %
Eosinophils Absolute: 58 cells/uL (ref 15–500)
Eosinophils Relative: 0.5 %
HCT: 41.9 % (ref 35.0–45.0)
Hemoglobin: 14.7 g/dL (ref 11.7–15.5)
Lymphs Abs: 2575 cells/uL (ref 850–3900)
MCH: 31.4 pg (ref 27.0–33.0)
MCHC: 35.1 g/dL (ref 32.0–36.0)
MCV: 89.5 fL (ref 80.0–100.0)
MPV: 9.8 fL (ref 7.5–12.5)
Monocytes Relative: 7 %
Neutro Abs: 8120 cells/uL — ABNORMAL HIGH (ref 1500–7800)
Neutrophils Relative %: 70 %
Platelets: 279 10*3/uL (ref 140–400)
RBC: 4.68 10*6/uL (ref 3.80–5.10)
RDW: 12.6 % (ref 11.0–15.0)
Total Lymphocyte: 22.2 %
WBC: 11.6 10*3/uL — ABNORMAL HIGH (ref 3.8–10.8)

## 2022-07-24 LAB — COMPLETE METABOLIC PANEL WITH GFR
AG Ratio: 1.7 (calc) (ref 1.0–2.5)
ALT: 18 U/L (ref 6–29)
AST: 14 U/L (ref 10–35)
Albumin: 4.3 g/dL (ref 3.6–5.1)
Alkaline phosphatase (APISO): 69 U/L (ref 37–153)
BUN/Creatinine Ratio: 18 (calc) (ref 6–22)
BUN: 21 mg/dL (ref 7–25)
CO2: 27 mmol/L (ref 20–32)
Calcium: 9.6 mg/dL (ref 8.6–10.4)
Chloride: 104 mmol/L (ref 98–110)
Creat: 1.17 mg/dL — ABNORMAL HIGH (ref 0.50–1.03)
Globulin: 2.5 g/dL (calc) (ref 1.9–3.7)
Glucose, Bld: 104 mg/dL — ABNORMAL HIGH (ref 65–99)
Potassium: 4.3 mmol/L (ref 3.5–5.3)
Sodium: 140 mmol/L (ref 135–146)
Total Bilirubin: 1.4 mg/dL — ABNORMAL HIGH (ref 0.2–1.2)
Total Protein: 6.8 g/dL (ref 6.1–8.1)
eGFR: 54 mL/min/{1.73_m2} — ABNORMAL LOW (ref >=60)

## 2022-07-25 LAB — URINE CULTURE
MICRO NUMBER:: 13771457
Result:: NO GROWTH
SPECIMEN QUALITY:: ADEQUATE

## 2022-07-26 ENCOUNTER — Telehealth: Payer: Self-pay

## 2022-07-26 DIAGNOSIS — N201 Calculus of ureter: Secondary | ICD-10-CM | POA: Diagnosis not present

## 2022-07-26 NOTE — Telephone Encounter (Signed)
-----   Message from Christen Butter, NP sent at 07/26/2022  6:50 AM EDT ----- Please contact patient to see how she is doing. She had a kidney stone being passed on 04/08/23 and was very miserable. I have put a referral in for Urology (urgent) but wanted to make sure she is doing better and holding food/fluids down.  Thanks, Joy  ----- Message ----- From: Christen Butter, NP Sent: 07/26/2022  12:00 AM EDT To: Christen Butter, NP  Check in on Angela Oliver to see how she is doing

## 2022-07-26 NOTE — Telephone Encounter (Signed)
Spoke with pt who states that her pain has decreased, but she is still having some residual pains.  She states that she had gross hematuria thru her first urination this morning, but is now clear.  She does have an appt with urology for 1pm today.   Tiajuana Amass, CMA

## 2022-07-26 NOTE — Addendum Note (Signed)
Addended byChristen Butter on: 07/26/2022 07:52 AM   Modules accepted: Orders

## 2022-08-03 DIAGNOSIS — M461 Sacroiliitis, not elsewhere classified: Secondary | ICD-10-CM | POA: Diagnosis not present

## 2022-08-12 DIAGNOSIS — N132 Hydronephrosis with renal and ureteral calculous obstruction: Secondary | ICD-10-CM | POA: Diagnosis not present

## 2022-08-27 ENCOUNTER — Encounter: Payer: Self-pay | Admitting: Physician Assistant

## 2022-08-27 DIAGNOSIS — M5136 Other intervertebral disc degeneration, lumbar region: Secondary | ICD-10-CM

## 2022-08-27 DIAGNOSIS — M47816 Spondylosis without myelopathy or radiculopathy, lumbar region: Secondary | ICD-10-CM

## 2022-08-27 DIAGNOSIS — M5412 Radiculopathy, cervical region: Secondary | ICD-10-CM

## 2022-08-28 DIAGNOSIS — Y93K1 Activity, walking an animal: Secondary | ICD-10-CM | POA: Diagnosis not present

## 2022-08-28 DIAGNOSIS — I10 Essential (primary) hypertension: Secondary | ICD-10-CM | POA: Diagnosis not present

## 2022-08-28 DIAGNOSIS — M545 Low back pain, unspecified: Secondary | ICD-10-CM | POA: Diagnosis not present

## 2022-08-28 DIAGNOSIS — Z888 Allergy status to other drugs, medicaments and biological substances status: Secondary | ICD-10-CM | POA: Diagnosis not present

## 2022-08-28 DIAGNOSIS — W19XXXA Unspecified fall, initial encounter: Secondary | ICD-10-CM | POA: Diagnosis not present

## 2022-08-28 DIAGNOSIS — M25571 Pain in right ankle and joints of right foot: Secondary | ICD-10-CM | POA: Diagnosis not present

## 2022-08-28 DIAGNOSIS — Z882 Allergy status to sulfonamides status: Secondary | ICD-10-CM | POA: Diagnosis not present

## 2022-08-28 DIAGNOSIS — M7731 Calcaneal spur, right foot: Secondary | ICD-10-CM | POA: Diagnosis not present

## 2022-08-28 DIAGNOSIS — R296 Repeated falls: Secondary | ICD-10-CM | POA: Diagnosis not present

## 2022-08-28 DIAGNOSIS — S93401A Sprain of unspecified ligament of right ankle, initial encounter: Secondary | ICD-10-CM | POA: Diagnosis not present

## 2022-08-28 DIAGNOSIS — M25561 Pain in right knee: Secondary | ICD-10-CM | POA: Diagnosis not present

## 2022-08-28 DIAGNOSIS — S8391XA Sprain of unspecified site of right knee, initial encounter: Secondary | ICD-10-CM | POA: Diagnosis not present

## 2022-08-28 DIAGNOSIS — W010XXA Fall on same level from slipping, tripping and stumbling without subsequent striking against object, initial encounter: Secondary | ICD-10-CM | POA: Diagnosis not present

## 2022-08-29 ENCOUNTER — Encounter: Payer: Self-pay | Admitting: Sports Medicine

## 2022-08-31 ENCOUNTER — Encounter: Payer: Self-pay | Admitting: Physician Assistant

## 2022-09-01 ENCOUNTER — Ambulatory Visit (INDEPENDENT_AMBULATORY_CARE_PROVIDER_SITE_OTHER): Payer: Federal, State, Local not specified - PPO | Admitting: Sports Medicine

## 2022-09-01 ENCOUNTER — Ambulatory Visit (INDEPENDENT_AMBULATORY_CARE_PROVIDER_SITE_OTHER): Payer: Federal, State, Local not specified - PPO

## 2022-09-01 DIAGNOSIS — S83411A Sprain of medial collateral ligament of right knee, initial encounter: Secondary | ICD-10-CM | POA: Diagnosis not present

## 2022-09-01 DIAGNOSIS — R109 Unspecified abdominal pain: Secondary | ICD-10-CM

## 2022-09-01 DIAGNOSIS — N200 Calculus of kidney: Secondary | ICD-10-CM | POA: Diagnosis not present

## 2022-09-01 MED ORDER — KETOROLAC TROMETHAMINE 30 MG/ML IJ SOLN
30.0000 mg | Freq: Once | INTRAMUSCULAR | Status: AC
  Administered 2022-09-01: 30 mg via INTRAMUSCULAR

## 2022-09-01 MED ORDER — HYDROCODONE-ACETAMINOPHEN 5-325 MG PO TABS: 1.0000 | ORAL_TABLET | Freq: Three times a day (TID) | ORAL | 0 refills | Status: DC | PRN

## 2022-09-01 NOTE — Assessment & Plan Note (Signed)
Had a fall several days ago, tripped over the leash, knee was forced into valgus, she felt a pop and had immediate pain and swelling, seen in the ED, x-rays negative, unfortunately she has had persistent pain, she has an effusion, she has significant laxity of the medial collateral ligament and the anterior cruciate ligament with a positive Lachman sign. Adding a hinged knee brace, hydrocodone, 30 of Toradol intramuscular and hydrocodone for breakthrough pain.

## 2022-09-01 NOTE — Addendum Note (Signed)
Addended by: Dema Severin on: 09/01/2022 03:43 PM   Modules accepted: Orders

## 2022-09-01 NOTE — Progress Notes (Signed)
    Procedures performed today:    None.  Independent interpretation of notes and tests performed by another provider:   None.  Brief History, Exam, Impression, and Recommendations:    Tear of medial collateral ligament of right knee Had a fall several days ago, tripped over the leash, knee was forced into valgus, she felt a pop and had immediate pain and swelling, seen in the ED, x-rays negative, unfortunately she has had persistent pain, she has an effusion, she has significant laxity of the medial collateral ligament and the anterior cruciate ligament with a positive Lachman sign. Adding a hinged knee brace, hydrocodone, 30 of Toradol intramuscular and hydrocodone for breakthrough pain.  Acute abdominal pain Rivky also has acute abdominal pain, left upper quadrant since the fall, she does have guarding. I do think stat CT abdomen and pelvis without contrast with blunt force trauma for concerns of abdominal organ injury.    ____________________________________________ Gwen Her. Dianah Field, M.D., ABFM., CAQSM., AME. Primary Care and Sports Medicine [facility]  Adjunct Professor of [employer]  Risk manager

## 2022-09-01 NOTE — Assessment & Plan Note (Signed)
Angela Oliver also has acute abdominal pain, left upper quadrant since the fall, she does have guarding. I do think stat CT abdomen and pelvis without contrast with blunt force trauma for concerns of abdominal organ injury.

## 2022-09-07 ENCOUNTER — Ambulatory Visit (INDEPENDENT_AMBULATORY_CARE_PROVIDER_SITE_OTHER): Payer: Federal, State, Local not specified - PPO

## 2022-09-07 DIAGNOSIS — S83411A Sprain of medial collateral ligament of right knee, initial encounter: Secondary | ICD-10-CM | POA: Diagnosis not present

## 2022-09-07 DIAGNOSIS — S8991XA Unspecified injury of right lower leg, initial encounter: Secondary | ICD-10-CM | POA: Diagnosis not present

## 2022-09-08 ENCOUNTER — Encounter: Payer: Self-pay | Admitting: Sports Medicine

## 2022-09-16 ENCOUNTER — Other Ambulatory Visit: Payer: Self-pay | Admitting: Physician Assistant

## 2022-09-16 DIAGNOSIS — M47816 Spondylosis without myelopathy or radiculopathy, lumbar region: Secondary | ICD-10-CM

## 2022-09-16 DIAGNOSIS — M5136 Other intervertebral disc degeneration, lumbar region: Secondary | ICD-10-CM

## 2022-09-16 DIAGNOSIS — G43709 Chronic migraine without aura, not intractable, without status migrainosus: Secondary | ICD-10-CM

## 2022-10-01 ENCOUNTER — Ambulatory Visit: Payer: Federal, State, Local not specified - PPO | Admitting: Physician Assistant

## 2022-10-01 VITALS — BP 152/75 | HR 72 | Ht 64.0 in | Wt 207.0 lb

## 2022-10-01 DIAGNOSIS — R030 Elevated blood-pressure reading, without diagnosis of hypertension: Secondary | ICD-10-CM

## 2022-10-01 DIAGNOSIS — Z79899 Other long term (current) drug therapy: Secondary | ICD-10-CM | POA: Diagnosis not present

## 2022-10-01 DIAGNOSIS — Z1159 Encounter for screening for other viral diseases: Secondary | ICD-10-CM

## 2022-10-01 DIAGNOSIS — M5136 Other intervertebral disc degeneration, lumbar region: Secondary | ICD-10-CM

## 2022-10-01 DIAGNOSIS — S83411D Sprain of medial collateral ligament of right knee, subsequent encounter: Secondary | ICD-10-CM | POA: Diagnosis not present

## 2022-10-01 DIAGNOSIS — Z13220 Encounter for screening for lipoid disorders: Secondary | ICD-10-CM | POA: Diagnosis not present

## 2022-10-01 DIAGNOSIS — G43709 Chronic migraine without aura, not intractable, without status migrainosus: Secondary | ICD-10-CM

## 2022-10-01 DIAGNOSIS — Z23 Encounter for immunization: Secondary | ICD-10-CM

## 2022-10-01 DIAGNOSIS — S83411A Sprain of medial collateral ligament of right knee, initial encounter: Secondary | ICD-10-CM

## 2022-10-01 MED ORDER — HYDROCODONE-ACETAMINOPHEN 5-325 MG PO TABS: 1.0000 | ORAL_TABLET | Freq: Three times a day (TID) | ORAL | 0 refills | Status: DC | PRN

## 2022-10-01 NOTE — Progress Notes (Signed)
Established Patient Office Visit  Subjective   Patient ID: Angela Oliver, female    DOB: [birth year]  Age: 56 y.o. MRN: [id number]  Chief Complaint  Patient presents with   Follow-up    HPI Pt is a 56 yo obese female with recent right medial collateral ligament tear, Migraines, chronic DDD pain who presents to the clinic for refills and follow up.   DR. T ordered MRI and managing MCL tear of right knee. Pt is in immobilizer. She continues to be in quite a bit of pain and ask for refill of norco. She is taking ibuprofen 800mg  TID.   Migraines are fairly well controlled on Qulipta.    .. 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   Trochanteric bursitis of both hips 11/03/2012   Lumbar spondylosis 02/19/2014   C7 radiculopathy 12/01/2015   Obesity (BMI 30.0-34.9) 01/27/2017   Pain of right breast 07/07/2017   Family history of breast cancer in mother 07/07/2017   Carpal tunnel syndrome on both sides 11/29/2017   Dysfunction of left eustachian tube 02/01/2018   Stress reaction 01/01/2019   DDD (degenerative disc disease), lumbar 01/01/2019   Seasonal allergies 03/18/2019   Cyst of left kidney 09/10/2019   Right kidney stone 09/10/2019   Epigastric pain 11/26/2019   OAB (overactive bladder) 05/28/2020   Abdominal distension (gaseous) 05/28/2020   Abnormal brain MRI 09/30/2020   Chronic migraine without aura without status migrainosus, not intractable 01/21/2021   Migraine without aura and without status migrainosus, not intractable 01/26/2021   Insertional Achilles tendinitis, right leg, retrocalcaneal bursitis 05/21/2022   Tear of medial collateral ligament of right knee 09/01/2022   Acute abdominal pain 09/01/2022   Resolved Ambulatory Problems    Diagnosis Date Noted   RUQ PAIN  06/09/2011   Migraines 10/23/2012   Neck muscle spasm 11/03/2012   Peroneal tendinitis of left lower extremity 12/29/2012   Trochanteric bursitis of right hip 11/22/2013   Left foot pain 11/22/2013   Foot pain, left 02/19/2014   Poison ivy dermatitis 07/04/2014   Intractable chronic migraine without aura and with status migrainosus 10/02/2014   Acute bronchitis 09/16/2015   Paresthesia of both hands 12/01/2015   Calculus of gallbladder without cholecystitis without obstruction 05/03/2016   Symptomatic cholelithiasis 06/01/2016   S/P cholecystectomy 07/16/2016   Low back sprain 07/28/2016   Abnormal weight gain 07/07/2017   Left upper quadrant pain 05/28/2020   Greater trochanteric bursitis of both hips 04/17/2021   Past Medical History:  Diagnosis Date   Anxiety    Arthritis    Asthma    Complication of anesthesia    Family history of adverse reaction to anesthesia    GERD (gastroesophageal reflux disease)    Irregular heart beat     ROS See HPI.    Objective:     BP (!) 152/75   Pulse 72   Ht 5\' 4"  (1.626 m)   Wt 207 lb (93.9 kg)   LMP 11/12/2016 (Approximate)   SpO2 98%   BMI 35.53 kg/m  BP Readings from Last 3 Encounters:  10/01/22 (!) 152/75  07/23/22 131/82  06/25/22 (!) 141/78   Wt Readings from Last 3 Encounters:  10/01/22 207 lb (93.9 kg)  07/23/22 204 lb 3.2 oz (92.6 kg)  06/25/22 206 lb (93.4 kg)  Physical Exam HENT:     Head: Normocephalic and atraumatic.  Cardiovascular:     Rate and Rhythm: Normal rate and regular rhythm.  Pulmonary:     Effort: Pulmonary effort is normal.     Breath sounds: Normal breath sounds.  Musculoskeletal:     Comments: Right leg in immobilizer brace  Neurological:     General: No focal deficit present.     Mental Status: She is oriented to person, place, and time.  Psychiatric:        Mood and Affect: Mood normal.          Assessment & Plan:  Marland KitchenMarland KitchenZakyia was seen today for follow-up.  Diagnoses  and all orders for this visit:  Tear of medial collateral ligament of right knee, subsequent encounter -     HYDROcodone-acetaminophen (NORCO/VICODIN) 5-325 MG tablet; Take 1 tablet by mouth every 8 (eight) hours as needed for moderate pain.  Flu vaccine need -     Flu Vaccine QUAD 52mo+IM (Fluarix, Fluzone & Alfiuria Quad PF)  Lipid screening -     Lipid Panel w/reflex Direct LDL  Medication management -     COMPLETE METABOLIC PANEL WITH GFR  Elevated blood-pressure reading without diagnosis of hypertension  Encounter for hepatitis C screening test for low risk patient -     Hepatitis C antibody   Elevated BP today but patient is in some pain Will continue to monitor Follow up in 4 weeks  Needs labs for medication management Ordered today  Flu shot given without complication  Discussed MRI results and tear Follow up with Dr. Darene Lamer ..PDMP reviewed during this encounter. Refilled norco for pain control to use with ibuprofen 800mg  Continue in [employer], PA-C

## 2022-10-08 ENCOUNTER — Encounter: Payer: Self-pay | Admitting: Physician Assistant

## 2022-10-08 MED ORDER — DULOXETINE HCL 60 MG PO CPEP: ORAL_CAPSULE | ORAL | 1 refills | Status: DC

## 2022-10-08 MED ORDER — RIZATRIPTAN BENZOATE 10 MG PO TBDP: 10.0000 mg | ORAL_TABLET | ORAL | 5 refills | Status: AC | PRN

## 2022-10-14 DIAGNOSIS — D320 Benign neoplasm of cerebral meninges: Secondary | ICD-10-CM | POA: Diagnosis not present

## 2022-10-14 DIAGNOSIS — G939 Disorder of brain, unspecified: Secondary | ICD-10-CM | POA: Diagnosis not present

## 2022-10-14 DIAGNOSIS — G9389 Other specified disorders of brain: Secondary | ICD-10-CM | POA: Diagnosis not present

## 2022-10-28 DIAGNOSIS — Z131 Encounter for screening for diabetes mellitus: Secondary | ICD-10-CM | POA: Diagnosis not present

## 2022-10-28 DIAGNOSIS — Z1159 Encounter for screening for other viral diseases: Secondary | ICD-10-CM | POA: Diagnosis not present

## 2022-10-28 DIAGNOSIS — Z13220 Encounter for screening for lipoid disorders: Secondary | ICD-10-CM | POA: Diagnosis not present

## 2022-10-29 ENCOUNTER — Ambulatory Visit (INDEPENDENT_AMBULATORY_CARE_PROVIDER_SITE_OTHER): Payer: Federal, State, Local not specified - PPO | Admitting: Physician Assistant

## 2022-10-29 ENCOUNTER — Encounter: Payer: Self-pay | Admitting: Physician Assistant

## 2022-10-29 VITALS — BP 147/64 | HR 82 | Ht 64.0 in

## 2022-10-29 DIAGNOSIS — I10 Essential (primary) hypertension: Secondary | ICD-10-CM | POA: Diagnosis not present

## 2022-10-29 DIAGNOSIS — R030 Elevated blood-pressure reading, without diagnosis of hypertension: Secondary | ICD-10-CM | POA: Diagnosis not present

## 2022-10-29 MED ORDER — LISINOPRIL 2.5 MG PO TABS: 2.5000 mg | ORAL_TABLET | Freq: Every day | ORAL | 1 refills | Status: DC

## 2022-10-29 NOTE — Patient Instructions (Signed)
Start Lisinopril 2.5mg  once daily. Return in  2 weeks for a f/u visit with Tandy Gaw , PA- pt schld for  11/12/22 @ 3:20pm=kph

## 2022-10-29 NOTE — Progress Notes (Signed)
BP continues to be elevated. Start lisinopril 2.5mg  daily. Sent to pharmacy. Follow up in 4 weeks with myself.

## 2022-10-29 NOTE — Progress Notes (Signed)
   Established Patient Office Visit  Subjective   Patient ID: Angela Oliver, female    DOB: [birth year]  Age: 56 y.o. MRN: [id number]  Chief Complaint  Patient presents with   Blood Pressure Check    Nurse visit    HPI  BP check -  pt denies  chest pain, shortness of breath, palpitations. Patient does state that she is still in a lot of pain and had falled on 10/23/22 with bruising to upper left arm and left flank. She states back pain was not worsened by the fall.   ROS    Objective:     BP (!) 147/64   Pulse 82   Ht 5\' 4"  (1.626 m)   LMP 11/12/2016 (Approximate)   SpO2 95%   BMI 35.53 kg/m    Physical Exam   No results found for any visits on 10/29/22.    The 10-year ASCVD risk score (Arnett DK, et al., 2019) is: 4.7%    Assessment & Plan:  BP check -nurse visit- initial reading 147/64 and second reading 147/64.  Per 2020, PA -will start patient on low dose Lisinopril 2.5mg  once daily and patient should return for a follow up visit with her in 2 weeks ( patient schld for 11/12/22 @ 3:20pm)  Problem List Items Addressed This Visit   None   No follow-ups on file.    14/1/23, LPN

## 2022-11-01 ENCOUNTER — Encounter: Payer: Self-pay | Admitting: Physician Assistant

## 2022-11-01 DIAGNOSIS — E781 Pure hyperglyceridemia: Secondary | ICD-10-CM | POA: Insufficient documentation

## 2022-11-01 DIAGNOSIS — E785 Hyperlipidemia, unspecified: Secondary | ICD-10-CM | POA: Insufficient documentation

## 2022-11-01 LAB — LIPID PANEL W/REFLEX DIRECT LDL
Cholesterol: 244 mg/dL — ABNORMAL HIGH (ref <200)
HDL: 47 mg/dL — ABNORMAL LOW (ref >=50)
LDL Cholesterol (Calc): 151 mg/dL (calc) — ABNORMAL HIGH
Non-HDL Cholesterol (Calc): 197 mg/dL (calc) — ABNORMAL HIGH (ref <130)
Total CHOL/HDL Ratio: 5.2 (calc) — ABNORMAL HIGH (ref <5.0)
Triglycerides: 299 mg/dL — ABNORMAL HIGH (ref <150)

## 2022-11-01 LAB — COMPLETE METABOLIC PANEL WITH GFR
AG Ratio: 1.8 (calc) (ref 1.0–2.5)
ALT: 26 U/L (ref 6–29)
AST: 18 U/L (ref 10–35)
Albumin: 4.4 g/dL (ref 3.6–5.1)
Alkaline phosphatase (APISO): 67 U/L (ref 37–153)
BUN: 18 mg/dL (ref 7–25)
CO2: 27 mmol/L (ref 20–32)
Calcium: 10 mg/dL (ref 8.6–10.4)
Chloride: 105 mmol/L (ref 98–110)
Creat: 0.79 mg/dL (ref 0.50–1.03)
Globulin: 2.5 g/dL (calc) (ref 1.9–3.7)
Glucose, Bld: 107 mg/dL — ABNORMAL HIGH (ref 65–99)
Potassium: 4.2 mmol/L (ref 3.5–5.3)
Sodium: 140 mmol/L (ref 135–146)
Total Bilirubin: 0.8 mg/dL (ref 0.2–1.2)
Total Protein: 6.9 g/dL (ref 6.1–8.1)
eGFR: 87 mL/min/{1.73_m2} (ref >=60)

## 2022-11-01 LAB — HEPATITIS C ANTIBODY: Hepatitis C Ab: NONREACTIVE

## 2022-11-01 LAB — HEMOGLOBIN A1C W/OUT EAG

## 2022-11-01 NOTE — Progress Notes (Signed)
Angela Oliver,   Elevated fasting glucose. Need to add A1C to evaluate for diabetes.  Kidney and liver look good.   Marland Kitchen.The 10-year ASCVD risk score (Arnett DK, et al., 2019) is: 6.4%   Values used to calculate the score:     Age: 56 years     Sex: Female     Is Non-Hispanic African American: No     Diabetic: No     Tobacco smoker: No     Systolic Blood Pressure: 147 mmHg     Is BP treated: Yes     HDL Cholesterol: 47 mg/dL     Total Cholesterol: 244 mg/dL  Overall risk is borderline for medication.  HDL too low.  LDL too high.  TG too high.   I would suggest starting a statin to lower LDL, increase HDL, lower TG and decrease CV risk. Thoughts?

## 2022-11-02 ENCOUNTER — Other Ambulatory Visit: Payer: Self-pay | Admitting: Neurology

## 2022-11-12 ENCOUNTER — Ambulatory Visit: Payer: Federal, State, Local not specified - PPO | Admitting: Physician Assistant

## 2022-11-23 ENCOUNTER — Other Ambulatory Visit: Payer: Self-pay | Admitting: Physician Assistant

## 2022-12-04 ENCOUNTER — Other Ambulatory Visit: Payer: Self-pay | Admitting: Physician Assistant

## 2022-12-09 ENCOUNTER — Encounter: Payer: Self-pay | Admitting: Physician Assistant

## 2022-12-10 ENCOUNTER — Telehealth: Payer: Federal, State, Local not specified - PPO | Admitting: Family Medicine

## 2022-12-10 DIAGNOSIS — J40 Bronchitis, not specified as acute or chronic: Secondary | ICD-10-CM

## 2022-12-10 MED ORDER — PREDNISONE 20 MG PO TABS: 40.0000 mg | ORAL_TABLET | Freq: Every day | ORAL | 0 refills | Status: AC

## 2022-12-10 MED ORDER — DOXYCYCLINE HYCLATE 100 MG PO TABS: 100.0000 mg | ORAL_TABLET | Freq: Two times a day (BID) | ORAL | 0 refills | Status: AC

## 2022-12-10 MED ORDER — PROMETHAZINE-DM 6.25-15 MG/5ML PO SYRP: 5.0000 mL | ORAL_SOLUTION | Freq: Four times a day (QID) | ORAL | 0 refills | Status: DC | PRN

## 2022-12-10 NOTE — Progress Notes (Signed)
Virtual Visit Consent   Angela Oliver, you are scheduled for a virtual visit with a [facility] provider today. Just as with appointments in the office, your consent must be obtained to participate. Your consent will be active for this visit and any virtual visit you may have with one of our providers in the next 365 days. If you have a MyChart account, a copy of this consent can be sent to you electronically.  As this is a virtual visit, video technology does not allow for your provider to perform a traditional examination. This may limit your provider's ability to fully assess your condition. If your provider identifies any concerns that need to be evaluated in person or the need to arrange testing (such as labs, EKG, etc.), we will make arrangements to do so. Although advances in technology are sophisticated, we cannot ensure that it will always work on either your end or our end. If the connection with a video visit is poor, the visit may have to be switched to a telephone visit. With either a video or telephone visit, we are not always able to ensure that we have a secure connection.  By engaging in this virtual visit, you consent to the provision of healthcare and authorize for your insurance to be billed (if applicable) for the services provided during this visit. Depending on your insurance coverage, you may receive a charge related to this service.  I need to obtain your verbal consent now. Are you willing to proceed with your visit today? Angela Oliver has provided verbal consent on 12/10/2022 for a virtual visit (video or telephone). Angela Finner, NP  Date: 12/10/2022 10:16 AM  Virtual Visit via Video Note   I, Angela Oliver, connected with  Angela Oliver  ([id number], 1966-07-01) on 12/10/22 at 10:15 AM EST by a video-enabled telemedicine application and verified that I am speaking with the correct person using two identifiers.  Location: Patient: Virtual Visit Location Patient:  Home Provider: Virtual Visit Location Provider: Home Office   I discussed the limitations of evaluation and management by telemedicine and the availability of in person appointments. The patient expressed understanding and agreed to proceed.    History of Present Illness: Angela Oliver is a 56 y.o. who identifies as a female who was assigned female at birth, and is being seen today for persistent cough for 3 weeks. Reports it is in windpipe/ coughing is causing her to gag. Reports cough is dry and non productive. History of asthma.- using her medications as directed. Denies fevers, chills, chest pain, shortness of breath, sore throat or eat pain.  Problems:  Patient Active Problem List   Diagnosis Date Noted   Dyslipidemia (high LDL; low HDL) 11/01/2022   Hypertriglyceridemia 11/01/2022   Elevated blood-pressure reading without diagnosis of hypertension 10/29/2022   Primary hypertension 10/29/2022   Tear of medial collateral ligament of right knee 09/01/2022   Acute abdominal pain 09/01/2022   Insertional Achilles tendinitis, right leg, retrocalcaneal bursitis 05/21/2022   Migraine without aura and without status migrainosus, not intractable 01/26/2021   Chronic migraine without aura without status migrainosus, not intractable 01/21/2021   Abnormal brain MRI 09/30/2020   OAB (overactive bladder) 05/28/2020   Abdominal distension (gaseous) 05/28/2020   Epigastric pain 11/26/2019   Cyst of left kidney 09/10/2019   Right kidney stone 09/10/2019   Seasonal allergies 03/18/2019   Stress reaction 01/01/2019   DDD (degenerative disc disease), lumbar 01/01/2019   Dysfunction of left eustachian tube 02/01/2018  Carpal tunnel syndrome on both sides 11/29/2017   Pain of right breast 07/07/2017   Family history of breast cancer in mother 07/07/2017   Obesity (BMI 30.0-34.9) 01/27/2017   C7 radiculopathy 12/01/2015   Lumbar spondylosis 02/19/2014   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/2012    Trochanteric bursitis of both hips 11/03/2012   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   Herpes simplex 10/23/2012   Allergic rhinitis 06/09/2011   Asthma 06/09/2011   GERD 06/09/2011    Allergies:  Allergies  Allergen Reactions   Sulfa Antibiotics Hives, Shortness Of Breath, Swelling and Rash    Throat swelling   Topamax [Topiramate] Other (See Comments)    Severe stomach pain   Sumatriptan Swelling   Medications:  Current Outpatient Medications:    albuterol (VENTOLIN HFA) 108 (90 Base) MCG/ACT inhaler, Inhale 2 puffs into the lungs every 6 (six) hours as needed for wheezing., Disp: 1 each, Rfl: 0   Albuterol Sulfate (PROAIR RESPICLICK) 123XX123 (90 Base) MCG/ACT AEPB, Inhale 2 puffs into the lungs every 4 (four) hours as needed (shortness of breath/ wheezing). (Patient taking differently: Inhale 2 puffs into the lungs in the morning and at bedtime.), Disp: 1 each, Rfl: 1   Atogepant (QULIPTA) 30 MG TABS, Take one tablet daily for migraine., Disp: 90 tablet, Rfl: 1   beclomethasone (QVAR) 80 MCG/ACT inhaler, Inhale 2 puffs into the lungs 2 (two) times daily., Disp: 3 Inhaler, Rfl: 3   cetirizine (ZYRTEC) 10 MG tablet, Take 1 tablet (10 mg total) by mouth daily., Disp: 90 tablet, Rfl: 3   DULoxetine (CYMBALTA) 60 MG capsule, TAKE 1 CAPSULE BY MOUTH EVERY DAY, Disp: 90 capsule, Rfl: 1   HYDROcodone-acetaminophen (NORCO/VICODIN) 5-325 MG tablet, Take 1 tablet by mouth every 8 (eight) hours as needed for moderate pain., Disp: 15 tablet, Rfl: 0   lisinopril (ZESTRIL) 2.5 MG tablet, Take 1 tablet (2.5 mg total) by mouth daily., Disp: 30 tablet, Rfl: 1   montelukast (SINGULAIR) 10 MG tablet, TAKE 1 TABLET BY MOUTH EVERY DAY, Disp: 90 tablet, Rfl: 3   omeprazole (PRILOSEC) 40 MG capsule, TAKE 1 CAPSULE (40 MG TOTAL) BY MOUTH DAILY., Disp: 90 capsule, Rfl: 3   ondansetron (ZOFRAN-ODT) 8 MG disintegrating tablet, Take 1 tablet (8 mg total) by mouth every 8 (eight) hours as needed for nausea., Disp: 20  tablet, Rfl: 3   Pyridoxine HCl (VITAMIN B-6 PO), Take 1 tablet by mouth daily., Disp: , Rfl:    rizatriptan (MAXALT-MLT) 10 MG disintegrating tablet, Take 1 tablet (10 mg total) by mouth as needed for migraine. May repeat in 2 hours if needed, Disp: 10 tablet, Rfl: 5  Observations/Objective: Patient is well-developed, well-nourished in no acute distress.  Resting comfortably  at home.  Head is normocephalic, atraumatic.  No labored breathing.  Speech is clear and coherent with logical content.  Patient is alert and oriented at baseline.  Coughing -dry   Assessment and Plan:  1. Bronchitis  - predniSONE (DELTASONE) 20 MG tablet; Take 2 tablets (40 mg total) by mouth daily with breakfast for 5 days.  Dispense: 10 tablet; Refill: 0 - promethazine-dextromethorphan (PROMETHAZINE-DM) 6.25-15 MG/5ML syrup; Take 5 mLs by mouth 4 (four) times daily as needed for cough.  Dispense: 118 mL; Refill: 0 - doxycycline (VIBRA-TABS) 100 MG tablet; Take 1 tablet (100 mg total) by mouth 2 (two) times daily for 10 days.  Dispense: 20 tablet; Refill: 0  - Take meds as prescribed - Rest voice - Use a cool mist humidifier especially during the  winter months when heat dries out the air. - Use saline nose sprays frequently to help soothe nasal passages if they are drying out. - Stay hydrated by drinking plenty of fluids - Keep thermostat turn down low to prevent drying out which can cause a dry cough. - For any cough or congestion- robitussin DM or Delsym as needed - For fever or aches or pains- take tylenol or ibuprofen as directed on bottle             * for fevers greater than 101 orally you may alternate ibuprofen and tylenol every 3 hours.  If you do not improve you will need a follow up visit in person.               Reviewed side effects, risks and benefits of medication.    Patient acknowledged agreement and understanding of the plan.   Past Medical, Surgical, Social History, Allergies, and  Medications have been Reviewed.     Follow Up Instructions: I discussed the assessment and treatment plan with the patient. The patient was provided an opportunity to ask questions and all were answered. The patient agreed with the plan and demonstrated an understanding of the instructions.  A copy of instructions were sent to the patient via MyChart unless otherwise noted below.    The patient was advised to call back or seek an in-person evaluation if the symptoms worsen or if the condition fails to improve as anticipated.  Time:  I spent 8 minutes with the patient via telehealth technology discussing the above problems/concerns.    Perlie Mayo, NP

## 2022-12-10 NOTE — Patient Instructions (Signed)
Angela Oliver, thank you for joining Angela Oliver, Angela Oliver for today's virtual visit.  While this provider is not your primary care provider (PCP), if your PCP is located in our provider database this encounter information will be shared with them immediately following your visit.   A [facility] MyChart account gives you access to today's visit and all your visits, tests, and labs performed at [facility] " click here if you don't have a [facility] MyChart account or go to [email or web address]  Consent: (Patient) Angela Oliver provided verbal consent for this virtual visit at the beginning of the encounter.  Current Medications:  Current Outpatient Medications:    [START ON 12/12/2022] doxycycline (VIBRA-TABS) 100 MG tablet, Take 1 tablet (100 mg total) by mouth 2 (two) times daily for 10 days., Disp: 20 tablet, Rfl: 0   predniSONE (DELTASONE) 20 MG tablet, Take 2 tablets (40 mg total) by mouth daily with breakfast for 5 days., Disp: 10 tablet, Rfl: 0   promethazine-dextromethorphan (PROMETHAZINE-DM) 6.25-15 MG/5ML syrup, Take 5 mLs by mouth 4 (four) times daily as needed for cough., Disp: 118 mL, Rfl: 0   albuterol (VENTOLIN HFA) 108 (90 Base) MCG/ACT inhaler, Inhale 2 puffs into the lungs every 6 (six) hours as needed for wheezing., Disp: 1 each, Rfl: 0   Albuterol Sulfate (PROAIR RESPICLICK) 108 (90 Base) MCG/ACT AEPB, Inhale 2 puffs into the lungs every 4 (four) hours as needed (shortness of breath/ wheezing). (Patient taking differently: Inhale 2 puffs into the lungs in the morning and at bedtime.), Disp: 1 each, Rfl: 1   Atogepant (QULIPTA) 30 MG TABS, Take one tablet daily for migraine., Disp: 90 tablet, Rfl: 1   beclomethasone (QVAR) 80 MCG/ACT inhaler, Inhale 2 puffs into the lungs 2 (two) times daily., Disp: 3 Inhaler, Rfl: 3   cetirizine (ZYRTEC) 10 MG tablet, Take 1 tablet (10 mg total) by mouth daily., Disp: 90 tablet, Rfl: 3   DULoxetine (CYMBALTA) 60 MG capsule, TAKE  1 CAPSULE BY MOUTH EVERY DAY, Disp: 90 capsule, Rfl: 1   HYDROcodone-acetaminophen (NORCO/VICODIN) 5-325 MG tablet, Take 1 tablet by mouth every 8 (eight) hours as needed for moderate pain., Disp: 15 tablet, Rfl: 0   lisinopril (ZESTRIL) 2.5 MG tablet, Take 1 tablet (2.5 mg total) by mouth daily., Disp: 30 tablet, Rfl: 1   montelukast (SINGULAIR) 10 MG tablet, TAKE 1 TABLET BY MOUTH EVERY DAY, Disp: 90 tablet, Rfl: 3   omeprazole (PRILOSEC) 40 MG capsule, TAKE 1 CAPSULE (40 MG TOTAL) BY MOUTH DAILY., Disp: 90 capsule, Rfl: 3   ondansetron (ZOFRAN-ODT) 8 MG disintegrating tablet, Take 1 tablet (8 mg total) by mouth every 8 (eight) hours as needed for nausea., Disp: 20 tablet, Rfl: 3   Pyridoxine HCl (VITAMIN B-6 PO), Take 1 tablet by mouth daily., Disp: , Rfl:    rizatriptan (MAXALT-MLT) 10 MG disintegrating tablet, Take 1 tablet (10 mg total) by mouth as needed for migraine. May repeat in 2 hours if needed, Disp: 10 tablet, Rfl: 5   Medications ordered in this encounter:  Meds ordered this encounter  Medications   predniSONE (DELTASONE) 20 MG tablet    Sig: Take 2 tablets (40 mg total) by mouth daily with breakfast for 5 days.    Dispense:  10 tablet    Refill:  0    Order Specific Question:   Supervising Provider    Answer:   Merrilee Jansky [id number]   promethazine-dextromethorphan (PROMETHAZINE-DM) 6.25-15 MG/5ML syrup    Sig: Take 5  mLs by mouth 4 (four) times daily as needed for cough.    Dispense:  118 mL    Refill:  0    Order Specific Question:   Supervising Provider    Answer:   Merrilee Jansky [id number]   doxycycline (VIBRA-TABS) 100 MG tablet    Sig: Take 1 tablet (100 mg total) by mouth 2 (two) times daily for 10 days.    Dispense:  20 tablet    Refill:  0    Order Specific Question:   Supervising Provider    Answer:   Merrilee Jansky [id number]     *If you need refills on other medications prior to your next appointment, please contact your  pharmacy*  Follow-Up: Call back or seek an in-person evaluation if the symptoms worsen or if the condition fails to improve as anticipated.  [facility] [phone]  Other Instructions  - Take meds as prescribed - Rest voice - Use a cool mist humidifier especially during the winter months when heat dries out the air. - Use saline nose sprays frequently to help soothe nasal passages if they are drying out. - Stay hydrated by drinking plenty of fluids - Keep thermostat turn down low to prevent drying out which can cause a dry cough. - For any cough or congestion- robitussin DM or Delsym as needed - For fever or aches or pains- take tylenol or ibuprofen as directed on bottle             * for fevers greater than 101 orally you may alternate ibuprofen and tylenol every 3 hours.  If you do not improve you will need a follow up visit in person.                  If you have been instructed to have an in-person evaluation today at a local Urgent Care facility, please use the link below. It will take you to a list of all of our available [facility], including address, phone number and hours of operation. Please do not delay care.  [facility]  If you or a family member do not have a primary care provider, use the link below to schedule a visit and establish care. When you choose a [facility] primary care physician or advanced practice provider, you gain a long-term partner in health. Find a Primary Care Provider  Learn more about [facility]'s in-office and virtual care options: [facility] - Get Care Now

## 2022-12-14 DIAGNOSIS — E119 Type 2 diabetes mellitus without complications: Secondary | ICD-10-CM | POA: Diagnosis not present

## 2022-12-28 ENCOUNTER — Encounter: Payer: Self-pay | Admitting: Physician Assistant

## 2023-01-05 ENCOUNTER — Other Ambulatory Visit: Payer: Self-pay | Admitting: Physician Assistant

## 2023-01-05 ENCOUNTER — Encounter: Payer: Self-pay | Admitting: Physician Assistant

## 2023-01-05 ENCOUNTER — Ambulatory Visit: Payer: Federal, State, Local not specified - PPO | Admitting: Physician Assistant

## 2023-01-05 VITALS — BP 123/65 | HR 102 | Ht 64.0 in | Wt 209.1 lb

## 2023-01-05 DIAGNOSIS — R7303 Prediabetes: Secondary | ICD-10-CM

## 2023-01-05 DIAGNOSIS — G43709 Chronic migraine without aura, not intractable, without status migrainosus: Secondary | ICD-10-CM

## 2023-01-05 DIAGNOSIS — M940 Chondrocostal junction syndrome [Tietze]: Secondary | ICD-10-CM

## 2023-01-05 DIAGNOSIS — M47816 Spondylosis without myelopathy or radiculopathy, lumbar region: Secondary | ICD-10-CM

## 2023-01-05 DIAGNOSIS — J4541 Moderate persistent asthma with (acute) exacerbation: Secondary | ICD-10-CM | POA: Diagnosis not present

## 2023-01-05 DIAGNOSIS — R053 Chronic cough: Secondary | ICD-10-CM

## 2023-01-05 DIAGNOSIS — M5136 Other intervertebral disc degeneration, lumbar region: Secondary | ICD-10-CM

## 2023-01-05 MED ORDER — PREDNISONE 20 MG PO TABS: ORAL_TABLET | ORAL | 0 refills | Status: DC

## 2023-01-05 NOTE — Progress Notes (Signed)
Acute Office Visit  Subjective:     Patient ID: Angela Oliver, female    DOB: 1966-11-07, 57 y.o.   MRN: [id number]  Chief Complaint  Patient presents with   Cough    Cough Associated symptoms include myalgias and shortness of breath (with exertion). Pertinent negatives include no chest pain, chills, ear pain, fever, headaches or sore throat.   Patient is in today for cough.  Starting the 2nd week of December 2023, she had nasal congestion and a dry cough. Since then, the congestion has gotten better but the cough has persisted. She states she has episodes of suddenly "feeling like a feather is in her throat" that will spark her to go into a coughing fit. These episodes have gotten worse and just a week ago she had one in which she coughed so hard she believed she pulled a muscle. She now is having left sided pain under her rib cage and on her side. She has tried ibuprofen and a TENS unit for this with no relief. Associated symptoms include SOB with exertion. The cough is worse at night and in the morning.   She states she did not start taking the daily steroid inhaler (Qvar) until her symptoms started. She has been inhaling 2 puffs twice daily. She states she has not needed to use her albuterol.   She denies fever, chills, chest pain, headache, nausea, vomiting, abdominal pain.   On review of chart, she had seen Cherly Beach, NP on 12/10/2022 for the same concerns. She was given a course of doxycycline, prednisone and promethazine. She states that she felt better with the prednisone but cough is now almost back to what it was. The promethazine works to suppress the cough but only for a short time.  She is interested in trying Ozempic. She has been prediabetic for a year and a half and has struggled with obesity for quite some time. She states that she maintains a fairly healthy diet and cuts out most sweets. She unfortunately has not been able to exercise as much as she would like to  because of back pain.   Review of Systems  Constitutional:  Negative for chills and fever.  HENT:  Positive for congestion. Negative for ear discharge, ear pain, sinus pain and sore throat.   Respiratory:  Positive for cough and shortness of breath (with exertion). Negative for sputum production.   Cardiovascular:  Negative for chest pain and palpitations.  Gastrointestinal:  Negative for constipation, nausea and vomiting.  Musculoskeletal:  Positive for back pain and myalgias.  Neurological:  Negative for dizziness, weakness and headaches.        Objective:    BP 123/65 (BP Location: Right Arm, Patient Position: Sitting, Cuff Size: Large)   Pulse (!) 102   Ht 5\' 4"  (1.626 m)   Wt 209 lb 1.3 oz (94.8 kg)   LMP 11/12/2016 (Approximate)   SpO2 97%   BMI 35.89 kg/m    Physical Exam Constitutional:      Appearance: She is obese.  HENT:     Head: Normocephalic and atraumatic.     Right Ear: Tympanic membrane normal.     Left Ear: Tympanic membrane normal.     Nose: Congestion and rhinorrhea present.     Mouth/Throat:     Pharynx: Oropharynx is clear. No oropharyngeal exudate.  Eyes:     Conjunctiva/sclera: Conjunctivae normal.  Cardiovascular:     Rate and Rhythm: Normal rate and regular rhythm.  Heart sounds: No murmur heard. Pulmonary:     Effort: Pulmonary effort is normal.     Breath sounds: Normal breath sounds. No wheezing or rhonchi.  Musculoskeletal:       Arms:  Skin:    General: Skin is warm.  Neurological:     Mental Status: She is alert and oriented to person, place, and time.     Results for orders placed or performed in visit on 01/05/23  Hemoglobin A1c  Result Value Ref Range   Hgb A1c MFr Bld 6.1 (H) <5.7 % of total Hgb   Mean Plasma Glucose 128 mg/dL   eAG (mmol/L) 7.1 mmol/L        Assessment & Plan:  Marland KitchenMarland KitchenLucilla was seen today for cough.  Diagnoses and all orders for this visit:  Persistent cough -     predniSONE (DELTASONE) 20 MG  tablet; Take 3 tablets for 3 days, take 2 tablets for 3 days, take 1 tablet for 3 days, take 1/2 tablet for 4 days.  Pre-diabetes -     Hemoglobin A1c -     Semaglutide,0.25 or 0.5MG /DOS, (OZEMPIC, 0.25 OR 0.5 MG/DOSE,) 2 MG/3ML SOPN; Inject 0.5 mg into the skin once a week.  Costochondritis  Moderate persistent asthma with acute exacerbation    Likely persistent bronchitis. No wheezing noted on exam. Recommend a longer prednisone taper to hopefully settle down the inflammation. She may continue to use Qvar and albuterol inhalers. Suggested that she may want to try using a humidifier at night. We discussed that bronchitis can take up to 6-8 weeks to resolve. If she continues to have the cough after another month, will consider switching from Qvar to an ICS-LABA inhaler.   Will recheck A1c. If it remains elevated, I think it would be reasonable to start a GLP-1 agonist since she has been unable to lose weight despite healthy diet.     Iran Planas, PA-C

## 2023-01-06 LAB — HEMOGLOBIN A1C
Hgb A1c MFr Bld: 6.1 % of total Hgb — ABNORMAL HIGH (ref <5.7)
Mean Plasma Glucose: 128 mg/dL
eAG (mmol/L): 7.1 mmol/L

## 2023-01-07 ENCOUNTER — Encounter: Payer: Self-pay | Admitting: Physician Assistant

## 2023-01-07 DIAGNOSIS — M940 Chondrocostal junction syndrome [Tietze]: Secondary | ICD-10-CM | POA: Insufficient documentation

## 2023-01-07 DIAGNOSIS — R7303 Prediabetes: Secondary | ICD-10-CM | POA: Insufficient documentation

## 2023-01-07 DIAGNOSIS — R053 Chronic cough: Secondary | ICD-10-CM | POA: Insufficient documentation

## 2023-01-07 MED ORDER — OZEMPIC (0.25 OR 0.5 MG/DOSE) 2 MG/3ML ~~LOC~~ SOPN: 0.5000 mg | PEN_INJECTOR | SUBCUTANEOUS | 0 refills | Status: DC

## 2023-01-07 NOTE — Progress Notes (Signed)
A1C continues to be in pre-diabetic range. Will submit to get ozempic approved.

## 2023-01-11 ENCOUNTER — Telehealth: Payer: Self-pay

## 2023-01-11 NOTE — Telephone Encounter (Signed)
Initiated Prior authorization QMG:NOIBBCW (0.25 or 0.5 MG/DOSE) 2MG /3ML pen-injectors Via: Covermymeds [id number]/VQX:[id number] Status: approved  as of 01/11/23 Reason:The authorization is valid from 12/12/2022 through 01/11/2024. A letter of explanation will also be mailed to the patient Notified Pt via: Mychart

## 2023-01-19 ENCOUNTER — Encounter: Payer: Self-pay | Admitting: Physician Assistant

## 2023-02-06 ENCOUNTER — Other Ambulatory Visit: Payer: Self-pay | Admitting: Physician Assistant

## 2023-02-06 DIAGNOSIS — R7303 Prediabetes: Secondary | ICD-10-CM

## 2023-02-14 DIAGNOSIS — R1012 Left upper quadrant pain: Secondary | ICD-10-CM | POA: Diagnosis not present

## 2023-02-27 ENCOUNTER — Other Ambulatory Visit: Payer: Self-pay | Admitting: Physician Assistant

## 2023-02-27 DIAGNOSIS — G43709 Chronic migraine without aura, not intractable, without status migrainosus: Secondary | ICD-10-CM

## 2023-02-28 DIAGNOSIS — K760 Fatty (change of) liver, not elsewhere classified: Secondary | ICD-10-CM | POA: Diagnosis not present

## 2023-02-28 DIAGNOSIS — R1012 Left upper quadrant pain: Secondary | ICD-10-CM | POA: Diagnosis not present

## 2023-02-28 DIAGNOSIS — N1330 Unspecified hydronephrosis: Secondary | ICD-10-CM | POA: Diagnosis not present

## 2023-02-28 DIAGNOSIS — Z9049 Acquired absence of other specified parts of digestive tract: Secondary | ICD-10-CM | POA: Diagnosis not present

## 2023-03-02 ENCOUNTER — Ambulatory Visit: Payer: Federal, State, Local not specified - PPO | Admitting: Physician Assistant

## 2023-03-02 ENCOUNTER — Encounter: Payer: Self-pay | Admitting: Physician Assistant

## 2023-03-02 VITALS — BP 140/77 | HR 82 | Ht 64.0 in | Wt 198.0 lb

## 2023-03-02 DIAGNOSIS — R7303 Prediabetes: Secondary | ICD-10-CM | POA: Diagnosis not present

## 2023-03-02 DIAGNOSIS — M7062 Trochanteric bursitis, left hip: Secondary | ICD-10-CM

## 2023-03-02 DIAGNOSIS — I10 Essential (primary) hypertension: Secondary | ICD-10-CM

## 2023-03-02 DIAGNOSIS — M7061 Trochanteric bursitis, right hip: Secondary | ICD-10-CM | POA: Diagnosis not present

## 2023-03-02 DIAGNOSIS — R944 Abnormal results of kidney function studies: Secondary | ICD-10-CM | POA: Diagnosis not present

## 2023-03-02 MED ORDER — DAPAGLIFLOZIN PRO-METFORMIN ER 10-1000 MG PO TB24: 1.0000 | ORAL_TABLET | Freq: Every day | ORAL | 0 refills | Status: DC

## 2023-03-02 NOTE — Patient Instructions (Signed)
Start xigduo daily for glucose control and kidney protection

## 2023-03-02 NOTE — Progress Notes (Signed)
Established Patient Office Visit  Subjective   Patient ID: Angela Oliver, female    DOB: 09-13-1966  Age: 57 y.o. MRN: [id number]  Chief Complaint  Patient presents with   Follow-up   Hip Pain    HPI Pt is a 57 yo female who presents to the clinic with bilateral bursitis pain in hips. She has to come in every 6 months or so for injections. They do seem to help a lot. She has not been able to move  and walk like she should recently.   She did not tolerate ozempic. It made her very nauseated all the time. She is pre-diabetic. Her BP has been elevated as well. No CP, palpitations, vision changes. She has known intermittent migraines that have been better controlled with qulipta.   .. 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   Trochanteric bursitis of both hips 11/03/2012   Lumbar spondylosis 02/19/2014   C7 radiculopathy 12/01/2015   Obesity (BMI 30.0-34.9) 01/27/2017   Pain of right breast 07/07/2017   Family history of breast cancer in mother 07/07/2017   Carpal tunnel syndrome on both sides 11/29/2017   Dysfunction of left eustachian tube 02/01/2018   Stress reaction 01/01/2019   DDD (degenerative disc disease), lumbar 01/01/2019   Seasonal allergies 03/18/2019   Cyst of left kidney 09/10/2019   Right kidney stone 09/10/2019   Epigastric pain 11/26/2019   OAB (overactive bladder) 05/28/2020   Abdominal distension (gaseous) 05/28/2020   Abnormal brain MRI 09/30/2020   Chronic migraine without aura without status migrainosus, not intractable 01/21/2021   Migraine without aura and without status migrainosus, not intractable 01/26/2021   Insertional Achilles tendinitis, right leg, retrocalcaneal bursitis 05/21/2022   Tear of medial collateral ligament of right knee 09/01/2022   Acute abdominal  pain 09/01/2022   Elevated blood-pressure reading without diagnosis of hypertension 10/29/2022   Primary hypertension 10/29/2022   Dyslipidemia (high LDL; low HDL) 11/01/2022   Hypertriglyceridemia 11/01/2022   Costochondritis 01/07/2023   Pre-diabetes 01/07/2023   Persistent cough 01/07/2023   Decreased GFR 03/02/2023   Resolved Ambulatory Problems    Diagnosis Date Noted   RUQ PAIN 06/09/2011   Migraines 10/23/2012   Neck muscle spasm 11/03/2012   Peroneal tendinitis of left lower extremity 12/29/2012   Trochanteric bursitis of right hip 11/22/2013   Left foot pain 11/22/2013   Foot pain, left 02/19/2014   Poison ivy dermatitis 07/04/2014   Intractable chronic migraine without aura and with status migrainosus 10/02/2014   Acute bronchitis 09/16/2015   Paresthesia of both hands 12/01/2015   Calculus of gallbladder without cholecystitis without obstruction 05/03/2016   Symptomatic cholelithiasis 06/01/2016   S/P cholecystectomy 07/16/2016   Low back sprain 07/28/2016   Abnormal weight gain 07/07/2017   Left upper quadrant pain 05/28/2020   Greater trochanteric bursitis of both hips 04/17/2021   Past Medical History:  Diagnosis Date   Anxiety    Arthritis    Asthma    Complication of anesthesia    Family history of adverse reaction to anesthesia    GERD (gastroesophageal reflux disease)    Irregular heart beat      ROS See HPI.    Objective:     BP (!) 140/77   Pulse 82   Ht 5\' 4"  (1.626 m)   Wt 198  lb (89.8 kg)   LMP 11/12/2016 (Approximate)   SpO2 98%   BMI 33.99 kg/m  BP Readings from Last 3 Encounters:  03/02/23 (!) 140/77  01/05/23 123/65  10/29/22 (!) 147/64   Wt Readings from Last 3 Encounters:  03/02/23 198 lb (89.8 kg)  01/05/23 209 lb 1.3 oz (94.8 kg)  10/01/22 207 lb (93.9 kg)   Aspiration/Injection Procedure Note Mamediarra Rufener [id number] [birth year]  Procedure: Injection Indications: pain  Procedure Details Consent: Risks of procedure  as well as the alternatives and risks of each were explained to the (patient/caregiver).  Consent for procedure obtained. Time Out: Verified patient identification, verified procedure, site/side was marked, verified correct patient position, special equipment/implants available, medications/allergies/relevent history reviewed, required imaging and test results available.  Performed    Bilateral lateral hips were cleaned with alcohol wipes x2.  Bilateral greater trochanters were injected with 45mL of lidocaine 2 percent no epi and 45mL of depo medrol 40mg .  A sterile dressing was applied.  Patient  tolerate procedure well. Estimated blood loss: none  Iran Planas    Physical Exam Constitutional:      Appearance: Normal appearance. She is obese.  HENT:     Head: Normocephalic.  Cardiovascular:     Rate and Rhythm: Normal rate and regular rhythm.  Pulmonary:     Effort: Pulmonary effort is normal.     Breath sounds: Normal breath sounds.  Musculoskeletal:     Right lower leg: No edema.     Left lower leg: No edema.     Comments: Tenderness to palpation over both greater trochanters  Neurological:     General: No focal deficit present.     Mental Status: She is alert and oriented to person, place, and time.  Psychiatric:        Mood and Affect: Mood normal.       The 10-year ASCVD risk score (Arnett DK, et al., 2019) is: 4.3%    Assessment & Plan:  Marland KitchenMarland KitchenAsijah was seen today for follow-up and hip pain.  Diagnoses and all orders for this visit:  Trochanteric bursitis of both hips  Pre-diabetes -     BASIC METABOLIC PANEL WITH GFR -     Dapagliflozin Pro-metFORMIN ER (XIGDUO XR) 09-999 MG TB24; Take 1 tablet by mouth daily with breakfast.  Primary hypertension -     BASIC METABOLIC PANEL WITH GFR -     Dapagliflozin Pro-metFORMIN ER (XIGDUO XR) 09-999 MG TB24; Take 1 tablet by mouth daily with breakfast.  Decreased GFR -     BASIC METABOLIC PANEL WITH GFR -      Dapagliflozin Pro-metFORMIN ER (XIGDUO XR) 09-999 MG TB24; Take 1 tablet by mouth daily with breakfast.   Bursitis discussed and injected both hips today HO given  BP elevated, pt is pre diabetic and GFR decreased Cmp ordered to check GFR Start xigduo Discussed side effects Could help with all 3   Follow up in 3 months   Iran Planas, PA-C

## 2023-03-03 LAB — BASIC METABOLIC PANEL WITH GFR
BUN: 14 mg/dL (ref 7–25)
CO2: 26 mmol/L (ref 20–32)
Calcium: 9.6 mg/dL (ref 8.6–10.4)
Chloride: 107 mmol/L (ref 98–110)
Creat: 0.95 mg/dL (ref 0.50–1.03)
Glucose, Bld: 112 mg/dL — ABNORMAL HIGH (ref 65–99)
Potassium: 3.7 mmol/L (ref 3.5–5.3)
Sodium: 147 mmol/L — ABNORMAL HIGH (ref 135–146)
eGFR: 69 mL/min/{1.73_m2} (ref >=60)

## 2023-03-04 ENCOUNTER — Telehealth: Payer: Self-pay

## 2023-03-04 ENCOUNTER — Other Ambulatory Visit: Payer: Self-pay | Admitting: Physician Assistant

## 2023-03-04 NOTE — Progress Notes (Signed)
Kidney function looks good and ok to start xigduo.

## 2023-03-04 NOTE — Telephone Encounter (Signed)
Initiated Prior authorization for: Dapagliflozin Pro-metFORMIN ER 10-1000MG  er tablets Via: Covermymeds Case/Key:[id number]  Status: approved  as of 03/04/23 Reason:The authorization is valid from 02/02/2023 through 03/03/2024 Notified Pt via: Mychart

## 2023-03-30 ENCOUNTER — Ambulatory Visit (INDEPENDENT_AMBULATORY_CARE_PROVIDER_SITE_OTHER): Payer: Federal, State, Local not specified - PPO | Admitting: Physician Assistant

## 2023-03-30 VITALS — BP 129/75 | HR 83

## 2023-03-30 DIAGNOSIS — I10 Essential (primary) hypertension: Secondary | ICD-10-CM | POA: Diagnosis not present

## 2023-03-30 NOTE — Patient Instructions (Signed)
continue current medication regimen ( stop taking  the Lisinopril 2.5mg  for elevated HR - if elevated HR conitues schedule a visit with her to evaluate this.) keep upcoming appt schld for June18th at 9:10am.

## 2023-03-30 NOTE — Progress Notes (Signed)
Vitals look great! Stay off any BP medication. Keep regular follow ups.

## 2023-03-30 NOTE — Progress Notes (Signed)
   Established Patient Office Visit  Subjective   Patient ID: Angela Oliver, female    DOB: 01/15/1966  Age: 57 y.o. MRN: [id number]  Chief Complaint  Patient presents with   Hypertension    BP check nurse visit.     HPI  Hypertension- BP Check nurse visit. Patient denies chest pain, shortness of breath, palpitations, dizzinesss. Patient not showing any BP Medication in current medication list. She states she was on Lisinopril 2.5mg  but this med was stopped ( she states she will occasionally take Lisinopril 2.5mg  when she feels her HR is elevated) . She thought the xigudo was a blood pressure medication but informed patient this was for treatment of diabetes and to preserve kidney function.   ROS    Objective:     BP 129/75   Pulse 83   LMP 11/12/2016 (Approximate)   SpO2 98%    Physical Exam   No results found for any visits on 03/30/23.    The 10-year ASCVD risk score (Arnett DK, et al., 2019) is: 3.7%    Assessment & Plan:  BP check nurse visit.  BP reading = 129/75.  Per Abran Richard- continue current medication regimen ( stop taking  the Lisinopril 2.5mg  for elevated HR - if elevated HR continues schedule a visit with her to evaluate this.) keep upcoming appt schld for June18th at 9:10am. Problem List Items Addressed This Visit       Cardiovascular and Mediastinum   Primary hypertension - Primary    Return for keep upcoming appt schld for June 18th at 9:10 am ..    Elizabeth Palau, LPN

## 2023-05-26 ENCOUNTER — Other Ambulatory Visit: Payer: Self-pay | Admitting: Physician Assistant

## 2023-05-26 DIAGNOSIS — J3089 Other allergic rhinitis: Secondary | ICD-10-CM

## 2023-05-26 DIAGNOSIS — J4530 Mild persistent asthma, uncomplicated: Secondary | ICD-10-CM

## 2023-05-31 ENCOUNTER — Ambulatory Visit: Payer: Federal, State, Local not specified - PPO | Admitting: Physician Assistant

## 2023-05-31 DIAGNOSIS — R1012 Left upper quadrant pain: Secondary | ICD-10-CM | POA: Diagnosis not present

## 2023-06-01 ENCOUNTER — Telehealth: Payer: Self-pay | Admitting: Physician Assistant

## 2023-06-01 ENCOUNTER — Ambulatory Visit: Payer: Federal, State, Local not specified - PPO | Admitting: Physician Assistant

## 2023-06-01 ENCOUNTER — Encounter: Payer: Self-pay | Admitting: Physician Assistant

## 2023-06-01 VITALS — BP 140/97 | HR 79 | Ht 64.0 in | Wt 192.0 lb

## 2023-06-01 DIAGNOSIS — G43011 Migraine without aura, intractable, with status migrainosus: Secondary | ICD-10-CM | POA: Diagnosis not present

## 2023-06-01 DIAGNOSIS — J209 Acute bronchitis, unspecified: Secondary | ICD-10-CM | POA: Diagnosis not present

## 2023-06-01 DIAGNOSIS — M5136 Other intervertebral disc degeneration, lumbar region: Secondary | ICD-10-CM | POA: Diagnosis not present

## 2023-06-01 DIAGNOSIS — R1012 Left upper quadrant pain: Secondary | ICD-10-CM | POA: Diagnosis not present

## 2023-06-01 DIAGNOSIS — R7303 Prediabetes: Secondary | ICD-10-CM

## 2023-06-01 DIAGNOSIS — R944 Abnormal results of kidney function studies: Secondary | ICD-10-CM | POA: Diagnosis not present

## 2023-06-01 DIAGNOSIS — I10 Essential (primary) hypertension: Secondary | ICD-10-CM

## 2023-06-01 DIAGNOSIS — N1330 Unspecified hydronephrosis: Secondary | ICD-10-CM

## 2023-06-01 MED ORDER — DAPAGLIFLOZIN PRO-METFORMIN ER 10-1000 MG PO TB24: 1.0000 | ORAL_TABLET | Freq: Every day | ORAL | 0 refills | Status: DC

## 2023-06-01 MED ORDER — DULOXETINE HCL 60 MG PO CPEP: ORAL_CAPSULE | ORAL | 1 refills | Status: DC

## 2023-06-01 MED ORDER — DEXAMETHASONE SODIUM PHOSPHATE 10 MG/ML IJ SOLN
10.0000 mg | Freq: Once | INTRAMUSCULAR | Status: AC
Start: 2023-06-01 — End: 2023-06-01
  Administered 2023-06-01: 10 mg via INTRAMUSCULAR

## 2023-06-01 MED ORDER — LISINOPRIL 5 MG PO TABS
5.0000 mg | ORAL_TABLET | Freq: Every day | ORAL | 0 refills | Status: DC
Start: 2023-06-01 — End: 2023-09-02

## 2023-06-01 MED ORDER — PREDNISONE 10 MG PO TABS: 30.0000 mg | ORAL_TABLET | Freq: Every day | ORAL | 0 refills | Status: DC

## 2023-06-01 MED ORDER — KETOROLAC TROMETHAMINE 60 MG/2ML IM SOLN
60.0000 mg | Freq: Once | INTRAMUSCULAR | Status: AC
Start: 2023-06-01 — End: 2023-06-01
  Administered 2023-06-01: 60 mg via INTRAMUSCULAR

## 2023-06-01 NOTE — Progress Notes (Signed)
Established Patient Office Visit  Subjective   Patient ID: Angela Oliver, female    DOB: 17-Aug-1966  Age: 57 y.o. MRN: [id number]  Chief Complaint  Patient presents with   Follow-up    Pre-Diabetic   Hypertension    HPI Pt is a 57 yo obese female with HTN, Migraines, Asthma, GERD, pre-diabetes and chronic pain due to DDD who presents to the clinic for follow up.   Pt is currently working with GI on left upper abdominal pain. While in office he did see hydronephrosis of left kidney. CT done with no acute findings. Referral was made to urology. Continues to have abdominal pain with no answers.   Not checking BP at home regularly. No CP, palpitations, headaches or vision changes. No peripheral swelling or SOB.   Migraines fairly well controlled. She does have one today for the last 3 days.   She also has a dry cough for the last week. No fever, chills, sinus pressure, ST, congestion. Taking some OTC medications with no benefit.   Patient Active Problem List   Diagnosis Date Noted   Intractable migraine without aura and with status migrainosus 06/15/2023   Decreased GFR 03/02/2023   Costochondritis 01/07/2023   Pre-diabetes 01/07/2023   Persistent cough 01/07/2023   Dyslipidemia (high LDL; low HDL) 11/01/2022   Hypertriglyceridemia 11/01/2022   Elevated blood-pressure reading without diagnosis of hypertension 10/29/2022   Primary hypertension 10/29/2022   Tear of medial collateral ligament of right knee 09/01/2022   Acute abdominal pain 09/01/2022   Insertional Achilles tendinitis, right leg, retrocalcaneal bursitis 05/21/2022   Migraine without aura and without status migrainosus, not intractable 01/26/2021   Chronic migraine without aura without status migrainosus, not intractable 01/21/2021   Abnormal brain MRI 09/30/2020   OAB (overactive bladder) 05/28/2020   Left upper quadrant pain 05/28/2020   Abdominal distension (gaseous) 05/28/2020   Epigastric pain 11/26/2019    Cyst of left kidney 09/10/2019   Right kidney stone 09/10/2019   Seasonal allergies 03/18/2019   Stress reaction 01/01/2019   DDD (degenerative disc disease), lumbar 01/01/2019   Dysfunction of left eustachian tube 02/01/2018   Carpal tunnel syndrome on both sides 11/29/2017   Pain of right breast 07/07/2017   Family history of breast cancer in mother 07/07/2017   Obesity (BMI 30.0-34.9) 01/27/2017   C7 radiculopathy 12/01/2015   Lumbar spondylosis 02/19/2014   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/2012   Trochanteric bursitis of both hips 11/03/2012   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   Herpes simplex 10/23/2012   Allergic rhinitis 06/09/2011   Asthma 06/09/2011   GERD 06/09/2011   Past Medical History:  Diagnosis Date   Anxiety    Arthritis    Asthma    Complication of anesthesia    slow to awaken   Family history of adverse reaction to anesthesia    mother slow to awaken   GERD (gastroesophageal reflux disease)    Herpes simplex    Irregular heart beat    in the past- no palpations- "skipped a beat"   Migraines    Seasonal allergies    Past Surgical History:  Procedure Laterality Date   APPENDECTOMY     CHOLECYSTECTOMY  06/01/2016   laproscopic    CHOLECYSTECTOMY N/A 06/01/2016   Procedure: LAPAROSCOPIC CHOLECYSTECTOMY WITH INTRAOPERATIVE CHOLANGIOGRAM, REMOVAL PERITONEAL NODULE ADJACENT TO SIGMOID COLON;  Surgeon: Avel Peace, MD;  Location: [facility] OR;  Service: General;  Laterality: N/A;   COLONOSCOPY     age 60   OVARIAN CYST REMOVAL  Left    part of ovary- cyst hadf ruptured   Family History  Problem Relation Age of Onset   Cancer Mother        breast cancer   Alcohol abuse Father    Diabetes Father    Hyperlipidemia Father    Asthma Other    Allergies  Allergen Reactions   Sulfa Antibiotics Hives, Shortness Of Breath, Swelling and Rash    Throat swelling   Topamax [Topiramate] Other (See Comments)    Severe stomach pain   Ozempic (0.25 Or 0.5 Mg-Dose)  [Semaglutide(0.25 Or 0.5mg -Dos)]     Nausea/vomiting    Sumatriptan Swelling      ROS See HPI.    Objective:     BP (!) 140/97 (BP Location: Right Arm, Patient Position: Sitting, Cuff Size: Normal)   Pulse 79   Ht 5\' 4"  (1.626 m)   Wt 192 lb (87.1 kg)   LMP 11/12/2016 (Approximate)   SpO2 99%   BMI 32.96 kg/m  BP Readings from Last 3 Encounters:  06/01/23 (!) 140/97  03/30/23 129/75  03/02/23 (!) 140/77   Wt Readings from Last 3 Encounters:  06/01/23 192 lb (87.1 kg)  03/02/23 198 lb (89.8 kg)  01/05/23 209 lb 1.3 oz (94.8 kg)    ..    06/01/2023    9:27 AM 03/02/2023    8:35 AM 01/05/2023    2:12 PM 10/01/2022   11:17 AM 06/25/2022   11:09 AM  Depression screen PHQ 2/9  Decreased Interest 0 0 0 0 1  Down, Depressed, Hopeless 0 0 0 0 0  PHQ - 2 Score 0 0 0 0 1   .Marland Kitchen Results for orders placed or performed in visit on 06/01/23  Hemoglobin A1c  Result Value Ref Range   Hgb A1c MFr Bld 5.6 <5.7 % of total Hgb   Mean Plasma Glucose 114 mg/dL   eAG (mmol/L) 6.3 mmol/L  BASIC METABOLIC PANEL WITH GFR  Result Value Ref Range   Glucose, Bld 95 65 - 99 mg/dL   BUN 14 7 - 25 mg/dL   Creat 1.61 0.96 - 0.45 mg/dL   eGFR 85 > OR = 60 WU/JWJ/1.91Y7   BUN/Creatinine Ratio SEE NOTE: 6 - 22 (calc)   Sodium 141 135 - 146 mmol/L   Potassium 4.3 3.5 - 5.3 mmol/L   Chloride 106 98 - 110 mmol/L   CO2 22 20 - 32 mmol/L   Calcium 9.9 8.6 - 10.4 mg/dL  Celiac Disease Panel  Result Value Ref Range   Immunoglobulin A 214 47 - 310 mg/dL   Gliadin IgA 1.2 U/mL   Gliadin IgG <1.0 U/mL   (tTG) Ab, IgG <1.0 U/mL   (tTG) Ab, IgA <1.0 U/mL     Physical Exam Constitutional:      Appearance: Normal appearance. She is obese.  HENT:     Head: Normocephalic.     Right Ear: Tympanic membrane, ear canal and external ear normal. There is no impacted cerumen.     Left Ear: Tympanic membrane, ear canal and external ear normal. There is no impacted cerumen.     Nose: Nose normal.      Mouth/Throat:     Mouth: Mucous membranes are moist.  Eyes:     Conjunctiva/sclera: Conjunctivae normal.  Cardiovascular:     Rate and Rhythm: Normal rate and regular rhythm.     Pulses: Normal pulses.  Pulmonary:     Effort: Pulmonary effort is normal.  Breath sounds: Normal breath sounds. No wheezing or rhonchi.  Musculoskeletal:     Cervical back: Neck supple.     Right lower leg: No edema.     Left lower leg: No edema.  Lymphadenopathy:     Cervical: No cervical adenopathy.  Neurological:     General: No focal deficit present.     Mental Status: She is alert and oriented to person, place, and time.  Psychiatric:        Mood and Affect: Mood normal.      The 10-year ASCVD risk score (Arnett DK, et al., 2019) is: 6.3%    Assessment & Plan:  .Marland KitchenMarland KitchenAxel was seen today for follow-up and hypertension.  Diagnoses and all orders for this visit:  Acute bronchitis, unspecified organism -     predniSONE (DELTASONE) 10 MG tablet; Take 3 tablets (30 mg total) by mouth daily. -     dexamethasone (DECADRON) injection 10 mg  DDD (degenerative disc disease), lumbar -     DULoxetine (CYMBALTA) 60 MG capsule; TAKE 1 CAPSULE BY MOUTH EVERY DAY -     HYDROcodone-acetaminophen (NORCO) 5-325 MG tablet; Take 1 tablet by mouth every 6 (six) hours as needed.  Pre-diabetes -     Hemoglobin A1c -     Dapagliflozin Pro-metFORMIN ER (XIGDUO XR) 09-999 MG TB24; Take 1 tablet by mouth daily with breakfast.  Decreased GFR -     BASIC METABOLIC PANEL WITH GFR -     Dapagliflozin Pro-metFORMIN ER (XIGDUO XR) 09-999 MG TB24; Take 1 tablet by mouth daily with breakfast.  Primary hypertension -     lisinopril (ZESTRIL) 5 MG tablet; Take 1 tablet (5 mg total) by mouth daily. -     Dapagliflozin Pro-metFORMIN ER (XIGDUO XR) 09-999 MG TB24; Take 1 tablet by mouth daily with breakfast.  Intractable migraine without aura and with status migrainosus -     predniSONE (DELTASONE) 10 MG tablet; Take  3 tablets (30 mg total) by mouth daily. -     ketorolac (TORADOL) injection 60 mg -     dexamethasone (DECADRON) injection 10 mg  Left upper quadrant pain -     Hemoglobin A1c -     BASIC METABOLIC PANEL WITH GFR -     Celiac Disease Panel   BP not to goal Restart lisinopril.  Continue xigduo for GFR and HTN, recheck CMP.  Migraine today and given toradol/decadron GI provider not checked for celiac will add to labs Pt has urology referral for left hydronephrosis.  Treated with zpak/prednisone for bronchitis.  Follow up as needed or if symptoms persist or worsen.   Return in about 3 months (around 09/01/2023).    Tandy Gaw, PA-C

## 2023-06-01 NOTE — Telephone Encounter (Signed)
Pt just left from her appt here this morning and Pt called and states she would like to try the Amlodipine INSTEAD of the Lisinopril

## 2023-06-01 NOTE — Patient Instructions (Signed)
Restart lisinopril 5mg  daily Get labs

## 2023-06-02 ENCOUNTER — Encounter: Payer: Self-pay | Admitting: Physician Assistant

## 2023-06-02 LAB — CELIAC DISEASE PANEL
(tTG) Ab, IgA: <1 U/mL
(tTG) Ab, IgG: <1 U/mL
Gliadin IgA: 1.2 U/mL
Gliadin IgG: <1 U/mL
Immunoglobulin A: 214 mg/dL (ref 47–310)

## 2023-06-02 LAB — HEMOGLOBIN A1C
Hgb A1c MFr Bld: 5.6 % of total Hgb (ref <5.7)
Mean Plasma Glucose: 114 mg/dL
eAG (mmol/L): 6.3 mmol/L

## 2023-06-02 LAB — BASIC METABOLIC PANEL WITH GFR
BUN: 14 mg/dL (ref 7–25)
CO2: 22 mmol/L (ref 20–32)
Calcium: 9.9 mg/dL (ref 8.6–10.4)
Chloride: 106 mmol/L (ref 98–110)
Creat: 0.8 mg/dL (ref 0.50–1.03)
Glucose, Bld: 95 mg/dL (ref 65–99)
Potassium: 4.3 mmol/L (ref 3.5–5.3)
Sodium: 141 mmol/L (ref 135–146)
eGFR: 85 mL/min/{1.73_m2} (ref >=60)

## 2023-06-03 MED ORDER — HYDROCODONE-ACETAMINOPHEN 5-325 MG PO TABS
1.0000 | ORAL_TABLET | Freq: Four times a day (QID) | ORAL | 0 refills | Status: DC | PRN
Start: 2023-06-03 — End: 2023-09-02

## 2023-06-03 MED ORDER — AMLODIPINE BESYLATE 2.5 MG PO TABS: 2.5000 mg | ORAL_TABLET | Freq: Every day | ORAL | 0 refills | Status: DC

## 2023-06-03 NOTE — Progress Notes (Signed)
Angela Oliver,   A1C has improved and looks great.  Kidney, liver look good.  Celiac panel is normal.

## 2023-06-03 NOTE — Telephone Encounter (Signed)
Pt states the other is making her cough a lot and she already has Diarrhea, so she would like to have the Amlodipine. Thank you

## 2023-06-03 NOTE — Addendum Note (Signed)
Addended by: Jomarie Longs on: 06/03/2023 03:47 PM   Modules accepted: Orders

## 2023-06-08 DIAGNOSIS — N1330 Unspecified hydronephrosis: Secondary | ICD-10-CM | POA: Diagnosis not present

## 2023-06-08 DIAGNOSIS — N200 Calculus of kidney: Secondary | ICD-10-CM | POA: Diagnosis not present

## 2023-06-08 DIAGNOSIS — N289 Disorder of kidney and ureter, unspecified: Secondary | ICD-10-CM | POA: Diagnosis not present

## 2023-06-08 DIAGNOSIS — Z9049 Acquired absence of other specified parts of digestive tract: Secondary | ICD-10-CM | POA: Diagnosis not present

## 2023-06-08 NOTE — Telephone Encounter (Signed)
Pt aware.

## 2023-06-15 ENCOUNTER — Encounter: Payer: Self-pay | Admitting: Physician Assistant

## 2023-06-15 DIAGNOSIS — G43011 Migraine without aura, intractable, with status migrainosus: Secondary | ICD-10-CM | POA: Insufficient documentation

## 2023-06-21 DIAGNOSIS — N1330 Unspecified hydronephrosis: Secondary | ICD-10-CM | POA: Insufficient documentation

## 2023-07-05 ENCOUNTER — Other Ambulatory Visit: Payer: Self-pay | Admitting: Physician Assistant

## 2023-07-19 ENCOUNTER — Ambulatory Visit: Admit: 2023-07-19 | Payer: BLUE CROSS/BLUE SHIELD

## 2023-07-19 DIAGNOSIS — U071 COVID-19: Secondary | ICD-10-CM

## 2023-07-19 MED ORDER — PSEUDOEPH-BROMPHEN-DM 30-2-10 MG/5ML PO SYRP
2-30-10 | ORAL | 0 refills | Status: AC
Start: 2023-07-19 — End: ?

## 2023-07-19 MED ORDER — AZITHROMYCIN 250 MG PO TABS
250 | ORAL_TABLET | ORAL | 0 refills | Status: AC
Start: 2023-07-19 — End: ?

## 2023-07-19 MED ORDER — METHYLPREDNISOLONE 4 MG PO TBPK
4 MG | PACK | ORAL | 0 refills | Status: AC
Start: 2023-07-19 — End: ?

## 2023-07-19 NOTE — Progress Notes (Signed)
Chief Complaint       Cough (Chest congestion, sinus congestion and drainage has had for 5 days )      History of Present Illness   Source of history provided by:  patient.    Judy Williams is a 57 y.o. old female presenting to the walk in clinic for evaluation of cough, chest congestion, sinus congestion and drainage. Denies any SOB, CP, dyspnea, LE edema, abdominal pain, vomiting, rash, or lethargy. Reports hx of asthma or COPD. Patient has recent sick exposures. Patient has been vaccinated for COVID-19. Patient has been taking mucinex and Robitussin OTC without symptomatic relief.      ROS    Unless otherwise stated in this report or unable to obtain because of the patient's clinical or mental status as evidenced by the medical record, this patients's positive and negative responses for Review of Systems, constitutional, psych, eyes, ENT, cardiovascular, respiratory, gastrointestinal, neurological, genitourinary, musculoskeletal, integument systems and systems related to the presenting problem are either stated in the preceding or were not pertinent or were negative for the symptoms and/or complaints related to the medical problem.      Physical Exam         VS:  BP 132/77   Pulse 87   Temp 97.4 F (36.3 C) (Temporal)   Resp 19   Ht 1.61 m (5' 3.39")   Wt 83.9 kg (185 lb)   SpO2 98%   BMI 32.37 kg/m    Oxygen Saturation Interpretation: Normal.    Constitutional:  Alert, development consistent with age. NAD.  Head:  NC/NT. Airway patent.   Mouth: Posterior pharynx with mild erythema and clear postnasal drip. No tonsillar hypertrophy or exudate.   Neck:  Normal ROM. Supple. No anterior cervical adenopathy noted.  Lungs: CTAB without wheezes, rales, or rhonchi.   CV:  Regular rate and rhythm, normal heart sounds, without pathological murmurs, ectopy, gallops, or rubs.  Skin:  Normal turgor.  Warm, dry, without visible rash.  Lymphatic: No lymphangitis or adenopathy noted.  Neurological:  Oriented.   Motor functions intact.    Lab / Imaging Results   (All laboratory and radiology results have been personally reviewed by myself)  Labs:  No results found for this visit on 07/19/23.    Imaging:  All Radiology results interpreted by Radiologist unless otherwise noted.      Assessment / Plan     Impression(s):  Judy Williams was seen today for cough.    Diagnoses and all orders for this visit:    COVID    Other orders  -     azithromycin (ZITHROMAX Z-PAK) 250 MG tablet; Take 2 tabs on day one, then 1 tab daily for the next 4 days  -     methylPREDNISolone (MEDROL DOSEPACK) 4 MG tablet; Take by mouth.  -     brompheniramine-pseudoephedrine-DM 2-30-10 MG/5ML syrup; 5 - 10 mL by mouth every 6 hours as needed for cough / congestion.      Disposition:  Disposition: Discharge to Home.    Rapid COVID-19 testing is positive in office.  Increase fluids and rest. Symptomatic relief discussed including Tylenol prn pain/fever. Schedule f/u with PCP in 7-10 days if symptoms persist. ED sooner if symptoms worsen or change. ED immediately with high or refractory fever, progressive SOB, dyspnea, CP, calf pain/swelling, shaking chills, vomiting, abdominal pain, lethargy, flank pain, or decreased urinary output. Pt verbalizes understanding and is in agreement with plan of care. All questions answered.  Ardelle Lesches, APRN - CNP    **This report was transcribed using voice recognition software. Every effort was made to ensure accuracy; however, inadvertent computerized transcription errors may be present.

## 2023-08-26 ENCOUNTER — Encounter: Payer: Self-pay | Admitting: Physician Assistant

## 2023-09-02 ENCOUNTER — Ambulatory Visit: Payer: Federal, State, Local not specified - PPO | Admitting: Physician Assistant

## 2023-09-02 ENCOUNTER — Encounter: Payer: Self-pay | Admitting: Physician Assistant

## 2023-09-02 VITALS — BP 135/82 | HR 94 | Ht 64.0 in | Wt 189.8 lb

## 2023-09-02 DIAGNOSIS — I10 Essential (primary) hypertension: Secondary | ICD-10-CM | POA: Diagnosis not present

## 2023-09-02 DIAGNOSIS — G43709 Chronic migraine without aura, not intractable, without status migrainosus: Secondary | ICD-10-CM | POA: Diagnosis not present

## 2023-09-02 DIAGNOSIS — Z124 Encounter for screening for malignant neoplasm of cervix: Secondary | ICD-10-CM

## 2023-09-02 DIAGNOSIS — Z1231 Encounter for screening mammogram for malignant neoplasm of breast: Secondary | ICD-10-CM

## 2023-09-02 DIAGNOSIS — R7303 Prediabetes: Secondary | ICD-10-CM

## 2023-09-02 DIAGNOSIS — G894 Chronic pain syndrome: Secondary | ICD-10-CM

## 2023-09-02 DIAGNOSIS — M5136 Other intervertebral disc degeneration, lumbar region: Secondary | ICD-10-CM

## 2023-09-02 MED ORDER — LISINOPRIL 5 MG PO TABS: 5.0000 mg | ORAL_TABLET | Freq: Every day | ORAL | 0 refills | Status: DC

## 2023-09-02 MED ORDER — HYDROCODONE-ACETAMINOPHEN 5-325 MG PO TABS
ORAL_TABLET | ORAL | 0 refills | Status: DC
Start: 2023-09-02 — End: 2024-10-30

## 2023-09-02 MED ORDER — QULIPTA 30 MG PO TABS
ORAL_TABLET | ORAL | 3 refills | Status: DC
Start: 2023-09-02 — End: 2024-10-30

## 2023-09-02 NOTE — Progress Notes (Unsigned)
Established Patient Office Visit  Subjective   Patient ID: Angela Oliver, female    DOB: August 19, 1966  Age: 57 y.o. MRN: [id number]  Chief Complaint  Patient presents with   Medical Management of Chronic Issues    DM last A1C 5.6    HPI Pt is a 57 yo obese female with Migraines, Chronic low back pain due to DDD, pre-diabetes, decreased GFR, HTN, GERD, Asthma, Allergies who presents to the clinic for follow up.   Migraines are doing well. She did run out of qulipta and has noticed that she is having more. For the most part maxalt does rescue but makes her very sleepy.   She has been dx of pre diabetes. She is trying to watch her diet and limit sugars. She is on farxiga. She is not taking her sugars. She is not exercising.   She does need refill of norco for her chronic back pain. She uses as needed but averaging every other day but no more than once a day.    .. 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   Trochanteric bursitis of both hips 11/03/2012   Lumbar spondylosis 02/19/2014   C7 radiculopathy 12/01/2015   Obesity (BMI 30.0-34.9) 01/27/2017   Pain of right breast 07/07/2017   Family history of breast cancer in mother 07/07/2017   Carpal tunnel syndrome on both sides 11/29/2017   Dysfunction of left eustachian tube 02/01/2018   Stress reaction 01/01/2019   DDD (degenerative disc disease), lumbar 01/01/2019   Seasonal allergies 03/18/2019   Cyst of left kidney 09/10/2019   Right kidney stone 09/10/2019   Epigastric pain 11/26/2019   OAB (overactive bladder) 05/28/2020   Left upper quadrant pain 05/28/2020   Abdominal distension (gaseous) 05/28/2020   Abnormal brain MRI 09/30/2020   Chronic migraine without aura without status migrainosus, not intractable 01/21/2021   Migraine without  aura and without status migrainosus, not intractable 01/26/2021   Insertional Achilles tendinitis, right leg, retrocalcaneal bursitis 05/21/2022   Tear of medial collateral ligament of right knee 09/01/2022   Acute abdominal pain 09/01/2022   Elevated blood-pressure reading without diagnosis of hypertension 10/29/2022   Primary hypertension 10/29/2022   Dyslipidemia (high LDL; low HDL) 11/01/2022   Hypertriglyceridemia 11/01/2022   Costochondritis 01/07/2023   Pre-diabetes 01/07/2023   Persistent cough 01/07/2023   Decreased GFR 03/02/2023   Intractable migraine without aura and with status migrainosus 06/15/2023   Hydronephrosis of left kidney 06/21/2023   Chronic pain syndrome 09/02/2023   Primary osteoarthritis of both first carpometacarpal joints 09/05/2023   Resolved Ambulatory Problems    Diagnosis Date Noted   RUQ PAIN 06/09/2011   Migraines 10/23/2012   Neck muscle spasm 11/03/2012   Peroneal tendinitis of left lower extremity 12/29/2012   Trochanteric bursitis of right hip 11/22/2013   Left foot pain 11/22/2013   Foot pain, left 02/19/2014   Poison ivy dermatitis 07/04/2014   Intractable chronic migraine without aura and with status migrainosus 10/02/2014   Acute bronchitis 09/16/2015   Paresthesia of both hands 12/01/2015   Calculus of gallbladder without cholecystitis without obstruction 05/03/2016   Symptomatic cholelithiasis 06/01/2016   S/P cholecystectomy 07/16/2016   Low back sprain 07/28/2016   Abnormal weight gain 07/07/2017   Greater trochanteric bursitis of both hips 04/17/2021   Past Medical History:  Diagnosis  Date   Anxiety    Arthritis    Asthma    Complication of anesthesia    Family history of adverse reaction to anesthesia    GERD (gastroesophageal reflux disease)    Irregular heart beat      ROS See HPI.    Objective:     BP 135/82 (BP Location: Right Arm, Patient Position: Sitting, Cuff Size: Large)   Pulse 94   Ht 5\' 4"  (1.626 m)    Wt 189 lb 12 oz (86.1 kg)   LMP 11/12/2016 (Approximate)   SpO2 97%   BMI 32.57 kg/m  BP Readings from Last 3 Encounters:  09/02/23 135/82  06/01/23 (!) 140/97  03/30/23 129/75   Wt Readings from Last 3 Encounters:  09/02/23 189 lb 12 oz (86.1 kg)  06/01/23 192 lb (87.1 kg)  03/02/23 198 lb (89.8 kg)    .Marland Kitchen Lab Results  Component Value Date   HGBA1C 5.6 06/01/2023    Physical Exam Constitutional:      Appearance: Normal appearance. She is obese.  HENT:     Head: Normocephalic.  Cardiovascular:     Rate and Rhythm: Normal rate and regular rhythm.  Pulmonary:     Effort: Pulmonary effort is normal.     Breath sounds: Normal breath sounds.  Musculoskeletal:     Cervical back: Normal range of motion.     Right lower leg: No edema.     Left lower leg: No edema.  Neurological:     General: No focal deficit present.     Mental Status: She is alert and oriented to person, place, and time.  Psychiatric:        Mood and Affect: Mood normal.        The 10-year ASCVD risk score (Arnett DK, et al., 2019) is: 5.9%    Assessment & Plan:  Marland KitchenMarland KitchenAnnastazia was seen today for medical management of chronic issues.  Diagnoses and all orders for this visit:  Pre-diabetes -     POCT HgB A1C  Chronic migraine without aura without status migrainosus, not intractable -     Atogepant (QULIPTA) 30 MG TABS; Take one tablet daily for migraine.  Primary hypertension -     lisinopril (ZESTRIL) 5 MG tablet; Take 1 tablet (5 mg total) by mouth daily.  Visit for screening mammogram -     MM 3D SCREENING MAMMOGRAM BILATERAL BREAST  DDD (degenerative disc disease), lumbar -     HYDROcodone-acetaminophen (NORCO) 5-325 MG tablet; Take one tablet daily as needed for moderate severe pain.  Chronic pain syndrome -     Drug Scr Ur, Pain Mgmt, Reflex Conf  Cervical cancer screening -     Ambulatory referral to Obstetrics / Gynecology   Pain contract filled out today for no more than 30  tablets a month but ok to use less Urine drug screen was collected wrong today and will not be ran .Marland Kitchen  Opioid Risk Tool - 09/05/23 0830       Family History of Substance Abuse   Alcohol Negative    Illegal Drugs Negative    Rx Drugs Negative      Personal History of Substance Abuse   Alcohol Negative    Illegal Drugs Negative    Rx Drugs Negative      Age   Age between 16-45 years  No      History of Preadolescent Sexual Abuse   History of Preadolescent Sexual Abuse Negative or Female  Psychological Disease   Psychological Disease Negative    Depression Positive      Total Score   Opioid Risk Tool Scoring 1    Opioid Risk Interpretation Low Risk            .Marland KitchenPDMP reviewed during this encounter. No concerns Follow up in 3 months  Pre-diabetes follow up  A1C stable Continue with diet and exercise Stay on xigduo BP very close to goal  Refilled qulipta for migraines  Referral for pap testing and mammogram today.  Tandy Gaw, PA-C

## 2023-09-05 ENCOUNTER — Encounter: Payer: Self-pay | Admitting: Sports Medicine

## 2023-09-05 ENCOUNTER — Ambulatory Visit: Payer: Federal, State, Local not specified - PPO

## 2023-09-05 ENCOUNTER — Ambulatory Visit: Payer: Federal, State, Local not specified - PPO | Admitting: Sports Medicine

## 2023-09-05 ENCOUNTER — Other Ambulatory Visit (INDEPENDENT_AMBULATORY_CARE_PROVIDER_SITE_OTHER): Payer: Federal, State, Local not specified - PPO

## 2023-09-05 DIAGNOSIS — M79641 Pain in right hand: Secondary | ICD-10-CM | POA: Diagnosis not present

## 2023-09-05 DIAGNOSIS — M7661 Achilles tendinitis, right leg: Secondary | ICD-10-CM | POA: Diagnosis not present

## 2023-09-05 DIAGNOSIS — M79642 Pain in left hand: Secondary | ICD-10-CM | POA: Diagnosis not present

## 2023-09-05 DIAGNOSIS — M1812 Unilateral primary osteoarthritis of first carpometacarpal joint, left hand: Secondary | ICD-10-CM | POA: Diagnosis not present

## 2023-09-05 DIAGNOSIS — M180 Bilateral primary osteoarthritis of first carpometacarpal joints: Secondary | ICD-10-CM

## 2023-09-05 DIAGNOSIS — M1811 Unilateral primary osteoarthritis of first carpometacarpal joint, right hand: Secondary | ICD-10-CM | POA: Diagnosis not present

## 2023-09-05 MED ORDER — DICLOFENAC SODIUM 1 % EX GEL
2.0000 g | Freq: Four times a day (QID) | CUTANEOUS | 11 refills | Status: DC
Start: 2023-09-05 — End: 2024-10-30

## 2023-09-05 MED ORDER — TRIAMCINOLONE ACETONIDE 40 MG/ML IJ SUSP
40.0000 mg | Freq: Once | INTRAMUSCULAR | Status: AC
Start: 2023-09-05 — End: 2023-09-05
  Administered 2023-09-05: 40 mg via INTRAMUSCULAR

## 2023-09-05 NOTE — Progress Notes (Signed)
    Procedures performed today:    Procedure: Real-time Ultrasound Guided injection of the right retrocalcaneal bursa Device: Samsung HS60  Verbal informed consent obtained.  Time-out conducted.  Noted no overlying erythema, induration, or other signs of local infection.  Skin prepped in a sterile fashion.  Local anesthesia: Topical Ethyl chloride.  With sterile technique and under real time ultrasound guidance: Noted mild bursitis, taking care to avoid intra Achilles injection I placed 1 cc kenalog 40, 1 cc lidocaine into the bursa. Completed without difficulty  Advised to call if fevers/chills, erythema, induration, drainage, or persistent bleeding.  Images permanently stored and available for review in PACS.  Impression: Technically successful ultrasound guided injection.  Independent interpretation of notes and tests performed by another provider:   None.  Brief History, Exam, Impression, and Recommendations:    Insertional Achilles tendinitis, right leg, retrocalcaneal bursitis Last injected over a year ago, accidental trauma, increasing pain over the last month, not getting any better with conservative treatment, we reinjected her retrocalcaneal bursa today, she will get in the boot for at least 1 week, return to see me in 6 weeks.  Primary osteoarthritis of both first carpometacarpal joints Pain bilateral thumb basal joints, suspect basal joint osteoarthritis, adding x-rays, bilateral thumb loops, topical diclofenac, home PT, return to see me in 6 weeks, injection if not better.    ____________________________________________ [name]. Benjamin Stain, M.D., ABFM., CAQSM., AME. Primary Care and Sports Medicine [facility]  Adjunct Professor of Family Medicine  [place] [employer] [facility] [employer]  Restaurant manager, fast food

## 2023-09-05 NOTE — Assessment & Plan Note (Signed)
Pain bilateral thumb basal joints, suspect basal joint osteoarthritis, adding x-rays, bilateral thumb loops, topical diclofenac, home PT, return to see me in 6 weeks, injection if not better.

## 2023-09-05 NOTE — Assessment & Plan Note (Signed)
Last injected over a year ago, accidental trauma, increasing pain over the last month, not getting any better with conservative treatment, we reinjected her retrocalcaneal bursa today, she will get in the boot for at least 1 week, return to see me in 6 weeks.

## 2023-09-05 NOTE — Addendum Note (Signed)
Addended by: Carren Rang A on: 09/05/2023 04:43 PM   Modules accepted: Orders

## 2023-09-09 ENCOUNTER — Telehealth: Payer: Self-pay

## 2023-09-09 NOTE — Telephone Encounter (Signed)
Initiated Prior authorization ZOX:[id number] 30MG  tablets Via: Covermymeds Case/Key:[id number] Status: Pending as of 09/09/23 Reason:Authorization Expiration Date: 09/07/2024 Notified Pt via: Mychart

## 2023-09-09 NOTE — Telephone Encounter (Signed)
Spoke with pharmacy  Bennie Pierini ran through with insurance approval but  Cost is $293.00 for 3 mth supply and $121.52 for 1 moth supply  Patient informed and this amount is too expensive. She will check into manufacturer coupon to see if can lower cost  If not she will let us know so we can forward to provider to see if any other options

## 2023-10-13 ENCOUNTER — Telehealth: Payer: Self-pay | Admitting: Physician Assistant

## 2023-10-13 NOTE — Telephone Encounter (Signed)
Patient send a my chart message asking please check with PA [facility] regarding pain med plan I am on. Do I need to see her today because of the pain contract?

## 2023-10-14 NOTE — Telephone Encounter (Signed)
Does she need refill or dose change?

## 2023-10-17 ENCOUNTER — Ambulatory Visit: Payer: Federal, State, Local not specified - PPO | Admitting: Sports Medicine

## 2023-10-17 ENCOUNTER — Other Ambulatory Visit (INDEPENDENT_AMBULATORY_CARE_PROVIDER_SITE_OTHER): Payer: Federal, State, Local not specified - PPO

## 2023-10-17 DIAGNOSIS — M7661 Achilles tendinitis, right leg: Secondary | ICD-10-CM | POA: Diagnosis not present

## 2023-10-17 DIAGNOSIS — M180 Bilateral primary osteoarthritis of first carpometacarpal joints: Secondary | ICD-10-CM

## 2023-10-17 NOTE — Progress Notes (Signed)
    Procedures performed today:    Procedure: Real-time Ultrasound Guided injection of the right first [facility] Device: Samsung HS60  Verbal informed consent obtained.  Time-out conducted.  Noted no overlying erythema, induration, or other signs of local infection.  Skin prepped in a sterile fashion.  Local anesthesia: Topical Ethyl chloride.  With sterile technique and under real time ultrasound guidance: Arthritic joint noted, 1/2 cc lidocaine, 1/2 cc kenalog 40 injected easily. Completed without difficulty  Advised to call if fevers/chills, erythema, induration, drainage, or persistent bleeding.  Images permanently stored and available for review in PACS.  Impression: Technically successful ultrasound guided injection.  Independent interpretation of notes and tests performed by another provider:   None.  Brief History, Exam, Impression, and Recommendations:    Primary osteoarthritis of both first carpometacarpal joints Thumb basal joint pain confirmed on x-rays with osteoarthritis, bilateral thumb loops, topical diclofenac and home PT have not been effective, left side is okay, right side still hurting significantly, today we did a first CMC injection, return to see me in 6 weeks.  Insertional Achilles tendinitis, right leg, retrocalcaneal bursitis History of insertional Achilles tendinosis with retrocalcaneal bursitis, we injected this taken care to avoid intra Achilles placement of medication back in September, she did a boot for a week and returns today symptom-free.    ____________________________________________ [name]. Benjamin Stain, M.D., ABFM., CAQSM., AME. Primary Care and Sports Medicine [facility]  Adjunct Professor of Family Medicine  [place] [employer] [facility] [employer]  Restaurant manager, fast food

## 2023-10-17 NOTE — Assessment & Plan Note (Signed)
Thumb basal joint pain confirmed on x-rays with osteoarthritis, bilateral thumb loops, topical diclofenac and home PT have not been effective, left side is okay, right side still hurting significantly, today we did a first CMC injection, return to see me in 6 weeks.

## 2023-10-17 NOTE — Assessment & Plan Note (Signed)
History of insertional Achilles tendinosis with retrocalcaneal bursitis, we injected this taken care to avoid intra Achilles placement of medication back in September, she did a boot for a week and returns today symptom-free.

## 2023-10-18 DIAGNOSIS — C7931 Secondary malignant neoplasm of brain: Secondary | ICD-10-CM | POA: Diagnosis not present

## 2023-10-18 DIAGNOSIS — C649 Malignant neoplasm of unspecified kidney, except renal pelvis: Secondary | ICD-10-CM | POA: Diagnosis not present

## 2023-10-18 DIAGNOSIS — G939 Disorder of brain, unspecified: Secondary | ICD-10-CM | POA: Diagnosis not present

## 2023-10-19 ENCOUNTER — Ambulatory Visit: Payer: Federal, State, Local not specified - PPO

## 2023-10-19 DIAGNOSIS — Z1231 Encounter for screening mammogram for malignant neoplasm of breast: Secondary | ICD-10-CM

## 2023-10-24 NOTE — Progress Notes (Signed)
Normal mammogram. Follow up in 1 year.

## 2023-10-27 ENCOUNTER — Encounter: Admit: 2023-10-27 | Payer: MEDICAID

## 2023-11-28 ENCOUNTER — Other Ambulatory Visit (INDEPENDENT_AMBULATORY_CARE_PROVIDER_SITE_OTHER): Payer: Federal, State, Local not specified - PPO

## 2023-11-28 ENCOUNTER — Ambulatory Visit: Payer: Federal, State, Local not specified - PPO | Admitting: Sports Medicine

## 2023-11-28 DIAGNOSIS — M180 Bilateral primary osteoarthritis of first carpometacarpal joints: Secondary | ICD-10-CM

## 2023-11-28 NOTE — Assessment & Plan Note (Signed)
Very pleasant 57 year old female, bilateral thumb basal joint osteoarthritis, she is currently doing bilateral thumb loops, topical diclofenac, home PT, has significant pain right side at the last visit so we did a CMC injection and she has responded well, now would like a CMC injection on the left side. We did this today, return as needed.

## 2023-11-28 NOTE — Progress Notes (Signed)
    Procedures performed today:    Procedure: Real-time Ultrasound Guided injection of the left first [facility] Device: Samsung HS60  Verbal informed consent obtained.  Time-out conducted.  Noted no overlying erythema, induration, or other signs of local infection.  Skin prepped in a sterile fashion.  Local anesthesia: Topical Ethyl chloride.  With sterile technique and under real time ultrasound guidance: Arthritic joint noted, 1/2 cc lidocaine, 1/2 cc kenalog 40 injected easily. Completed without difficulty  Advised to call if fevers/chills, erythema, induration, drainage, or persistent bleeding.  Images permanently stored and available for review in PACS.  Impression: Technically successful ultrasound guided injection.  Independent interpretation of notes and tests performed by another provider:   None.  Brief History, Exam, Impression, and Recommendations:    Primary osteoarthritis of both first carpometacarpal joints Very pleasant 57 year old female, bilateral thumb basal joint osteoarthritis, she is currently doing bilateral thumb loops, topical diclofenac, home PT, has significant pain right side at the last visit so we did a CMC injection and she has responded well, now would like a CMC injection on the left side. We did this today, return as needed.    ____________________________________________ [name]. Benjamin Stain, M.D., ABFM., CAQSM., AME. Primary Care and Sports Medicine [facility]  Adjunct Professor of Family Medicine  [place] [employer] [facility] [employer]  Restaurant manager, fast food

## 2023-12-02 ENCOUNTER — Ambulatory Visit: Payer: Federal, State, Local not specified - PPO | Admitting: Physician Assistant

## 2023-12-10 ENCOUNTER — Telehealth: Payer: Federal, State, Local not specified - PPO | Admitting: Physician Assistant

## 2023-12-10 DIAGNOSIS — B001 Herpesviral vesicular dermatitis: Secondary | ICD-10-CM

## 2023-12-10 MED ORDER — ACYCLOVIR 800 MG PO TABS: 800.0000 mg | ORAL_TABLET | Freq: Two times a day (BID) | ORAL | 0 refills | Status: AC

## 2023-12-10 NOTE — Progress Notes (Signed)
 E-Visit for [employer]  We are sorry that you are not feeling well.  Here is how we plan to help!  Based on what you have shared with me it does look like you have a viral infection.    Most cold sores or fever blisters are small fluid filled blisters around the mouth caused by herpes simplex virus.  The most common strain of the virus causing cold sores is herpes simplex virus 1.  It can be spread by skin contact, sharing eating utensils, or even sharing towels.  Cold sores are contagious to other people until dry. (Approximately 5-7 days).  Wash your hands. You can spread the virus to your eyes through handling your contact lenses after touching the lesions.  Most people experience pain at the sight or tingling sensations in their lips that may begin before the ulcers erupt.  Herpes simplex is treatable but not curable.  It may lie dormant for a long time and then reappear due to stress or prolonged sun exposure.  Many patients have success in treating their cold sores with an over the counter topical called Abreva.  You may apply the cream up to 5 times daily (maximum 10 days) until healing occurs.  If you would like to use an oral antiviral medication to speed the healing of your cold sore, I have sent a prescription to your local pharmacy Acyclovir 800 mg take one by mouth twice a day for 7 days    HOME CARE:  Wash your hands frequently. Do not pick at or rub the sore. Don't open the blisters. Avoid kissing other people during this time. Avoid sharing drinking glasses, eating utensils, or razors. Do not handle contact lenses unless you have thoroughly washed your hands with soap and warm water! Avoid oral sex during this time.  Herpes from sores on your mouth can spread to your partner's genital area. Avoid contact with anyone who has eczema or a weakened immune system. Cold sores are often triggered by exposure to intense sunlight, use a lip balm containing a sunscreen (SPF 30 or  higher).  GET HELP RIGHT AWAY IF:  Blisters look infected. Blisters occur near or in the eye. Symptoms last longer than 10 days. Your symptoms become worse.  MAKE SURE YOU:  Understand these instructions. Will watch your condition. Will get help right away if you are not doing well or get worse.    Your e-visit answers were reviewed by a board certified advanced clinical practitioner to complete your personal care plan.  Depending upon the condition, your plan could have  Included both over the counter or prescription medications.    Please review your pharmacy choice.  Be sure that the pharmacy you have chosen is open so that you can pick up your prescription now.  If there is a problem you can message your provider in MyChart to have the prescription routed to another pharmacy.    Your safety is important to [place].  If you have drug allergies check our prescription carefully.  For the next 24 hours you can use MyChart to ask questions about today's visit, request a non-urgent call back, or ask for a work or school excuse from your e-visit provider.  You will get an email in the next two days asking about your experience.  I hope that your e-visit has been valuable and will speed your recovery.

## 2023-12-10 NOTE — Progress Notes (Signed)
I have spent 5 minutes in review of e-visit questionnaire, review and updating patient chart, medical decision making and response to patient.   Laure Kidney, PA-C

## 2023-12-13 ENCOUNTER — Other Ambulatory Visit: Payer: Self-pay | Admitting: Physician Assistant

## 2023-12-13 DIAGNOSIS — I10 Essential (primary) hypertension: Secondary | ICD-10-CM

## 2023-12-13 DIAGNOSIS — R944 Abnormal results of kidney function studies: Secondary | ICD-10-CM

## 2023-12-13 DIAGNOSIS — R7303 Prediabetes: Secondary | ICD-10-CM

## 2023-12-13 DIAGNOSIS — K219 Gastro-esophageal reflux disease without esophagitis: Secondary | ICD-10-CM

## 2023-12-26 ENCOUNTER — Encounter: Payer: Self-pay | Admitting: Physician Assistant

## 2023-12-26 MED ORDER — ACYCLOVIR 5 % EX OINT: 1.0000 | TOPICAL_OINTMENT | CUTANEOUS | 0 refills | Status: DC

## 2023-12-26 MED ORDER — ACYCLOVIR 400 MG PO TABS: 800.0000 mg | ORAL_TABLET | Freq: Every day | ORAL | 0 refills | Status: AC

## 2024-03-26 ENCOUNTER — Ambulatory Visit: Admitting: Family Medicine

## 2024-03-26 ENCOUNTER — Ambulatory Visit: Payer: Self-pay | Admitting: *Deleted

## 2024-03-26 ENCOUNTER — Encounter: Payer: Self-pay | Admitting: Family Medicine

## 2024-03-26 VITALS — BP 112/74 | HR 80 | Temp 98.2°F | Ht 64.0 in | Wt 192.0 lb

## 2024-03-26 DIAGNOSIS — J029 Acute pharyngitis, unspecified: Secondary | ICD-10-CM | POA: Diagnosis not present

## 2024-03-26 DIAGNOSIS — J3089 Other allergic rhinitis: Secondary | ICD-10-CM

## 2024-03-26 DIAGNOSIS — R09A2 Foreign body sensation, throat: Secondary | ICD-10-CM

## 2024-03-26 DIAGNOSIS — J4530 Mild persistent asthma, uncomplicated: Secondary | ICD-10-CM

## 2024-03-26 LAB — POCT RAPID STREP A (OFFICE): Rapid Strep A Screen: NEGATIVE

## 2024-03-26 MED ORDER — ALBUTEROL SULFATE HFA 108 (90 BASE) MCG/ACT IN AERS: 2.0000 | INHALATION_SPRAY | Freq: Four times a day (QID) | RESPIRATORY_TRACT | 0 refills | Status: DC | PRN

## 2024-03-26 MED ORDER — MONTELUKAST SODIUM 10 MG PO TABS: ORAL_TABLET | ORAL | 1 refills | Status: DC

## 2024-03-26 NOTE — Progress Notes (Signed)
 Angela Oliver - 58 y.o. female MRN [id number]  Date of birth: [birth year]  Subjective Chief Complaint  Patient presents with   Gas   Sore Throat    HPI Angela Oliver is a 58 y.o. female here today with complaint of globus sensation.  Few days ago she noticed feeling of lump in her throat when swallowing.  She notices this when trying to swallow both solids and liquids.  She does have mild pain.  She has had some drainage but nothing too significant.  She does have a history of asthma and has been off of her medications for treatment of this.  But has had some dyspnea with exertion.  She has also had history of reflux but feels like this is pretty well-controlled.  Appetite has been okay.  She denies fever or chills.  ROS:  A comprehensive ROS was completed and negative except as noted per HPI  Allergies  Allergen Reactions   Sulfa Antibiotics Hives, Shortness Of Breath, Swelling and Rash    Throat swelling   Topamax [Topiramate] Other (See Comments)    Severe stomach pain   Ozempic (0.25 Or 0.5 Mg-Dose) [Semaglutide(0.25 Or 0.5mg -Dos)]     Nausea/vomiting    Sumatriptan Swelling    Past Medical History:  Diagnosis Date   Anxiety    Arthritis    Asthma    Complication of anesthesia    slow to awaken   Family history of adverse reaction to anesthesia    mother slow to awaken   GERD (gastroesophageal reflux disease)    Herpes simplex    Irregular heart beat    in the past- no palpations- "skipped a beat"   Migraines    Seasonal allergies     Past Surgical History:  Procedure Laterality Date   APPENDECTOMY     CHOLECYSTECTOMY  06/01/2016   laproscopic    CHOLECYSTECTOMY N/A 06/01/2016   Procedure: LAPAROSCOPIC CHOLECYSTECTOMY WITH INTRAOPERATIVE CHOLANGIOGRAM, REMOVAL PERITONEAL NODULE ADJACENT TO SIGMOID COLON;  Surgeon: Avel Peace, MD;  Location: [facility] OR;  Service: General;  Laterality: N/A;   COLONOSCOPY     age 30   OVARIAN CYST REMOVAL Left    part of ovary- cyst  hadf ruptured    Social History   Socioeconomic History   Marital status: Significant Other    Spouse name: Not on file   Number of children: Not on file   Years of education: Not on file   Highest education level: Not on file  Occupational History   Not on file  Tobacco Use   Smoking status: Never   Smokeless tobacco: Never  Vaping Use   Vaping status: Never Used  Substance and Sexual Activity   Alcohol use: No   Drug use: No   Sexual activity: Not on file  Other Topics Concern   Not on file  Social History Narrative   Not on file   Social Drivers of Health   Financial Resource Strain: Low Risk  (07/19/2023)   Received from [facility]   Overall Financial Resource Strain (CARDIA)    Difficulty of Paying Living Expenses: Not very hard  Food Insecurity: No Food Insecurity (07/19/2023)   Received from [facility]   Hunger Vital Sign    Worried About Running Out of Food in the Last Year: Never true    Ran Out of Food in the Last Year: Never true  Transportation Needs: Unknown (07/19/2023)   Received from [facility]   PRAPARE - Administrator, Civil Service (Medical): Not on file    Lack of Transportation (Non-Medical): No  Physical Activity: Not on file  Stress: Not on file  Social Connections: Unknown (04/25/2022)   [facility] from [facility]   Social Network    Social Network: Not on file    Family History  Problem Relation Age of Onset   Cancer Mother        breast cancer   Alcohol abuse Father    Diabetes Father    Hyperlipidemia Father    Asthma Other     Health Maintenance  Topic Date Due   HIV Screening  Never done   Zoster Vaccines- Shingrix (1 of 2) Never done   Cervical Cancer Screening (HPV/Pap Cotest)  Never done   COVID-19 Vaccine (3 - Moderna risk series) 10/06/2020   INFLUENZA VACCINE  07/13/2024   MAMMOGRAM  10/18/2025   Colonoscopy  01/13/2030    Hepatitis C Screening  Completed   HPV VACCINES  Aged Out   Meningococcal B Vaccine  Aged Out   DTaP/Tdap/Td  Discontinued   Pneumococcal Vaccine 37-64 Years old  Discontinued     ----------------------------------------------------------------------------------------------------------------------------------------------------------------------------------------------------------------- Physical Exam BP 112/74 (BP Location: Left Arm, Patient Position: Sitting, Cuff Size: Large)   Pulse 80   Temp 98.2 F (36.8 C) (Oral)   Ht 5\' 4"  (1.626 m)   Wt 192 lb (87.1 kg)   LMP 11/12/2016 (Approximate)   SpO2 98%   BMI 32.96 kg/m   Physical Exam Constitutional:      Appearance: Normal appearance. She is well-developed.  HENT:     Head: Normocephalic and atraumatic.  Eyes:     General: No scleral icterus. Cardiovascular:     Rate and Rhythm: Normal rate and regular rhythm.  Pulmonary:     Effort: Pulmonary effort is normal.     Breath sounds: Normal breath sounds.  Musculoskeletal:     Cervical back: Neck supple.  Neurological:     Mental Status: She is alert.  Psychiatric:        Mood and Affect: Mood normal.        Behavior: Behavior normal.     ------------------------------------------------------------------------------------------------------------------------------------------------------------------------------------------------------------------- Assessment and Plan  Globus sensation We discussed possible contributors and her causes of this.  Point-of-care strep testing is negative.  Will have her try increasing PPI to twice daily dosing for the next 2 weeks.  Restart medications for asthma as well.  If not improving or if continuing to worsen will refer to ENT.   Meds ordered this encounter  Medications   albuterol (VENTOLIN HFA) 108 (90 Base) MCG/ACT inhaler    Sig: Inhale 2 puffs into the lungs every 6 (six) hours as needed for wheezing.    Dispense:  1 each     Refill:  0    Ok for cheapest options.   montelukast (SINGULAIR) 10 MG tablet    Sig: TAKE 1 TABLET BY MOUTH EVERY DAY    Dispense:  90 tablet    Refill:  1    No follow-ups on file.

## 2024-03-26 NOTE — Assessment & Plan Note (Addendum)
 We discussed possible contributors and her causes of this.  Point-of-care strep testing is negative.  Will have her try increasing PPI to twice daily dosing for the next 2 weeks.  Restart medications for asthma as well.  If not improving or if continuing to worsen will refer to ENT.

## 2024-03-26 NOTE — Telephone Encounter (Signed)
 Appt scheduled today with DR. Augustus Ledger.

## 2024-03-26 NOTE — Telephone Encounter (Signed)
  Chief Complaint: sore throat, left side lymph nodes tender to touch. Requesting appt today due to "hot water heater" being installed tomorrow. Symptoms: severe sore throat pain, hurts to swallow liquids. Can tolerate own saliva. "Feels like golf ball" with swallowing. Chest heaviness at times with SOB with exertion. Dizziness. Sores around mouth, chills aches.   Frequency: Saturday 03/24/24 Pertinent Negatives: Patient denies chest pain with difficulty breathing all of the time. No fever.  Disposition: [] ED /[] Urgent Care (no appt availability in office) / [x] Appointment(In office/virtual)/ []  [facility]/ [] Home Care/ [] Refused Recommended Disposition /[] [facility]/ []  Follow-up with PCP Additional Notes:   No available appt today with PCP. Patient reports she has appt with house repairs tomorrow. Scheduled appt with other provider in office due to sx. Recommended if sx worsen go to ED. Please advise'      Copied from CRM [id number]. Topic: Clinical - Red Word Triage >> Mar 26, 2024  9:52 AM Angela Oliver wrote: Red Word that prompted transfer to Nurse Triage: trouble swallowing, says it hurts to even swallow liquids. Has sore around her mouth and also has been throwing up. Having some chills and aches as well. Started Saturday Reason for Disposition  SEVERE (e.g., excruciating) throat pain  Answer Assessment - Initial Assessment Questions 1. ONSET: "When did the throat start hurting?" (Hours or days ago)      Saturday 03/24/24 2. SEVERITY: "How bad is the sore throat?" (Scale 1-10; mild, moderate or severe)   - MILD (1-3):  Doesn't interfere with eating or normal activities.   - MODERATE (4-7): Interferes with eating some solids and normal activities.   - SEVERE (8-10):  Excruciating pain, interferes with most normal activities.   - SEVERE WITH DYSPHAGIA (10): Can't swallow liquids, drooling.     Feels like golf ball in throat . 6/10 can sleep  3. STREP EXPOSURE: "Has  there been any exposure to strep within the past week?" If Yes, ask: "What type of contact occurred?"      Na  4.  VIRAL SYMPTOMS: "Are there any symptoms of a cold, such as a runny nose, cough, hoarse voice or red eyes?"      No  5. FEVER: "Do you have a fever?" If Yes, ask: "What is your temperature, how was it measured, and when did it start?"     na 6. PUS ON THE TONSILS: "Is there pus on the tonsils in the back of your throat?"     No  7. OTHER SYMPTOMS: "Do you have any other symptoms?" (e.g., difficulty breathing, headache, rash)     Sore throat . Left side lymph nodes tender to touch chest heaviness, SOB with exertion 8. PREGNANCY: "Is there any chance you are pregnant?" "When was your last menstrual period?"     na  Protocols used: Sore Throat-A-AH

## 2024-03-26 NOTE — Patient Instructions (Signed)
 Increase omeprazole to twice daily for the next 2 weeks.   Restart asthma medications.   If not improving by 2 weeks or if you have worsening symptoms I think having you see ENT would be the next step.

## 2024-04-17 ENCOUNTER — Other Ambulatory Visit: Payer: Self-pay | Admitting: Family Medicine

## 2024-04-17 DIAGNOSIS — J4530 Mild persistent asthma, uncomplicated: Secondary | ICD-10-CM

## 2024-05-16 ENCOUNTER — Ambulatory Visit (INDEPENDENT_AMBULATORY_CARE_PROVIDER_SITE_OTHER): Admitting: Obstetrics and Gynecology

## 2024-05-16 ENCOUNTER — Encounter: Payer: Self-pay | Admitting: Obstetrics and Gynecology

## 2024-05-16 ENCOUNTER — Other Ambulatory Visit (HOSPITAL_COMMUNITY)
Admission: RE | Admit: 2024-05-16 | Discharge: 2024-05-16 | Disposition: A | Discharge status: Home / Self Care | Source: Ambulatory Visit | Attending: Obstetrics and Gynecology | Admitting: Obstetrics and Gynecology

## 2024-05-16 VITALS — BP 134/80 | HR 90 | Ht 63.0 in | Wt 195.0 lb

## 2024-05-16 DIAGNOSIS — Z01419 Encounter for gynecological examination (general) (routine) without abnormal findings: Secondary | ICD-10-CM | POA: Diagnosis present

## 2024-05-16 DIAGNOSIS — Z1151 Encounter for screening for human papillomavirus (HPV): Secondary | ICD-10-CM | POA: Diagnosis not present

## 2024-05-16 NOTE — Progress Notes (Signed)
   ANNUAL EXAM Patient name: Angela Oliver MRN [id number]  Date of birth: 1966-02-12 Chief Complaint:   No chief complaint on file.  History of Present Illness:   Angela Oliver is a 58 y.o. G0P0000 female being seen today for a routine annual exam.   Current concerns: None  Patient's last menstrual period was 11/12/2016 (approximate).   Last MXR: 10/2023 birad1 Last Pap/Pap History:  Denies any abnormal paps.  Last pap was 15 yrs ago.   Review of Systems:   Pertinent items are noted in HPI Denies any headaches, blurred vision, fatigue, shortness of breath, chest pain, abdominal pain, abnormal vaginal discharge/itching/odor/irritation, problems with periods, bowel movements, urination, or intercourse unless otherwise stated above.  Pertinent History Reviewed:  Reviewed past medical,surgical, social and family history.  Reviewed problem list, medications and allergies. Physical Assessment:   Vitals:   05/16/24 1412  BP: 134/80  Pulse: 90  Weight: 195 lb (88.5 kg)  Height: 5\' 3"  (1.6 m)  Body mass index is 34.54 kg/m.   Physical Examination:  General appearance - well appearing, and in no distress Mental status - alert, oriented to person, place, and time Psych:  She has a normal mood and affect Skin - warm and dry, normal color, no suspicious lesions noted Chest - effort normal Heart - normal rate  Breasts - Not examined - pt declined due to recent mammogram.  Abdomen - soft, nontender, nondistended, no masses or organomegaly Pelvic -  Performed and: VULVA: normal appearing vulva with no masses, tenderness or lesions  VAGINA: normal appearing vagina with normal color and discharge, no lesions  CERVIX: normal appearing cervix without discharge or lesions, no CMT UTERUS: Not examined ADNEXA: Not examined Extremities:  No swelling or varicosities noted  Chaperone present for exam  No results found for this or any previous visit (from the past 24 hours).  Assessment &  Plan:  Diagnoses and all orders for this visit:  Encounter for annual routine gynecological examination -     Cytology - PAP  - Cervical cancer screening: Discussed guidelines. Pap with HPV done  - Breast Health: Encouraged self breast awareness/SBE. Discussed limits of clinical breast exam for detecting breast cancer. Discussed importance of annual MXR. MXR is up to date: 10/2023 - Climacteric/Sexual health: Reviewed typical and atypical symptoms of menopause/peri-menopause. Discussed PMB and to call if any amount of spotting.  - Colonoscopy: February 2021 - F/U 12 months and prn   No orders of the defined types were placed in this encounter.   Meds: No orders of the defined types were placed in this encounter.   Follow-up: Return in about 1 year (around 05/16/2025).  Lacey Pian, MD 05/16/2024 2:42 PM

## 2024-05-18 ENCOUNTER — Ambulatory Visit: Payer: Self-pay | Admitting: Obstetrics and Gynecology

## 2024-05-18 LAB — CYTOLOGY - PAP
Comment: NEGATIVE
Diagnosis: NEGATIVE
High risk HPV: NEGATIVE

## 2024-07-17 ENCOUNTER — Telehealth: Payer: Self-pay

## 2024-07-17 ENCOUNTER — Other Ambulatory Visit (HOSPITAL_COMMUNITY): Payer: Self-pay

## 2024-07-17 NOTE — Telephone Encounter (Signed)
 Pharmacy Patient Advocate Encounter  Received notification from [facility] that Prior Authorization renewal for Qulipta  30mg  tabs has been APPROVED from 06/17/24 to 07/17/25   PA #/Case ID/Reference #: [id number]

## 2024-07-17 NOTE — Telephone Encounter (Signed)
 Pharmacy Patient Advocate Encounter   Received notification from CoverMyMeds that prior authorization for Qulipta  30mg  tabs is due for renewal.   Insurance verification completed.   The patient is insured through [facility] [employer].  Action: PA required; PA submitted to above mentioned insurance via CoverMyMeds Key/confirmation #/EOC [facility] Status is pending

## 2024-07-20 ENCOUNTER — Other Ambulatory Visit: Payer: Self-pay | Admitting: Physician Assistant

## 2024-07-20 ENCOUNTER — Other Ambulatory Visit: Payer: Self-pay | Admitting: Family Medicine

## 2024-07-20 DIAGNOSIS — J3089 Other allergic rhinitis: Secondary | ICD-10-CM

## 2024-07-20 DIAGNOSIS — J4530 Mild persistent asthma, uncomplicated: Secondary | ICD-10-CM

## 2024-07-20 DIAGNOSIS — I10 Essential (primary) hypertension: Secondary | ICD-10-CM

## 2024-07-20 DIAGNOSIS — M51369 Other intervertebral disc degeneration, lumbar region without mention of lumbar back pain or lower extremity pain: Secondary | ICD-10-CM

## 2024-08-14 ENCOUNTER — Encounter: Payer: Self-pay | Admitting: Sports Medicine

## 2024-10-30 ENCOUNTER — Ambulatory Visit (INDEPENDENT_AMBULATORY_CARE_PROVIDER_SITE_OTHER): Admitting: Family Medicine

## 2024-10-30 ENCOUNTER — Encounter: Payer: Self-pay | Admitting: Family Medicine

## 2024-10-30 VITALS — BP 136/84 | HR 84 | Temp 98.9°F | Ht 64.0 in | Wt 195.0 lb

## 2024-10-30 DIAGNOSIS — I10 Essential (primary) hypertension: Secondary | ICD-10-CM | POA: Diagnosis not present

## 2024-10-30 DIAGNOSIS — G43709 Chronic migraine without aura, not intractable, without status migrainosus: Secondary | ICD-10-CM | POA: Diagnosis not present

## 2024-10-30 DIAGNOSIS — R051 Acute cough: Secondary | ICD-10-CM | POA: Diagnosis not present

## 2024-10-30 DIAGNOSIS — M47816 Spondylosis without myelopathy or radiculopathy, lumbar region: Secondary | ICD-10-CM | POA: Diagnosis not present

## 2024-10-30 DIAGNOSIS — M5136 Other intervertebral disc degeneration, lumbar region with discogenic back pain only: Secondary | ICD-10-CM

## 2024-10-30 LAB — POC COVID19 BINAXNOW: SARS Coronavirus 2 Ag: NEGATIVE

## 2024-10-30 MED ORDER — HYDROCODONE BIT-HOMATROP MBR 5-1.5 MG/5ML PO SOLN: 5.0000 mL | Freq: Every evening | ORAL | 0 refills | Status: DC | PRN

## 2024-10-30 MED ORDER — GABAPENTIN 100 MG PO CAPS: 100.0000 mg | ORAL_CAPSULE | Freq: Every day | ORAL | 2 refills | Status: DC

## 2024-10-30 MED ORDER — LISINOPRIL 5 MG PO TABS: 5.0000 mg | ORAL_TABLET | Freq: Every day | ORAL | 0 refills | Status: AC

## 2024-10-30 MED ORDER — AMLODIPINE BESYLATE 2.5 MG PO TABS
2.5000 mg | ORAL_TABLET | Freq: Every day | ORAL | 1 refills | Status: AC
Start: 2024-10-30 — End: ?

## 2024-10-30 MED ORDER — QULIPTA 30 MG PO TABS: ORAL_TABLET | ORAL | 3 refills | Status: DC

## 2024-10-30 NOTE — Assessment & Plan Note (Signed)
Needs refills sent to pharmacy.

## 2024-10-30 NOTE — Progress Notes (Signed)
 Sxs started 3 days ago. She stated that her friend and BF are both sick and her friend was seen at [facility] and was told she had URI. She feels that she has the same thing.

## 2024-10-30 NOTE — Progress Notes (Signed)
 Acute Office Visit  Patient ID: Angela Oliver, female    DOB: [birth year], 58 y.o.   MRN: [id number]  PCP: Angela Vermell CROME, PA-C  Chief Complaint  Patient presents with   URI    Subjective:     HPI  Discussed the use of AI scribe software for clinical note transcription with the patient, who gave verbal consent to proceed.  History of Present Illness Angela Oliver is a 58 year old female who presents with insomnia and chronic pain management issues.  Insomnia - Difficulty initiating sleep, often awake until 2:00 AM - Attribution of sleep disturbance to chronic pain causing frequent tossing and turning - Interest in alternative medications for sleep management  Chronic pain - Chronic pain requiring ongoing management - Use of tramadol  for the past 1-2 years with diminishing effectiveness, possibly due to tolerance - History of nerve ablation in the back, last performed two years ago, with consideration of repeating the procedure due to prior benefit - Exploring alternative medications for pain control  Acute upper respiratory symptoms - Dry cough for three days - Sore throat and raspy voice - No fever - Chills and sweats last night - Recent exposure to boyfriend with illness for one and a half weeks and a friend with a respiratory infection   ROS     Objective:    BP 136/84   Pulse 84   Temp 98.9 F (37.2 C) (Oral)   Ht 5' 4 (1.626 m)   Wt 195 lb (88.5 kg)   LMP 11/12/2016 (Approximate)   SpO2 100%   BMI 33.47 kg/m    Physical Exam Constitutional:      Appearance: Normal appearance.  HENT:     Head: Normocephalic and atraumatic.     Right Ear: Tympanic membrane, ear canal and external ear normal. There is no impacted cerumen.     Left Ear: Tympanic membrane, ear canal and external ear normal. There is no impacted cerumen.     Nose: Nose normal.     Mouth/Throat:     Pharynx: Oropharynx is clear.  Eyes:     Conjunctiva/sclera: Conjunctivae normal.   Cardiovascular:     Rate and Rhythm: Normal rate and regular rhythm.  Pulmonary:     Effort: Pulmonary effort is normal.     Breath sounds: Normal breath sounds.  Musculoskeletal:     Cervical back: Neck supple. No tenderness.  Lymphadenopathy:     Cervical: No cervical adenopathy.  Skin:    General: Skin is warm and dry.  Neurological:     Mental Status: She is alert and oriented to person, place, and time.  Psychiatric:        Mood and Affect: Mood normal.       Results for orders placed or performed in visit on 10/30/24  POC COVID-19  Result Value Ref Range   SARS Coronavirus 2 Ag Negative Negative       Assessment & Plan:   Problem List Items Addressed This Visit       Cardiovascular and Mediastinum   Primary hypertension   BP ok today. RF sent to pharmacy       Relevant Medications   amLODipine  (NORVASC ) 2.5 MG tablet   lisinopril  (ZESTRIL ) 5 MG tablet   Chronic migraine without aura without status migrainosus, not intractable   Needs refills sent to pharmacy .        Relevant Medications   amLODipine  (NORVASC ) 2.5 MG tablet   Atogepant  (QULIPTA ) 30 MG  TABS   lisinopril  (ZESTRIL ) 5 MG tablet   gabapentin (NEURONTIN) 100 MG capsule     Musculoskeletal and Integument   Lumbar spondylosis   Relevant Medications   gabapentin (NEURONTIN) 100 MG capsule   DDD (degenerative disc disease), lumbar   Relevant Medications   gabapentin (NEURONTIN) 100 MG capsule   Other Visit Diagnoses       Acute cough    -  Primary   Relevant Medications   HYDROcodone  bit-homatropine (HYCODAN) 5-1.5 MG/5ML syrup   Other Relevant Orders   POC COVID-19 (Completed)       Assessment and Plan Assessment & Plan Chronic pain due to arthritis Chronic pain likely due to arthritis with inadequate relief from current medication. Previous nerve ablation effective for 1.5 to 2 years. Considered pregabalin or gabapentin for nighttime dosing to aid sleep and pain relief.  Discussed sedative effects and dose adjustment based on response and side effects. Emphasized starting low and titrating to avoid adverse effects. - Initiated pregabalin or gabapentin at bedtime, starting with one tablet and titrated up to three tablets as needed for pain relief and sleep. - Consider daytime dosing if nighttime dosing is insufficient. - Follow up with pain management specialist to assess medication efficacy and consider nerve ablation if needed. - Advised to contact insurance to determine if a referral is required for nerve ablation specialist.  Acute cough and pharyngitis Acute dry cough for three days with sore throat and raspy voice. No fever, some chills and sweats noted. Recent exposure to sick contacts.Boyfriend sick for 1.5 weeks.  - neg for COVID, likely viral - cough syrup sent to pharmacy since cough keeping her awake.       Meds ordered this encounter  Medications   amLODipine  (NORVASC ) 2.5 MG tablet    Sig: Take 1 tablet (2.5 mg total) by mouth daily.    Dispense:  90 tablet    Refill:  1   Atogepant  (QULIPTA ) 30 MG TABS    Sig: Take one tablet daily for migraine.    Dispense:  90 tablet    Refill:  3   lisinopril  (ZESTRIL ) 5 MG tablet    Sig: Take 1 tablet (5 mg total) by mouth daily.    Dispense:  90 tablet    Refill:  0   gabapentin (NEURONTIN) 100 MG capsule    Sig: Take 1-3 capsules (100-300 mg total) by mouth at bedtime.    Dispense:  90 capsule    Refill:  2   HYDROcodone  bit-homatropine (HYCODAN) 5-1.5 MG/5ML syrup    Sig: Take 5 mLs by mouth at bedtime as needed for cough.    Dispense:  120 mL    Refill:  0    Return in about 6 weeks (around 12/11/2024) for New start medication.  Dorothyann Byars, MD [facility] at [facility]

## 2024-10-30 NOTE — Assessment & Plan Note (Signed)
 BP ok today. RF sent to pharmacy

## 2024-11-02 ENCOUNTER — Other Ambulatory Visit: Payer: Self-pay | Admitting: Family Medicine

## 2024-11-02 ENCOUNTER — Encounter: Payer: Self-pay | Admitting: *Deleted

## 2024-11-02 ENCOUNTER — Ambulatory Visit: Payer: Self-pay

## 2024-11-02 ENCOUNTER — Encounter: Payer: Self-pay | Admitting: Family Medicine

## 2024-11-02 MED ORDER — AMOXICILLIN 875 MG PO TABS: 875.0000 mg | ORAL_TABLET | Freq: Two times a day (BID) | ORAL | 0 refills | Status: AC

## 2024-11-02 MED ORDER — AMOXICILLIN 875 MG PO TABS: 875.0000 mg | ORAL_TABLET | Freq: Two times a day (BID) | ORAL | 0 refills | Status: DC

## 2024-11-02 NOTE — Telephone Encounter (Signed)
 This encounter was created in error - please disregard.

## 2024-11-02 NOTE — Telephone Encounter (Signed)
 Copied from CRM [id number]. Topic: Clinical - Red Word Triage >> Nov 02, 2024 10:44 AM Angela Oliver wrote: Red Word that prompted transfer to Nurse Triage: patient was told to call if she has not been feeling better. Painful headaches and nasal drip with discoloration    ----------------------------------------------------------------------- From previous Reason for Contact - Medication Question: Reason for CRM:

## 2024-11-02 NOTE — Telephone Encounter (Signed)
 FYI Only or Action Required?: Action required by provider: update on patient condition and requesting antibiotics and promethazine  for coughing.  Patient was last seen in primary care on 10/30/2024 by Alvan Dorothyann BIRCH, MD.  Called Nurse Triage reporting Cough.  Symptoms began several days ago.  Interventions attempted: OTC medications: vicks, theraflu, tylenol  and Rest, hydration, or home remedies.  Symptoms are: gradually worsening.  Triage Disposition: See Physician Within 24 Hours  Patient/caregiver understands and will follow disposition?: No, wishes to speak with PCP    Last OV 10/30/24. Patient was told to call back if not feeling better. Dry cough now productive yellow- brown sputum. Nasal drainage yellow- brown in color. Headache persists. Chest pain after coughing. Please advise if another OV needed. Recommended if sx worsen call back or go to UC/ED for evaluation.         Reason for Disposition  [1] Continuous (nonstop) coughing interferes with work or school AND [2] no improvement using cough treatment per Care Advice  Answer Assessment - Initial Assessment Questions Requesting antibiotics and promethazine  for cough. Last OV 10/30/24, was told to call back if not feeling better.  Denies difficulty breathing but reports not able to breath through nose at times. Chest discomfort after coughing. No chest pain or difficulty breathing no dizziness.            1. ONSET: When did the cough begin?      On going since last OV 10/30/24 2. SEVERITY: How bad is the cough today?      Started to become productive started out dry cough  3. SPUTUM: Describe the color of your sputum (e.g., none, dry cough; clear, white, yellow, green)     Yellow brown in color 4. HEMOPTYSIS: Are you coughing up any blood? If Yes, ask: How much? (e.g., flecks, streaks, tablespoons, etc.)     Na  5. DIFFICULTY BREATHING: Are you having difficulty breathing? If Yes, ask:  How bad is it? (e.g., mild, moderate, severe)       Denies  6. FEVER: Do you have a fever? If Yes, ask: What is your temperature, how was it measured, and when did it start?     No  7. CARDIAC HISTORY: Do you have any history of heart disease? (e.g., heart attack, congestive heart failure)      See hx  8. LUNG HISTORY: Do you have any history of lung disease?  (e.g., pulmonary embolus, asthma, emphysema)     See hx  9. PE RISK FACTORS: Do you have a history of blood clots? (or: recent major surgery, recent prolonged travel, bedridden)     Na  10. OTHER SYMPTOMS: Do you have any other symptoms? (e.g., runny nose, wheezing, chest pain)       Headache, nasal drainage yellow brown in color, stuffy nose at times with not able to breath through nose only mouth. Chest discomfort after coughing and dry cough now productive at times. Yellow brown sputum.  11. PREGNANCY: Is there any chance you are pregnant? When was your last menstrual period?       na 12. TRAVEL: Have you traveled out of the country in the last month? (e.g., travel history, exposures)       na  Protocols used: Cough - Acute Productive-A-AH

## 2024-11-02 NOTE — Telephone Encounter (Signed)
 Meds ordered this encounter  Medications   amoxicillin  (AMOXIL ) 875 MG tablet    Sig: Take 1 tablet (875 mg total) by mouth 2 (two) times daily for 7 days.    Dispense:  14 tablet    Refill:  0

## 2024-11-02 NOTE — Telephone Encounter (Signed)
 Patient advised.

## 2024-11-02 NOTE — Telephone Encounter (Signed)
 This RN attempted to contact patient, no answer, left voicemail message. Will place in Call Back folder.

## 2024-11-28 ENCOUNTER — Encounter: Payer: Self-pay | Admitting: Physician Assistant

## 2024-11-29 ENCOUNTER — Encounter: Payer: Self-pay | Admitting: Family Medicine

## 2024-11-29 ENCOUNTER — Ambulatory Visit (INDEPENDENT_AMBULATORY_CARE_PROVIDER_SITE_OTHER): Admitting: Family Medicine

## 2024-11-29 ENCOUNTER — Ambulatory Visit (INDEPENDENT_AMBULATORY_CARE_PROVIDER_SITE_OTHER)

## 2024-11-29 VITALS — BP 114/52 | HR 65 | Temp 100.0°F | Resp 18 | Ht 64.0 in | Wt 195.0 lb

## 2024-11-29 DIAGNOSIS — J101 Influenza due to other identified influenza virus with other respiratory manifestations: Secondary | ICD-10-CM

## 2024-11-29 DIAGNOSIS — R051 Acute cough: Secondary | ICD-10-CM | POA: Diagnosis not present

## 2024-11-29 DIAGNOSIS — R599 Enlarged lymph nodes, unspecified: Secondary | ICD-10-CM | POA: Diagnosis not present

## 2024-11-29 LAB — POC COVID19/FLU A&B COMBO
Covid Antigen, POC: NEGATIVE
Influenza A Antigen, POC: POSITIVE — AB
Influenza B Antigen, POC: NEGATIVE

## 2024-11-29 MED ORDER — PROMETHAZINE-DM 6.25-15 MG/5ML PO SYRP: 5.0000 mL | ORAL_SOLUTION | Freq: Four times a day (QID) | ORAL | 0 refills | Status: DC | PRN

## 2024-11-29 MED ORDER — OSELTAMIVIR PHOSPHATE 75 MG PO CAPS: 75.0000 mg | ORAL_CAPSULE | Freq: Two times a day (BID) | ORAL | 0 refills | Status: DC

## 2024-11-29 NOTE — Telephone Encounter (Signed)
 Patient scheduled as nurse visit today for covid and flu and then dr. Alvan will quickly check patient as nurse visit.

## 2024-11-29 NOTE — Progress Notes (Signed)
 Acute Office Visit  Patient ID: Kyriaki Moder, female    DOB: [birth year], 58 y.o.   MRN: [id number]  PCP: Antoniette Vermell CROME, PA-C  Chief Complaint  Patient presents with   Nausea   Fatigue    Subjective:     HPI  Discussed the use of AI scribe software for clinical note transcription with the patient, who gave verbal consent to proceed.  History of Present Illness Carmeline Kowal is a 58 year old female who presents with acute onset of cough and sore throat.  Cough - Acute onset yesterday - Persistent, described as a 'constant feather in my throat' - Inhaler use did not alleviate symptoms - Associated with sensation of pressure in head due to coughing - Wheezing and squeaking throughout the night  Sore throat and lymphadenopathy - Sore and swollen throat - Swelling particularly under jaw - Bilateral glandular swelling  Constitutional symptoms - Low-grade fever last night - Current weakness - No significant chills  Neurological symptoms - Visual disturbance described as 'purple circles' - Sensation of impending syncope - Possible relation to low blood pressure on arrival  Gastrointestinal symptoms - No vomiting or diarrhea - No upset stomach  Hydration status - Consumed Theraflu and a bottle of water today   ROS     Objective:    BP (!) 114/52   Pulse 65   Temp 100 F (37.8 C) (Oral)   Resp 18   Ht 5' 4 (1.626 m)   Wt 195 lb (88.5 kg)   LMP 11/12/2016   BMI 33.47 kg/m     Physical Exam Constitutional:      Appearance: Normal appearance.  HENT:     Head: Normocephalic and atraumatic.     Right Ear: Tympanic membrane, ear canal and external ear normal. There is no impacted cerumen.     Left Ear: Tympanic membrane, ear canal and external ear normal. There is no impacted cerumen.     Nose: Nose normal.     Mouth/Throat:     Pharynx: Oropharynx is clear.  Eyes:     Conjunctiva/sclera: Conjunctivae normal.  Cardiovascular:     Rate and Rhythm:  Normal rate and regular rhythm.  Pulmonary:     Effort: Pulmonary effort is normal.     Breath sounds: Normal breath sounds.     Comments: Crackles at the left lung base.   Musculoskeletal:     Cervical back: Neck supple. No tenderness.  Lymphadenopathy:     Cervical: No cervical adenopathy.  Skin:    General: Skin is warm and dry.  Neurological:     Mental Status: She is alert and oriented to person, place, and time.  Psychiatric:        Mood and Affect: Mood normal.       Results for orders placed or performed in visit on 11/29/24  POC Covid19/Flu A&B Antigen  Result Value Ref Range   Influenza A Antigen, POC Positive (A) Negative   Influenza B Antigen, POC Negative Negative   Covid Antigen, POC Negative Negative       Assessment & Plan:   Problem List Items Addressed This Visit   None Visit Diagnoses       Acute cough    -  Primary   Relevant Orders   POC Covid19/Flu A&B Antigen (Completed)   DG Chest 2 View     Swollen gland       Relevant Orders   POC Covid19/Flu A&B Antigen (Completed)  Influenza A       Relevant Medications   oseltamivir  (TAMIFLU ) 75 MG capsule   promethazine -dextromethorphan (PROMETHAZINE -DM) 6.25-15 MG/5ML syrup   Other Relevant Orders   DG Chest 2 View       Assessment and Plan Assessment & Plan Acute viral pharyngitis/+ flu A Symptoms suggest viral infection, possibly influenza. Wheezing noted, inhaler ineffective. Low blood pressure indicates possible dehydration. - Provide supportive care, encourage hydration. - Consider influenza testing if symptoms persist or worsen. - Advise rest, avoid strenuous activities until improvement. - Tamiflu  and cough syrup sent to pharmacy     Meds ordered this encounter  Medications   oseltamivir  (TAMIFLU ) 75 MG capsule    Sig: Take 1 capsule (75 mg total) by mouth 2 (two) times daily.    Dispense:  10 capsule    Refill:  0   promethazine -dextromethorphan (PROMETHAZINE -DM) 6.25-15  MG/5ML syrup    Sig: Take 5 mLs by mouth 4 (four) times daily as needed for cough.    Dispense:  118 mL    Refill:  0    Return if symptoms worsen or fail to improve.  Dorothyann Byars, MD [facility] at [facility]

## 2024-11-29 NOTE — Telephone Encounter (Signed)
 Can you see if can come in at 2 today?

## 2024-12-02 ENCOUNTER — Ambulatory Visit: Payer: Self-pay | Admitting: Family Medicine

## 2024-12-03 MED ORDER — PREDNISONE 20 MG PO TABS: 40.0000 mg | ORAL_TABLET | Freq: Every day | ORAL | 0 refills | Status: DC

## 2024-12-03 NOTE — Telephone Encounter (Signed)
Meds ordered this encounter  Medications   predniSONE (DELTASONE) 20 MG tablet    Sig: Take 2 tablets (40 mg total) by mouth daily with breakfast.    Dispense:  10 tablet    Refill:  0

## 2024-12-14 ENCOUNTER — Ambulatory Visit (INDEPENDENT_AMBULATORY_CARE_PROVIDER_SITE_OTHER): Admitting: Physician Assistant

## 2024-12-14 ENCOUNTER — Encounter: Payer: Self-pay | Admitting: Physician Assistant

## 2024-12-14 VITALS — BP 155/83 | HR 83 | Ht 64.0 in | Wt 197.0 lb

## 2024-12-14 DIAGNOSIS — I10 Essential (primary) hypertension: Secondary | ICD-10-CM | POA: Diagnosis not present

## 2024-12-14 DIAGNOSIS — G43709 Chronic migraine without aura, not intractable, without status migrainosus: Secondary | ICD-10-CM

## 2024-12-14 DIAGNOSIS — E781 Pure hyperglyceridemia: Secondary | ICD-10-CM | POA: Diagnosis not present

## 2024-12-14 DIAGNOSIS — R7303 Prediabetes: Secondary | ICD-10-CM | POA: Diagnosis not present

## 2024-12-14 DIAGNOSIS — G894 Chronic pain syndrome: Secondary | ICD-10-CM | POA: Diagnosis not present

## 2024-12-14 DIAGNOSIS — J0140 Acute pansinusitis, unspecified: Secondary | ICD-10-CM | POA: Diagnosis not present

## 2024-12-14 DIAGNOSIS — R944 Abnormal results of kidney function studies: Secondary | ICD-10-CM

## 2024-12-14 DIAGNOSIS — R052 Subacute cough: Secondary | ICD-10-CM | POA: Diagnosis not present

## 2024-12-14 DIAGNOSIS — Z1329 Encounter for screening for other suspected endocrine disorder: Secondary | ICD-10-CM | POA: Diagnosis not present

## 2024-12-14 DIAGNOSIS — R058 Other specified cough: Secondary | ICD-10-CM | POA: Diagnosis not present

## 2024-12-14 MED ORDER — QULIPTA 30 MG PO TABS: ORAL_TABLET | ORAL | 3 refills | Status: AC

## 2024-12-14 MED ORDER — PREGABALIN 75 MG PO CAPS: 75.0000 mg | ORAL_CAPSULE | Freq: Two times a day (BID) | ORAL | 2 refills | Status: AC

## 2024-12-14 MED ORDER — DOXYCYCLINE HYCLATE 100 MG PO TABS: 100.0000 mg | ORAL_TABLET | Freq: Two times a day (BID) | ORAL | 0 refills | Status: DC

## 2024-12-14 MED ORDER — BENZONATATE 200 MG PO CAPS: 200.0000 mg | ORAL_CAPSULE | Freq: Two times a day (BID) | ORAL | 0 refills | Status: AC | PRN

## 2024-12-14 MED ORDER — PROMETHAZINE-DM 6.25-15 MG/5ML PO SYRP: 5.0000 mL | ORAL_SOLUTION | Freq: Four times a day (QID) | ORAL | 0 refills | Status: AC | PRN

## 2024-12-14 MED ORDER — PREDNISONE 20 MG PO TABS: ORAL_TABLET | ORAL | 0 refills | Status: AC

## 2024-12-14 MED ORDER — QVAR REDIHALER 80 MCG/ACT IN AERB: 2.0000 | INHALATION_SPRAY | Freq: Two times a day (BID) | RESPIRATORY_TRACT | 3 refills | Status: AC

## 2024-12-14 NOTE — Progress Notes (Signed)
 "  Established Patient Office Visit  Subjective   Patient ID: Angela Oliver, female    DOB: 09-Jan-1966  Age: 59 y.o. MRN: [id number]  Chief Complaint  Patient presents with   Medical Management of Chronic Issues    HPI .Discussed the use of AI scribe software for clinical note transcription with the patient, who gave verbal consent to proceed.  History of Present Illness Angela Oliver is a 59 year old female who presents with persistent symptoms following a flu infection.  Upper respiratory symptoms - Persistent cough since influenza type A infection around December 14th - Nasal congestion with yellow discharge - Sore throat - Night sweats  Musculoskeletal pain - Burning sensation in the back, described as 'incredible', radiating down the left leg - Pain onset after waking up - Pain exacerbated by bending - Suspects pain may be related to coughing - No use of muscle relaxers  Medication response - Antibiotic course for five to seven days prior to flu onset, with some improvement - Prednisone  improved breathing - Hydrocodone  cough syrup ineffective - Promethazine  helpful for cough - Gabapentin  used for pain but caused insomnia   ROS See HPI.    Objective:     BP (!) 155/83   Pulse 83   Ht 5' 4 (1.626 m)   Wt 197 lb (89.4 kg)   LMP 11/12/2016   SpO2 98%   BMI 33.81 kg/m  BP Readings from Last 3 Encounters:  12/14/24 (!) 155/83  11/29/24 (!) 114/52  10/30/24 136/84   Wt Readings from Last 3 Encounters:  12/14/24 197 lb (89.4 kg)  11/29/24 195 lb (88.5 kg)  10/30/24 195 lb (88.5 kg)      Physical Exam Constitutional:      Appearance: Normal appearance. She is obese.  HENT:     Head: Normocephalic.     Comments: Sinus tenderness to palpation over maxillary sinuses.     Right Ear: Tympanic membrane, ear canal and external ear normal. There is no impacted cerumen.     Left Ear: Tympanic membrane, ear canal and external ear normal. There is no impacted  cerumen.     Nose: Nose normal.     Mouth/Throat:     Mouth: Mucous membranes are moist.  Eyes:     Conjunctiva/sclera: Conjunctivae normal.  Cardiovascular:     Rate and Rhythm: Normal rate and regular rhythm.  Pulmonary:     Effort: Pulmonary effort is normal.     Breath sounds: Normal breath sounds.  Musculoskeletal:     Cervical back: Normal range of motion and neck supple.     Right lower leg: No edema.     Left lower leg: No edema.  Lymphadenopathy:     Cervical: Cervical adenopathy present.  Neurological:     General: No focal deficit present.     Mental Status: She is alert and oriented to person, place, and time.  Psychiatric:        Mood and Affect: Mood normal.      The 10-year ASCVD risk score (Arnett DK, et al., 2019) is: 9.7%    Assessment & Plan:  Angela Oliver was seen today for medical management of chronic issues.  Diagnoses and all orders for this visit:  Acute non-recurrent pansinusitis -     CMP14+EGFR -     predniSONE  (DELTASONE ) 20 MG tablet; Take 2 tablets twice a day for 5 days. -     Discontinue: doxycycline  (VIBRA -TABS) 100 MG tablet; Take 1 tablet (100 mg total)  by mouth 2 (two) times daily.  Chronic migraine without aura without status migrainosus, not intractable -     HgB A1c -     Lipid panel -     CBC with Differential/Platelet -     TSH -     CMP14+EGFR -     Atogepant  (QULIPTA ) 30 MG TABS; Take one tablet daily for migraine.  Primary hypertension -     CMP14+EGFR  Subacute cough -     beclomethasone (QVAR  REDIHALER) 80 MCG/ACT inhaler; Inhale 2 puffs into the lungs 2 (two) times daily. -     promethazine -dextromethorphan (PROMETHAZINE -DM) 6.25-15 MG/5ML syrup; Take 5 mLs by mouth 4 (four) times daily as needed for cough.  Chronic pain syndrome -     pregabalin  (LYRICA ) 75 MG capsule; Take 1 capsule (75 mg total) by mouth 2 (two) times daily.  Hypertriglyceridemia -     Lipid panel  Pre-diabetes -     HgB A1c  Decreased  GFR -     CMP14+EGFR -     Specimen status report  Thyroid  disorder screening -     TSH -     T4, free -     T3, free -     Thyroid  peroxidase antibody -     Specimen status report  Post-viral cough syndrome -     beclomethasone (QVAR  REDIHALER) 80 MCG/ACT inhaler; Inhale 2 puffs into the lungs 2 (two) times daily. -     promethazine -dextromethorphan (PROMETHAZINE -DM) 6.25-15 MG/5ML syrup; Take 5 mLs by mouth 4 (four) times daily as needed for cough. -     predniSONE  (DELTASONE ) 20 MG tablet; Take 2 tablets twice a day for 5 days. -     benzonatate  (TESSALON ) 200 MG capsule; Take 1 capsule (200 mg total) by mouth 2 (two) times daily as needed for cough.   Assessment & Plan Post-viral cough with secondary acute pansinusitis Persistent cough and congestion post-viral infection, likely post-viral cough with secondary acute pansinusitis. Previous prednisone  treatment provided relief, indicating inflammation. - Prescribed doxycycline  for 10 days for sinusitis. - Prescribed prednisone  to reduce inflammation and open airways. - Prescribed Tessalon  Perles for cough management. - Refilled promethazine  cough syrup for use as needed.  Acute low back pain with left leg radiculopathy Acute low back pain with left leg radiculopathy, likely due to nerve irritation possibly exacerbated by coughing. Differential includes disc herniation or nerve impingement. - Prescribed pregabalin  75 mg twice daily for pain management. - Advised use of heating pad and icy hot patches for symptomatic relief. - Recommended stretching exercises such as cat and cow stretches.  Chronic migraine Chronic migraines managed with Qulipta . - Refilled Qulipta  prescription.  Chronic pain syndrome Managed with medication adjustments. Gabapentin  was ineffective and caused insomnia. - Discontinued gabapentin . - Initiated pregabalin  75 mg twice daily.     Return in about 2 months (around 02/11/2025).    Angela Grunow,  PA-C  "

## 2024-12-15 LAB — CMP14+EGFR
ALT: 51 IU/L — ABNORMAL HIGH (ref 0–32)
AST: 30 IU/L (ref 0–40)
Albumin: 4.2 g/dL (ref 3.8–4.9)
Alkaline Phosphatase: 93 IU/L (ref 49–135)
BUN/Creatinine Ratio: 18 (ref 12–28)
BUN: 15 mg/dL (ref 8–27)
Bilirubin Total: 0.7 mg/dL (ref 0.0–1.2)
CO2: 24 mmol/L (ref 20–29)
Calcium: 9.6 mg/dL (ref 8.7–10.3)
Chloride: 103 mmol/L (ref 96–106)
Creatinine, Ser: 0.84 mg/dL (ref 0.57–1.00)
Globulin, Total: 2.5 g/dL (ref 1.5–4.5)
Glucose: 95 mg/dL (ref 70–99)
Potassium: 4.6 mmol/L (ref 3.5–5.2)
Sodium: 140 mmol/L (ref 134–144)
Total Protein: 6.7 g/dL (ref 6.0–8.5)
eGFR: 80 mL/min/1.73

## 2024-12-15 LAB — CBC WITH DIFFERENTIAL/PLATELET
Basophils Absolute: 0.1 x10E3/uL (ref 0.0–0.2)
Basos: 1 %
EOS (ABSOLUTE): 0.2 x10E3/uL (ref 0.0–0.4)
Eos: 3 %
Hematocrit: 40.5 % (ref 34.0–46.6)
Hemoglobin: 13.4 g/dL (ref 11.1–15.9)
Immature Grans (Abs): 0.1 x10E3/uL (ref 0.0–0.1)
Immature Granulocytes: 1 %
Lymphocytes Absolute: 3 x10E3/uL (ref 0.7–3.1)
Lymphs: 41 %
MCH: 30.8 pg (ref 26.6–33.0)
MCHC: 33.1 g/dL (ref 31.5–35.7)
MCV: 93 fL (ref 79–97)
Monocytes Absolute: 0.6 x10E3/uL (ref 0.1–0.9)
Monocytes: 8 %
Neutrophils Absolute: 3.4 x10E3/uL (ref 1.4–7.0)
Neutrophils: 46 %
Platelets: 298 x10E3/uL (ref 150–450)
RBC: 4.35 x10E6/uL (ref 3.77–5.28)
RDW: 12.9 % (ref 11.7–15.4)
WBC: 7.4 x10E3/uL (ref 3.4–10.8)

## 2024-12-15 LAB — LIPID PANEL
Chol/HDL Ratio: 6.4 ratio — ABNORMAL HIGH (ref 0.0–4.4)
Cholesterol, Total: 270 mg/dL — ABNORMAL HIGH (ref 100–199)
HDL: 42 mg/dL
LDL Chol Calc (NIH): 138 mg/dL — ABNORMAL HIGH (ref 0–99)
Triglycerides: 489 mg/dL — ABNORMAL HIGH (ref 0–149)
VLDL Cholesterol Cal: 90 mg/dL — ABNORMAL HIGH (ref 5–40)

## 2024-12-15 LAB — HEMOGLOBIN A1C
Est. average glucose Bld gHb Est-mCnc: 123 mg/dL
Hgb A1c MFr Bld: 5.9 % — ABNORMAL HIGH (ref 4.8–5.6)

## 2024-12-15 LAB — TSH: TSH: 6.96 u[IU]/mL — ABNORMAL HIGH (ref 0.450–4.500)

## 2024-12-17 ENCOUNTER — Ambulatory Visit: Payer: Self-pay | Admitting: Physician Assistant

## 2024-12-17 DIAGNOSIS — R7401 Elevation of levels of liver transaminase levels: Secondary | ICD-10-CM

## 2024-12-17 NOTE — Progress Notes (Signed)
 Tamy,   WBC normal range.  Hemoglobin looks good.  Kidney and glucose looks good.  ALT, liver enzyme, elevated. Avoid tylenol  and alcohol and recheck in 2 weeks.  A1C trending up from 1 year ago. In pre-diabetes range.  Avoid sugars/carbs and regular exercise can help this not progress to diabetes.  TSH is elevated. Will order free T4, T3 and anti-TPO. Trending towards low thyroid  hormone.   Cholesterol not to goal: LDL high and TG very high. Suggest for you to start crestor  to help lower numbers and prevent cardiovascular events. 10 year cardiovascular risk is 9.6 percent. Above 7.5 percent we suggest to starting a statin drug.   .The 10-year ASCVD risk score (Arnett DK, et al., 2019) is: 9.7%   Values used to calculate the score:     Age: 59 years     Clinically relevant sex: Female     Is Non-Hispanic African American: No     Diabetic: No     Tobacco smoker: No     Systolic Blood Pressure: 155 mmHg     Is BP treated: Yes     HDL Cholesterol: 42 mg/dL     Total Cholesterol: 270 mg/dL

## 2024-12-17 NOTE — Progress Notes (Signed)
"  Chest x-ray normal  "

## 2024-12-18 ENCOUNTER — Other Ambulatory Visit: Payer: Self-pay | Admitting: Physician Assistant

## 2024-12-18 LAB — T3, FREE: T3, Free: 3.4 pg/mL (ref 2.0–4.4)

## 2024-12-18 LAB — THYROID PEROXIDASE ANTIBODY: Thyroperoxidase Ab SerPl-aCnc: 191 [IU]/mL — ABNORMAL HIGH (ref 0–34)

## 2024-12-18 LAB — T4, FREE: Free T4: 0.81 ng/dL — ABNORMAL LOW (ref 0.82–1.77)

## 2024-12-18 LAB — SPECIMEN STATUS REPORT

## 2024-12-18 MED ORDER — METFORMIN HCL ER 500 MG PO TB24: 500.0000 mg | ORAL_TABLET | Freq: Every day | ORAL | 2 refills | Status: AC

## 2024-12-18 MED ORDER — ROSUVASTATIN CALCIUM 10 MG PO TABS: 10.0000 mg | ORAL_TABLET | Freq: Every day | ORAL | 3 refills | Status: AC

## 2024-12-20 ENCOUNTER — Other Ambulatory Visit: Payer: Self-pay | Admitting: Physician Assistant

## 2024-12-20 DIAGNOSIS — K219 Gastro-esophageal reflux disease without esophagitis: Secondary | ICD-10-CM

## 2024-12-21 MED ORDER — HYDROCODONE-ACETAMINOPHEN 5-325 MG PO TABS: 1.0000 | ORAL_TABLET | Freq: Four times a day (QID) | ORAL | 0 refills | Status: AC | PRN

## 2024-12-21 NOTE — Addendum Note (Signed)
 Addended by: ANTONIETTE VERMELL CROME on: 12/21/2024 04:37 PM   Modules accepted: Orders

## 2024-12-30 ENCOUNTER — Encounter: Payer: Self-pay | Admitting: Physician Assistant

## 2025-01-10 ENCOUNTER — Other Ambulatory Visit: Payer: Self-pay | Admitting: Physician Assistant

## 2025-01-10 DIAGNOSIS — M51369 Other intervertebral disc degeneration, lumbar region without mention of lumbar back pain or lower extremity pain: Secondary | ICD-10-CM

## 2025-01-10 NOTE — Telephone Encounter (Signed)
 Requesting rx rf of Duloxetine  60mg   Last written 07/24/2024 90 day supply and one refill  Last OV 12/14/2024 Upcoming appt 02/11/2025  Refilled per protocol

## 2025-02-11 ENCOUNTER — Ambulatory Visit: Admitting: Physician Assistant
